# Patient Record
Sex: Male | Born: 1935 | Race: White | Hispanic: No | Smoking: Former smoker
Health system: Southern US, Community
[De-identification: ages and names within clinical notes are randomized; demographics above are authoritative.]

## PROBLEM LIST (undated history)

## (undated) DIAGNOSIS — G4700 Insomnia, unspecified: Secondary | ICD-10-CM

## (undated) DIAGNOSIS — I739 Peripheral vascular disease, unspecified: Secondary | ICD-10-CM

## (undated) DIAGNOSIS — N183 Chronic kidney disease, stage 3 unspecified: Secondary | ICD-10-CM

## (undated) DIAGNOSIS — E785 Hyperlipidemia, unspecified: Secondary | ICD-10-CM

## (undated) DIAGNOSIS — I679 Cerebrovascular disease, unspecified: Secondary | ICD-10-CM

## (undated) DIAGNOSIS — N529 Male erectile dysfunction, unspecified: Secondary | ICD-10-CM

## (undated) DIAGNOSIS — I2510 Atherosclerotic heart disease of native coronary artery without angina pectoris: Secondary | ICD-10-CM

## (undated) DIAGNOSIS — Z8739 Personal history of other diseases of the musculoskeletal system and connective tissue: Secondary | ICD-10-CM

## (undated) DIAGNOSIS — C44311 Basal cell carcinoma of skin of nose: Secondary | ICD-10-CM

## (undated) DIAGNOSIS — I10 Essential (primary) hypertension: Secondary | ICD-10-CM

## (undated) DIAGNOSIS — R55 Syncope and collapse: Secondary | ICD-10-CM

## (undated) DIAGNOSIS — I219 Acute myocardial infarction, unspecified: Secondary | ICD-10-CM

## (undated) DIAGNOSIS — R0989 Other specified symptoms and signs involving the circulatory and respiratory systems: Secondary | ICD-10-CM

## (undated) DIAGNOSIS — I779 Disorder of arteries and arterioles, unspecified: Secondary | ICD-10-CM

## (undated) HISTORY — DX: Syncope and collapse: R55

## (undated) HISTORY — PX: APPENDECTOMY: SHX54

## (undated) HISTORY — PX: HERNIA REPAIR: SHX51

## (undated) HISTORY — PX: ABDOMINAL HERNIA REPAIR: SHX539

## (undated) HISTORY — PX: CAROTID ENDARTERECTOMY: SUR193

## (undated) HISTORY — DX: Male erectile dysfunction, unspecified: N52.9

## (undated) HISTORY — DX: Peripheral vascular disease, unspecified: I73.9

## (undated) HISTORY — DX: Cerebrovascular disease, unspecified: I67.9

## (undated) HISTORY — DX: Insomnia, unspecified: G47.00

## (undated) HISTORY — DX: Atherosclerotic heart disease of native coronary artery without angina pectoris: I25.10

## (undated) HISTORY — PX: CORONARY ANGIOPLASTY WITH STENT PLACEMENT: SHX49

---

## 1998-12-09 ENCOUNTER — Emergency Department (HOSPITAL_COMMUNITY): Admission: EM | Admit: 1998-12-09 | Discharge: 1998-12-09 | Payer: Self-pay | Admitting: Emergency Medicine

## 1998-12-10 ENCOUNTER — Encounter: Payer: Self-pay | Admitting: Emergency Medicine

## 2000-03-13 DIAGNOSIS — I219 Acute myocardial infarction, unspecified: Secondary | ICD-10-CM

## 2000-03-13 HISTORY — PX: CORONARY ARTERY BYPASS GRAFT: SHX141

## 2000-03-13 HISTORY — DX: Acute myocardial infarction, unspecified: I21.9

## 2000-06-14 ENCOUNTER — Encounter: Payer: Self-pay | Admitting: Emergency Medicine

## 2000-06-14 ENCOUNTER — Inpatient Hospital Stay (HOSPITAL_COMMUNITY): Admission: EM | Admit: 2000-06-14 | Discharge: 2000-06-17 | Payer: Self-pay | Admitting: Emergency Medicine

## 2000-10-23 ENCOUNTER — Inpatient Hospital Stay (HOSPITAL_COMMUNITY): Admission: AD | Admit: 2000-10-23 | Discharge: 2000-10-30 | Payer: Self-pay | Admitting: Internal Medicine

## 2000-10-23 ENCOUNTER — Encounter: Payer: Self-pay | Admitting: Internal Medicine

## 2000-10-25 ENCOUNTER — Encounter: Payer: Self-pay | Admitting: Surgery

## 2000-10-26 ENCOUNTER — Encounter: Payer: Self-pay | Admitting: Surgery

## 2000-10-27 ENCOUNTER — Encounter: Payer: Self-pay | Admitting: Surgery

## 2000-10-28 ENCOUNTER — Encounter: Payer: Self-pay | Admitting: Thoracic Surgery (Cardiothoracic Vascular Surgery)

## 2011-08-24 ENCOUNTER — Encounter (HOSPITAL_COMMUNITY): Payer: Self-pay | Admitting: *Deleted

## 2011-08-24 ENCOUNTER — Emergency Department (HOSPITAL_COMMUNITY): Payer: Medicare Other

## 2011-08-24 ENCOUNTER — Inpatient Hospital Stay (HOSPITAL_COMMUNITY)
Admission: EM | Admit: 2011-08-24 | Discharge: 2011-08-28 | DRG: 372 | Disposition: A | Discharge status: Home / Self Care | Payer: Medicare Other | Attending: Internal Medicine | Admitting: Internal Medicine

## 2011-08-24 DIAGNOSIS — N179 Acute kidney failure, unspecified: Secondary | ICD-10-CM

## 2011-08-24 DIAGNOSIS — E876 Hypokalemia: Secondary | ICD-10-CM | POA: Diagnosis present

## 2011-08-24 DIAGNOSIS — Z7982 Long term (current) use of aspirin: Secondary | ICD-10-CM

## 2011-08-24 DIAGNOSIS — Z87891 Personal history of nicotine dependence: Secondary | ICD-10-CM

## 2011-08-24 DIAGNOSIS — I959 Hypotension, unspecified: Secondary | ICD-10-CM

## 2011-08-24 DIAGNOSIS — A0472 Enterocolitis due to Clostridium difficile, not specified as recurrent: Principal | ICD-10-CM | POA: Diagnosis present

## 2011-08-24 DIAGNOSIS — Z951 Presence of aortocoronary bypass graft: Secondary | ICD-10-CM

## 2011-08-24 DIAGNOSIS — R112 Nausea with vomiting, unspecified: Secondary | ICD-10-CM | POA: Diagnosis present

## 2011-08-24 DIAGNOSIS — E860 Dehydration: Secondary | ICD-10-CM | POA: Diagnosis present

## 2011-08-24 DIAGNOSIS — Z79899 Other long term (current) drug therapy: Secondary | ICD-10-CM

## 2011-08-24 DIAGNOSIS — D649 Anemia, unspecified: Secondary | ICD-10-CM | POA: Diagnosis present

## 2011-08-24 DIAGNOSIS — K529 Noninfective gastroenteritis and colitis, unspecified: Secondary | ICD-10-CM

## 2011-08-24 DIAGNOSIS — I2510 Atherosclerotic heart disease of native coronary artery without angina pectoris: Secondary | ICD-10-CM | POA: Diagnosis present

## 2011-08-24 DIAGNOSIS — R197 Diarrhea, unspecified: Secondary | ICD-10-CM | POA: Diagnosis present

## 2011-08-24 DIAGNOSIS — E785 Hyperlipidemia, unspecified: Secondary | ICD-10-CM | POA: Diagnosis present

## 2011-08-24 DIAGNOSIS — I10 Essential (primary) hypertension: Secondary | ICD-10-CM | POA: Diagnosis present

## 2011-08-24 HISTORY — DX: Hyperlipidemia, unspecified: E78.5

## 2011-08-24 HISTORY — DX: Essential (primary) hypertension: I10

## 2011-08-24 LAB — CBC
HCT: 33.3 % — ABNORMAL LOW (ref 39.0–52.0)
MCH: 31.5 pg (ref 26.0–34.0)
MCHC: 33 g/dL (ref 30.0–36.0)
MCV: 95.4 fL (ref 78.0–100.0)
RDW: 13.5 % (ref 11.5–15.5)

## 2011-08-24 LAB — AMYLASE: Amylase: 74 U/L (ref 0–105)

## 2011-08-24 LAB — URINALYSIS, ROUTINE W REFLEX MICROSCOPIC
Nitrite: NEGATIVE
Specific Gravity, Urine: 1.02 (ref 1.005–1.030)
Urobilinogen, UA: 0.2 mg/dL (ref 0.0–1.0)

## 2011-08-24 LAB — LIPASE, BLOOD: Lipase: 16 U/L (ref 11–59)

## 2011-08-24 LAB — BASIC METABOLIC PANEL
BUN: 31 mg/dL — ABNORMAL HIGH (ref 6–23)
Chloride: 104 mEq/L (ref 96–112)
Creatinine, Ser: 2.41 mg/dL — ABNORMAL HIGH (ref 0.50–1.35)
GFR calc non Af Amer: 23 mL/min — ABNORMAL LOW (ref >90)
Glucose, Bld: 139 mg/dL — ABNORMAL HIGH (ref 70–99)
Sodium: 136 mEq/L (ref 135–145)

## 2011-08-24 LAB — SODIUM, URINE, RANDOM: Sodium, Ur: 26 mEq/L

## 2011-08-24 LAB — COMPREHENSIVE METABOLIC PANEL
Albumin: 2.8 g/dL — ABNORMAL LOW (ref 3.5–5.2)
BUN: 38 mg/dL — ABNORMAL HIGH (ref 6–23)
Chloride: 99 mEq/L (ref 96–112)
Creatinine, Ser: 3.45 mg/dL — ABNORMAL HIGH (ref 0.50–1.35)
Total Bilirubin: 0.4 mg/dL (ref 0.3–1.2)
Total Protein: 6.5 g/dL (ref 6.0–8.3)

## 2011-08-24 MED ORDER — SODIUM CHLORIDE 0.9 % IV BOLUS (SEPSIS)
1000.0000 mL | Freq: Once | INTRAVENOUS | Status: AC
  Administered 2011-08-24: 1000 mL via INTRAVENOUS

## 2011-08-24 MED ORDER — ALBUTEROL SULFATE (5 MG/ML) 0.5% IN NEBU: 2.5000 mg | INHALATION_SOLUTION | RESPIRATORY_TRACT | Status: DC | PRN

## 2011-08-24 MED ORDER — SODIUM CHLORIDE 0.9 % IJ SOLN
3.0000 mL | Freq: Two times a day (BID) | INTRAMUSCULAR | Status: DC
  Administered 2011-08-24 – 2011-08-27 (×5): 3 mL via INTRAVENOUS

## 2011-08-24 MED ORDER — ONDANSETRON HCL 4 MG/2ML IJ SOLN
4.0000 mg | Freq: Once | INTRAMUSCULAR | Status: AC
  Administered 2011-08-24: 4 mg via INTRAVENOUS
  Filled 2011-08-24: qty 2

## 2011-08-24 MED ORDER — HEPARIN SODIUM (PORCINE) 5000 UNIT/ML IJ SOLN
5000.0000 [IU] | Freq: Three times a day (TID) | INTRAMUSCULAR | Status: DC
  Administered 2011-08-24 – 2011-08-28 (×11): 5000 [IU] via SUBCUTANEOUS
  Filled 2011-08-24 (×15): qty 1

## 2011-08-24 MED ORDER — ATORVASTATIN CALCIUM 40 MG PO TABS
40.0000 mg | ORAL_TABLET | Freq: Every day | ORAL | Status: DC
  Administered 2011-08-24 – 2011-08-27 (×4): 40 mg via ORAL
  Filled 2011-08-24 (×5): qty 1

## 2011-08-24 MED ORDER — ASPIRIN EC 81 MG PO TBEC
81.0000 mg | DELAYED_RELEASE_TABLET | Freq: Every day | ORAL | Status: DC
  Administered 2011-08-25 – 2011-08-27 (×3): 81 mg via ORAL
  Filled 2011-08-24 (×4): qty 1

## 2011-08-24 MED ORDER — SODIUM CHLORIDE 0.9 % IV SOLN
INTRAVENOUS | Status: DC
  Administered 2011-08-24 – 2011-08-27 (×7): via INTRAVENOUS

## 2011-08-24 MED ORDER — NITROGLYCERIN 0.4 MG SL SUBL: 0.4000 mg | SUBLINGUAL_TABLET | SUBLINGUAL | Status: DC | PRN

## 2011-08-24 MED ORDER — ONDANSETRON HCL 4 MG PO TABS: 4.0000 mg | ORAL_TABLET | Freq: Four times a day (QID) | ORAL | Status: DC | PRN

## 2011-08-24 MED ORDER — SODIUM CHLORIDE 0.9 % IV BOLUS (SEPSIS): 1000.0000 mL | Freq: Once | INTRAVENOUS | Status: DC

## 2011-08-24 MED ORDER — ACETAMINOPHEN 650 MG RE SUPP: 650.0000 mg | Freq: Four times a day (QID) | RECTAL | Status: DC | PRN

## 2011-08-24 MED ORDER — POTASSIUM CHLORIDE CRYS ER 20 MEQ PO TBCR
20.0000 meq | EXTENDED_RELEASE_TABLET | Freq: Once | ORAL | Status: AC
  Administered 2011-08-27: 20 meq via ORAL
  Filled 2011-08-24 (×2): qty 1

## 2011-08-24 MED ORDER — ONDANSETRON HCL 4 MG/2ML IJ SOLN: 4.0000 mg | Freq: Four times a day (QID) | INTRAMUSCULAR | Status: DC | PRN

## 2011-08-24 MED ORDER — ACETAMINOPHEN 325 MG PO TABS: 650.0000 mg | ORAL_TABLET | Freq: Four times a day (QID) | ORAL | Status: DC | PRN

## 2011-08-24 NOTE — ED Notes (Signed)
Dr. Christy Gentles aware of bp 78/46. 3rd liter of fluid ordered. Patient placed in trendelenburg. Will hang once second liter is complete.

## 2011-08-24 NOTE — ED Provider Notes (Signed)
History     CSN: [id number]  Arrival date & time 08/24/11  1150   First MD Initiated Contact with Patient 08/24/11 1245      Chief Complaint  Patient presents with  . Nausea  . Emesis  . Dizziness     Patient is a 78 y.o. male presenting with vomiting. The history is provided by the patient.  Emesis  This is a new problem. The current episode started more than 2 days ago. The problem occurs 2 to 4 times per day. The problem has been gradually worsening. The emesis has an appearance of stomach contents. There has been no fever. Associated symptoms include abdominal pain, chills and diarrhea. Pertinent negatives include no fever.  eating worsens vomiting Nothing improves symptoms  Pt presents from home for vomiting and diarrhea (nonbloody) for past 2-3 days No cp/sob Also reports abdominal cramping No fever No syncope but does report dizziness Unknown cause of his symptoms  PCP - Dr Laurann Montana  Past Medical History  Diagnosis Date  . Hypertension     Surgical history - CABG  History reviewed. No pertinent family history.  History  Substance Use Topics  . Smoking status: Never Smoker   . Smokeless tobacco: Not on file  . Alcohol Use: No      Review of Systems  Constitutional: Positive for chills. Negative for fever.  Respiratory: Negative for shortness of breath.   Gastrointestinal: Positive for vomiting, abdominal pain and diarrhea.  Skin: Negative for rash.  Neurological: Positive for weakness.  All other systems reviewed and are negative.    Allergies  Review of patient's allergies indicates no known allergies.  Home Medications   Current Outpatient Rx  Name Route Sig Dispense Refill  . AMITRIPTYLINE HCL 25 MG PO TABS Oral Take 25 mg by mouth at bedtime.    Marland Kitchen AMLODIPINE BESYLATE 10 MG PO TABS Oral Take 10 mg by mouth daily.    . ASPIRIN EC 81 MG PO TBEC Oral Take 81 mg by mouth daily.    . ATORVASTATIN CALCIUM 40 MG PO TABS Oral Take 40 mg by mouth  daily.    Marland Kitchen LISINOPRIL 20 MG PO TABS Oral Take 20 mg by mouth daily.    Marland Kitchen METOPROLOL TARTRATE 50 MG PO TABS Oral Take 50 mg by mouth 2 (two) times daily.    Marland Kitchen NITROGLYCERIN 0.4 MG SL SUBL Sublingual Place 0.4 mg under the tongue every 5 (five) minutes as needed. For cheat pain.      BP 91/31  Pulse 67  Temp 98 F (36.7 C) (Oral)  Resp 18  SpO2 100%  Physical Exam CONSTITUTIONAL: Well developed/well nourished HEAD AND FACE: Normocephalic/atraumatic EYES: EOMI/PERRL, no icterus ENMT: Mucous membranes dry NECK: supple no meningeal signs SPINE:entire spine nontender CV: S1/S2 noted, no murmurs/rubs/gallops noted LUNGS: Lungs are clear to auscultation bilaterally, no apparent distress ABDOMEN: soft, nontender, no rebound or guarding GU:no cva tenderness NEURO: Pt is awake/alert, moves all extremitiesx4 EXTREMITIES: pulses normal, full ROM SKIN: warm, color normal PSYCH: no abnormalities of mood noted  ED Course  Procedures   CRITICAL CARE Performed by: Sharyon Cable   Total critical care time: 45  Critical care time was exclusive of separately billable procedures and treating other patients.  Critical care was necessary to treat or prevent imminent or life-threatening deterioration.  Critical care was time spent personally by me on the following activities: development of treatment plan with patient and/or surrogate as well as nursing, discussions with consultants, evaluation of  patient's response to treatment, examination of patient, obtaining history from patient or surrogate, ordering and performing treatments and interventions, ordering and review of laboratory studies, ordering and review of radiographic studies, pulse oximetry and re-evaluation of patient's condition.   Labs Reviewed  CBC - Abnormal; Notable for the following:    WBC 11.7 (*)     RBC 3.49 (*)     Hemoglobin 11.0 (*)     HCT 33.3 (*)     All other components within normal limits  COMPREHENSIVE  METABOLIC PANEL - Abnormal; Notable for the following:    Potassium 3.3 (*)     Glucose, Bld 159 (*)     BUN 38 (*)     Creatinine, Ser 3.45 (*)     Calcium 8.2 (*)     Albumin 2.8 (*)     GFR calc non Af Amer 15 (*)     GFR calc Af Amer 18 (*)     All other components within normal limits  AMYLASE  LIPASE, BLOOD  URINALYSIS, ROUTINE W REFLEX MICROSCOPIC  LACTIC ACID, PLASMA    1:50 PM Pt with recent vomiting/diarrhea now hypotensive and appears dehydrated Abdomen soft on my exam Will follow closely 3:29 PM Pt had one drop in BP to 70s, I then repeated 3 separate pressures >97 He is awake/alert.  His abdomen soft and no tenderness He has had vomiting and diarrhea.  He reports that he has continued to take his BP meds.  Suspect his low bp is combination of dehydration and also his BP meds.  I doubt acute abdomen.  He reports that he has had some SOB recently prior to today, and coarse BS noted in bases (no rales) will get CXR. He is on his third liter of IV fluids He is nontoxic  Will call for admission 4:21 PM D/w dr Algis Liming, to admit to stepdown for renal failure/dehydration Pt stabilized in the ED On my last check SBP >100 He is awake/alert nontoxic in appearance Do not feel imaging of abdomen is warranted  MDM  Nursing notes including past medical history and social history reviewed and considered in documentation All labs/vitals reviewed and considered xrays reviewed and considered        Date: 08/24/2011  Rate: 70  Rhythm: normal sinus rhythm  QRS Axis: normal  Intervals: normal  ST/T Wave abnormalities: nonspecific ST changes  Conduction Disutrbances:none     Sharyon Cable, MD 08/24/11 1623

## 2011-08-24 NOTE — ED Notes (Signed)
Pt reports n/v/d since Sunday associated with dizziness. Report this am blood pressure systolic was in the 0000000.

## 2011-08-24 NOTE — H&P (Addendum)
PCP:   Danny Shelling, MD   Chief Complaint:  Nausea, vomiting and diarrhea.  HPI: 78 year old male Danny [name] with history of hypertension, hyperlipidemia, CAD status post CABG, bilateral carotid endarterectomy who lives independently and is fairly active presents with four-day history of nausea, vomiting and diarrhea. He indicates that he was in his usual state of health until Sunday night when he started having subacute onset of nausea, vomiting and diarrhea. Since then he has had multiple episodes daily. He indicates up to 10 times of emesis and diarrhea every day. The vomitus consists of brown-colored liquid without coffee ground or blood. Decreased appetite and has barely been able to take anything by mouth. Last time he vomited was yesterday. He has been able to consume sips of liquids today. Intermittent lower abdominal mild pain which has resolved. Stools are liquid brown in color without mucus or blood in not foul-smelling. No chills and may have had low-grade fevers but did not check temperatures. His urine output he says was okay until today when he noticed decreased urine output. He had not taken his medications for the first 2 days of symptoms but took his antihypertensive medications yesterday. He complains of some dizziness on ambulation but has not had any falls or passing out episodes. He initially thought that his symptoms would spontaneously subside because of unrelenting nature of his symptoms he presented to the emergency department. On initial evaluation he was found to have hypotension with systolic blood pressure in the 70s, acute renal failure with creatinine of 3.45. He was aggressively resuscitated and has received 3 L of IV crystalloids that his blood pressures have improved to systolic of [id number] mm of mercury. He has voided 200 mL of concentrated urine. The hospitalist service is requested to admit for further evaluation and management.  Danny [name] was recently treated with a  course of clindamycin for infected left paravertebral and for which she had incision and drainage. He completed this approximately 10 days ago. He denies any sickly contacts with similar symptoms or eating anything unusual.  Past Medical History: Past Medical History  Diagnosis Date  . Hypertension   . Hyperlipidemia     Past Surgical History: Past Surgical History  Procedure Date  . Coronary artery bypass graft   . Hernia repair   . Salivary gland surgery   . Carotid endarterectomy bilateral    Allergies:  No Known Allergies  Medications: Prior to Admission medications   Medication Sig Start Date End Date Taking? Authorizing Provider  amitriptyline (ELAVIL) 25 MG tablet Take 25 mg by mouth at bedtime.   Yes Historical Provider, MD  amLODipine (NORVASC) 10 MG tablet Take 10 mg by mouth daily.   Yes Historical Provider, MD  aspirin EC 81 MG tablet Take 81 mg by mouth daily.   Yes Historical Provider, MD  atorvastatin (LIPITOR) 40 MG tablet Take 40 mg by mouth daily.   Yes Historical Provider, MD  lisinopril (PRINIVIL,ZESTRIL) 20 MG tablet Take 20 mg by mouth daily.   Yes Historical Provider, MD  metoprolol (LOPRESSOR) 50 MG tablet Take 50 mg by mouth 2 (two) times daily.   Yes Historical Provider, MD  nitroGLYCERIN (NITROSTAT) 0.4 MG SL tablet Place 0.4 mg under the tongue every 5 (five) minutes as needed. For cheat pain.   Yes Historical Provider, MD    Family History: History reviewed. No pertinent family history.strong family history of heart disease, most of whom have demised.   Social History:  reports that he has quit smoking. He  does not have any smokeless tobacco history on file. He reports that he does not drink alcohol or use illicit drugs.  Danny [name] lives alone and is independent of activities of daily living.   Review of Systems:  all systems reviewed and apart from history of presenting illness, are negative. Danny [name] denies any further pain in the left salivary  gland area. No chest pain, dyspnea or palpitations. No cough, earache or sore throat or headache. No dysuria or urinary frequency. No open wounds. Danny [name] has chronic right hip pain.   Physical Exam: Filed Vitals:   08/24/11 1330 08/24/11 1415 08/24/11 1430 08/24/11 1445  BP: 91/31 103/34 97/33 78/46   Pulse: 67 68 66 66  Temp:      TempSrc:      Resp: 18 16 17 16   SpO2: 100% 98% 98% 100%   General appearance: Moderately built and nourished  male Danny [name] who is lying comfortably in the gurney and is in no obvious distress.  Does not look toxic.  Head: Nontraumatic and normocephalic.  Eyes: Pupils equally reacting to light and accommodation.Bilateral immature cataracts.  Ears: Normal  Nose: No acute findings. No sinus tenderness.  Throat: Mucosa is dry . No oral thrush.  Neck: Supple. No JVD or carotid bruit. Lymph nodes: No lymphadenopathy.  Resp: Clear to auscultation. No increased work of breathing.  Cardio: First and second heart sounds heard, regular rate and rythm. No murmurs or JVD or gallop or pedal edema.   GI: Non distended. Soft and nontender. No organomegaly or masses appreciated. Normal bowel sounds heard.  Extremities: symmetric 5/5 power. Skin: No other acute findings.  Neurologic: Alert and oriented. No focal neurological deficits.   Labs on Admission:   [facility] 08/24/11 1234  NA 136  K 3.3*  CL 99  CO2 21  GLUCOSE 159*  BUN Danny*  CREATININE 3.45*  CALCIUM 8.2*  MG --  PHOS --    Basename 08/24/11 1234  AST 28  ALT 25  ALKPHOS 102  BILITOT 0.4  PROT 6.5  ALBUMIN 2.8*    Basename 08/24/11 1234  LIPASE 16  AMYLASE 74    Basename 08/24/11 1234  WBC 11.7*  NEUTROABS --  HGB 11.0*  HCT 33.3*  MCV 95.4  PLT 188   No results found for this basename: CKTOTAL:3,CKMB:3,CKMBINDEX:3,TROPONINI:3 in the last 72 hours No results found for this basename: TSH,T4TOTAL,FREET3,T3FREE,THYROIDAB in the last 72 hours No results found for this basename:  VITAMINB12:2,FOLATE:2,FERRITIN:2,TIBC:2,IRON:2,RETICCTPCT:2 in the last 72 hours  Radiological Exams on Admission: Dg Chest Port 1 View  08/24/2011  *RADIOLOGY REPORT*  Clinical Data: Vomiting and diarrhea.  PORTABLE CHEST - 1 VIEW  Comparison: None.  Findings: Patchy left basilar airspace disease is identified. Right lung is clear.  Heart size upper normal.  The Danny [name] is status post CABG.  Degenerative disease about the right shoulder noted.  IMPRESSION: Patchy left basilar airspace disease is likely due to atelectasis rather than pneumonia.  Original Report Authenticated By: Arvid Right. Luther Parody, M.D.      Assessment/Plan Present on Admission:  .Nausea, vomiting and diarrhea .Dehydration .Hypotension .Acute renal failure .Hypokalemia .Anemia  1. Nausea, vomiting and diarrhea: Possibly acute viral gastroenteritis-already seems to be improving. Rule out C. difficile infection given history of recent antibiotic exposure. Admit to step down unit. Start clear liquids and advance diet as tolerated. Placed on contact isolation and check for C. difficile PCR. If C. difficile PCR negative, discontinue isolation. 2. Dehydration: Secondary to problem #1. Aggressive IV fluid hydration and  monitor. 3. Hypotension: Secondary to dehydration and antihypertensive medications. Temporarily hold BP medications. Improving. Continue IV fluids and monitor. 4. Acute renal failure: Do not know Danny [name]'s baseline renal functions as there are no prior lab data to compare. Strict input output. Urine sodium and creatinine and urine analysis. Aggressive IV fluid hydration. Monitor daily BMP. Differential diagnosis includes dehydration, ATN secondary to hypotension complicated by ACE inhibitors. Temporarily hold lisinopril. Expect recovery. If renal functions do not improve then consider renal ultrasound to rule out obstruction. Will not place a Foley catheter since Danny [name] is voiding spontaneously. 5. Anemia: Do not know  baseline hemoglobin. Follow CBC tomorrow. 6. Hypokalemia: Replete and follow daily BMP. 7. History of coronary artery disease status post CABG: Asymptomatic of chest pain or dyspnea. Continue aspirin. 8. Full code. This was confirmed with the Danny [name] in the presence of his daughter.   Discussed Danny [name]'s care at length with the Danny [name] and his daughter Ms. Lilla Shook who is at the bedside.  FENA is 0.23% suggesting prerenal cause for acute renal failure.  Mickal Meno 08/24/2011, 6:00 PM

## 2011-08-24 NOTE — ED Notes (Signed)
Admitting doctor here to see.  Report called but unable to take now

## 2011-08-24 NOTE — ED Notes (Signed)
Pt alert  Male wants to know if he is getting his chest xray here

## 2011-08-25 LAB — DIFFERENTIAL
Eosinophils Relative: 3 % (ref 0–5)
Lymphocytes Relative: 12 % (ref 12–46)
Lymphs Abs: 1.1 10*3/uL (ref 0.7–4.0)
Monocytes Relative: 13 % — ABNORMAL HIGH (ref 3–12)

## 2011-08-25 LAB — CBC
Hemoglobin: 10 g/dL — ABNORMAL LOW (ref 13.0–17.0)
MCV: 94.6 fL (ref 78.0–100.0)
Platelets: 151 10*3/uL (ref 150–400)
Platelets: 161 10*3/uL (ref 150–400)
RBC: 3.28 MIL/uL — ABNORMAL LOW (ref 4.22–5.81)
RBC: 3.13 MIL/uL — ABNORMAL LOW (ref 4.22–5.81)
RDW: 13.5 % (ref 11.5–15.5)
WBC: 11 10*3/uL — ABNORMAL HIGH (ref 4.0–10.5)
WBC: 9.7 10*3/uL (ref 4.0–10.5)

## 2011-08-25 LAB — COMPREHENSIVE METABOLIC PANEL
Alkaline Phosphatase: 61 U/L (ref 39–117)
BUN: 26 mg/dL — ABNORMAL HIGH (ref 6–23)
CO2: 17 mEq/L — ABNORMAL LOW (ref 19–32)
Chloride: 106 mEq/L (ref 96–112)
Creatinine, Ser: 2.06 mg/dL — ABNORMAL HIGH (ref 0.50–1.35)
GFR calc Af Amer: 33 mL/min — ABNORMAL LOW (ref >90)
GFR calc non Af Amer: 28 mL/min — ABNORMAL LOW (ref >90)
Glucose, Bld: 105 mg/dL — ABNORMAL HIGH (ref 70–99)
Total Bilirubin: 0.4 mg/dL (ref 0.3–1.2)

## 2011-08-25 MED ORDER — METRONIDAZOLE 500 MG PO TABS
500.0000 mg | ORAL_TABLET | Freq: Three times a day (TID) | ORAL | Status: DC
  Administered 2011-08-25 – 2011-08-28 (×9): 500 mg via ORAL
  Filled 2011-08-25 (×13): qty 1

## 2011-08-25 MED ORDER — POTASSIUM CHLORIDE CRYS ER 20 MEQ PO TBCR
20.0000 meq | EXTENDED_RELEASE_TABLET | Freq: Three times a day (TID) | ORAL | Status: DC
  Administered 2011-08-25 – 2011-08-27 (×7): 20 meq via ORAL
  Filled 2011-08-25 (×8): qty 1

## 2011-08-25 MED ORDER — METOPROLOL TARTRATE 25 MG PO TABS
25.0000 mg | ORAL_TABLET | Freq: Two times a day (BID) | ORAL | Status: DC
  Administered 2011-08-25 – 2011-08-27 (×6): 25 mg via ORAL
  Filled 2011-08-25 (×8): qty 1

## 2011-08-25 MED ORDER — POTASSIUM CHLORIDE 10 MEQ/100ML IV SOLN
10.0000 meq | INTRAVENOUS | Status: AC
Start: 2011-08-25 — End: 2011-08-25
  Administered 2011-08-25 (×2): 10 meq via INTRAVENOUS
  Filled 2011-08-25: qty 200

## 2011-08-25 MED ORDER — LOPERAMIDE HCL 2 MG PO CAPS
2.0000 mg | ORAL_CAPSULE | Freq: Three times a day (TID) | ORAL | Status: DC | PRN
  Filled 2011-08-25: qty 1

## 2011-08-25 NOTE — Progress Notes (Signed)
Utilization review completed.  

## 2011-08-25 NOTE — Progress Notes (Signed)
Subjective: Still having diarrhea, no vomiting  Objective: Vital signs in last 24 hours: Temp:  [98 F (36.7 C)-98.9 F (37.2 C)] 98.6 F (37 C) (06/14 0734) Pulse Rate:  [66-102] 101  (06/14 0349) Resp:  [16-27] 20  (06/14 0349) BP: (78-150)/(27-66) 150/45 mmHg (06/14 0349) SpO2:  [95 %-100 %] 98 % (06/14 0349) FiO2 (%):  [2 %] 2 % (06/13 1745) Weight:  [72.8 kg (160 lb 7.9 oz)-73.9 kg (162 lb 14.7 oz)] 73.9 kg (162 lb 14.7 oz) (06/14 0441) Weight change:  Last BM Date: 08/24/11  Intake/Output from previous day: 06/13 0701 - 06/14 0700 In: 2163 [I.V.:2163] Out: 2000 [Urine:2000] Intake/Output this shift:    General appearance: alert and cooperative Resp: clear to auscultation bilaterally Cardio: regular rate and rhythm, S1, S2 normal, no murmur, click, rub or gallop GI: soft, non-tender; bowel sounds normal; no masses,  no organomegaly  Lab Results:  [facility] 08/25/11 0352 08/24/11 1234  WBC 11.0* 11.7*  HGB 10.4* 11.0*  HCT 31.6* 33.3*  PLT 151 188   BMET  Basename 08/25/11 0352 08/24/11 2044  NA 138 136  K 3.2* 3.1*  CL 106 104  CO2 17* 20  GLUCOSE 105* 139*  BUN 26* 31*  CREATININE 2.06* 2.41*  CALCIUM 8.2* 7.8*    Studies/Results: Dg Chest Port 1 View  08/24/2011  *RADIOLOGY REPORT*  Clinical Data: Vomiting and diarrhea.  PORTABLE CHEST - 1 VIEW  Comparison: None.  Findings: Patchy left basilar airspace disease is identified. Right lung is clear.  Heart size upper normal.  The patient is status post CABG.  Degenerative disease about the right shoulder noted.  IMPRESSION: Patchy left basilar airspace disease is likely due to atelectasis rather than pneumonia.  Original Report Authenticated By: Arvid Right. Luther Parody, M.D.    Medications: I have reviewed the patient's current medications.  Assessment/Plan: Principal Problem:  Acute Gastroenteritis  Supportive care Active Problems:  Hypotension improved with IVFs, holding meds  Acute renal failure  improving  Hypokalemia replete  Anemia follow  Hypertension  Restart Beta blocker, hold ACEI   LOS: 1 day   Mirelle Biskup JOSEPH 08/25/2011, 7:55 AM

## 2011-08-26 DIAGNOSIS — A0472 Enterocolitis due to Clostridium difficile, not specified as recurrent: Secondary | ICD-10-CM

## 2011-08-26 LAB — BASIC METABOLIC PANEL
CO2: 21 mEq/L (ref 19–32)
Calcium: 8.2 mg/dL — ABNORMAL LOW (ref 8.4–10.5)
Glucose, Bld: 99 mg/dL (ref 70–99)
Potassium: 3.4 mEq/L — ABNORMAL LOW (ref 3.5–5.1)
Sodium: 138 mEq/L (ref 135–145)

## 2011-08-26 MED ORDER — AMITRIPTYLINE HCL 25 MG PO TABS
25.0000 mg | ORAL_TABLET | Freq: Every day | ORAL | Status: DC
  Administered 2011-08-26 – 2011-08-27 (×2): 25 mg via ORAL
  Filled 2011-08-26 (×4): qty 1

## 2011-08-26 NOTE — Progress Notes (Signed)
Subjective: Less diarrhea, feeling better.  Objective: Vital signs in last 24 hours: Temp:  [97.5 F (36.4 C)-98.6 F (37 C)] 98.3 F (36.8 C) (06/15 0749) Pulse Rate:  [68-84] 74  (06/15 0749) Resp:  [16-19] 16  (06/15 0749) BP: (101-146)/(35-76) 113/46 mmHg (06/15 0749) SpO2:  [96 %-99 %] 96 % (06/15 0749) Weight:  [74.6 kg (164 lb 7.4 oz)] 74.6 kg (164 lb 7.4 oz) (06/15 0500) Weight change: 1.8 kg (3 lb 15.5 oz) Last BM Date: 08/25/11  Intake/Output from previous day: 06/14 0701 - 06/15 0700 In: 3288 [P.O.:360; I.V.:2928] Out: 1526 [Urine:1526] Intake/Output this shift: Total I/O In: 245 [P.O.:120; I.V.:125] Out: 300 [Urine:300]  General appearance: alert and cooperative Resp: clear to auscultation bilaterally Cardio: regular rate and rhythm, S1, S2 normal, no murmur, click, rub or gallop GI: soft, non-tender; bowel sounds normal; no masses,  no organomegaly Extremities: extremities normal, atraumatic, no cyanosis or edema  Lab Results:  [facility] 08/25/11 0915 08/25/11 0352  WBC 9.7 11.0*  HGB 10.0* 10.4*  HCT 29.6* 31.6*  PLT 161 151   BMET  Basename 08/26/11 0446 08/25/11 0352  NA 138 138  K 3.4* 3.2*  CL 108 106  CO2 21 17*  GLUCOSE 99 105*  BUN 13 26*  CREATININE 1.37* 2.06*  CALCIUM 8.2* 8.2*    Studies/Results: Dg Chest Port 1 View  08/24/2011  *RADIOLOGY REPORT*  Clinical Data: Vomiting and diarrhea.  PORTABLE CHEST - 1 VIEW  Comparison: None.  Findings: Patchy left basilar airspace disease is identified. Right lung is clear.  Heart size upper normal.  The patient is status post CABG.  Degenerative disease about the right shoulder noted.  IMPRESSION: Patchy left basilar airspace disease is likely due to atelectasis rather than pneumonia.  Original Report Authenticated By: Arvid Right. Luther Parody, M.D.    Medications: I have reviewed the patient's current medications.  Assessment/Plan: Principal Problem:  *C. difficile colitis improved with flagyl.  Transfer to floor, advance diet.  Ask PT to evaluate Active Problems:  Hypotension resolved.  BP meds on hold  Acute renal failure improving, decrease IVFs  Hypokalemia replete  Anemia stable   LOS: 2 days   Danny [name] JOSEPH 08/26/2011, 11:23 AM

## 2011-08-26 NOTE — Progress Notes (Signed)
Pt transferred to [place], per MD order. Report called to receiving nurse and all questions answered.

## 2011-08-27 LAB — BASIC METABOLIC PANEL
BUN: 8 mg/dL (ref 6–23)
CO2: 20 mEq/L (ref 19–32)
Calcium: 8.3 mg/dL — ABNORMAL LOW (ref 8.4–10.5)
Chloride: 110 mEq/L (ref 96–112)
Creatinine, Ser: 1.19 mg/dL (ref 0.50–1.35)
GFR calc Af Amer: 64 mL/min — ABNORMAL LOW (ref >90)
GFR calc non Af Amer: 55 mL/min — ABNORMAL LOW (ref >90)
Glucose, Bld: 97 mg/dL (ref 70–99)
Potassium: 3.8 mEq/L (ref 3.5–5.1)
Sodium: 141 mEq/L (ref 135–145)

## 2011-08-27 MED ORDER — OXYCODONE HCL 5 MG PO TABS: 5.0000 mg | ORAL_TABLET | ORAL | Status: DC | PRN

## 2011-08-27 MED ORDER — AMLODIPINE BESYLATE 10 MG PO TABS
10.0000 mg | ORAL_TABLET | Freq: Every day | ORAL | Status: DC
  Administered 2011-08-27: 10 mg via ORAL
  Filled 2011-08-27 (×2): qty 1

## 2011-08-27 MED ORDER — POTASSIUM CHLORIDE CRYS ER 20 MEQ PO TBCR
20.0000 meq | EXTENDED_RELEASE_TABLET | Freq: Two times a day (BID) | ORAL | Status: DC
  Filled 2011-08-27 (×2): qty 1

## 2011-08-27 NOTE — Progress Notes (Signed)
Physical Therapy Evaluation Patient Details Name: Danny [name] MRN: [id number] DOB: [birth year] Today's Date: 08/27/2011 Time: [id number] PT Time Calculation (min): 23 min  PT Assessment / Plan / Recommendation Clinical Impression  78 yo male admitted with nausea/vomitting presents with decreased activity tolerance; Independent and enjoyed working outdoors prior to falling ill; Should progress well as he improves medically; Will benefit form PT to increase activity tolerance, discern best assistive device for amb, if needed, and perform stair training; Recommend pt walk the hallways with staff at least 2x/day    PT Assessment  Patient needs continued PT services    Follow Up Recommendations  Home health PT;Supervision - Intermittent    Barriers to Discharge Decreased caregiver support Must be modI to dc home    lEquipment Recommendations  Cane (possibly cane; will discern over ensuing PT sessions)    Recommendations for Other Services     Frequency Min 3X/week    Precautions / Restrictions Precautions Precautions: Fall (slight fall risk; improves greatly with UE support on IV pol)   Pertinent Vitals/Pain no apparent distress       Mobility  Bed Mobility Bed Mobility: Supine to Sit;Sitting - Scoot to Edge of Bed Supine to Sit: 5: Supervision Sitting - Scoot to [employer] of Bed: 5: Supervision Details for Bed Mobility Assistance: Overall smooth transition; up to right side of bed of approximate home; cue for IV line awareness Transfers Transfers: Sit to Stand;Stand to Sit Sit to Stand: 4: Min guard;From bed (without physical contact) Stand to Sit: 4: Min guard;To chair/3-in-1;With upper extremity assist;With armrests (without physical contact) Details for Transfer Assistance: cues for safety, technique Ambulation/Gait Ambulation/Gait Assistance: 4: Min guard (without physical contact) Ambulation Distance (Feet): 200 Feet Assistive device: Other (Comment) (Unilateral UE support  pushing IV pole) Ambulation/Gait Assistance Details: Initially walked witout UE support; noted pt is tending to reach out for UE support; Improved gait pattern with R UE holding IV pole; Worth considering cane next session Gait Pattern: Decreased stride length;Trunk flexed (upper trunk flexed) Gait velocity: slow General Gait Details: Should progress well as he continues to feel better/improve medically    Exercises     PT Diagnosis: Difficulty walking  PT Problem List: Decreased activity tolerance;Decreased balance;Decreased mobility;Decreased knowledge of use of DME PT Treatment Interventions: DME instruction;Gait training;Stair training;Functional mobility training;Therapeutic activities;Therapeutic exercise;Balance training;Patient/family education   PT Goals Acute Rehab PT Goals PT Goal Formulation: With patient Time For Goal Achievement: 09/10/11 Potential to Achieve Goals: Good Pt will go Supine/Side to Sit: Independently PT Goal: Supine/Side to Sit - Progress: Goal set today Pt will go Sit to Supine/Side: Independently PT Goal: Sit to Supine/Side - Progress: Goal set today Pt will go Sit to Stand: Independently PT Goal: Sit to Stand - Progress: Goal set today Pt will go Stand to Sit: Independently PT Goal: Stand to Sit - Progress: Goal set today Pt will Ambulate: >150 feet;with modified independence;with least restrictive assistive device PT Goal: Ambulate - Progress: Goal set today Pt will Go Up / Down Stairs: 3-5 stairs;with rail(s);with modified independence PT Goal: Up/Down Stairs - Progress: Goal set today  Visit Information  Last PT Received On: 08/27/11 Assistance Needed: +1    Subjective Data  Subjective: feeling "almost back to normal" Patient Stated Goal: Home; back to the garden and working outside   Prior Samson Lives With: Alone Available Help at Discharge: Family;Available PRN/intermittently (Step-daughter close, can help with  meals) Type of Home: House Home Access: Stairs to enter  Entrance Stairs-Number of Steps: 5 Entrance Stairs-Rails: Right;Left Home Layout: Two level;Able to live on main level with bedroom/bathroom Bathroom Shower/Tub: Tub/shower unit Home Adaptive Equipment: None Additional Comments: very independent Prior Function Level of Independence: Independent Able to Take Stairs?: Yes Driving: Yes Vocation: Retired Corporate investment banker: No difficulties    Cognition  Overall Cognitive Status: Appears within functional limits for tasks assessed/performed Arousal/Alertness: Awake/alert Orientation Level: Appears intact for tasks assessed Behavior During Session: [facility] for tasks performed    Extremity/Trunk Assessment Right Upper Extremity Assessment RUE ROM/Strength/Tone: [facility] for tasks assessed Left Upper Extremity Assessment LUE ROM/Strength/Tone: [facility] for tasks assessed Right Lower Extremity Assessment RLE ROM/Strength/Tone: [facility] for tasks assessed Left Lower Extremity Assessment LLE ROM/Strength/Tone: WFL for tasks assessed Trunk Assessment Trunk Assessment: Kyphotic   Balance    End of Session PT - End of Session Equipment Utilized During Treatment: Gait belt Activity Tolerance: Patient tolerated treatment well Patient left: in chair;with call bell/phone within reach (Breakfast setup for pt)   Danny [name], [place]  08/27/2011, 8:22 AM

## 2011-08-27 NOTE — Progress Notes (Signed)
Subjective: Feels much better, diarrhea resolving  Objective: Vital signs in last 24 hours: Temp:  [97.4 F (36.3 C)-98.7 F (37.1 C)] 98.7 F (37.1 C) (06/16 0900) Pulse Rate:  [67-91] 84  (06/16 0900) Resp:  [16-18] 18  (06/16 0900) BP: (101-177)/(54-65) 151/63 mmHg (06/16 0900) SpO2:  [97 %-99 %] 99 % (06/16 0900) Weight change:  Last BM Date: 08/26/11  Intake/Output from previous day: 06/15 0701 - 06/16 0700 In: 1802.5 [P.O.:120; I.V.:1682.5] Out: 300 [Urine:300] Intake/Output this shift: Total I/O In: 240 [P.O.:240] Out: -   General appearance: alert and cooperative Resp: clear to auscultation bilaterally Cardio: regular rate and rhythm, S1, S2 normal, no murmur, click, rub or gallop GI: soft, non-tender; bowel sounds normal; no masses,  no organomegaly  Lab Results:  [facility] 08/25/11 0915 08/25/11 0352  WBC 9.7 11.0*  HGB 10.0* 10.4*  HCT 29.6* 31.6*  PLT 161 151   BMET  Basename 08/27/11 0505 08/26/11 0446  NA 141 138  K 3.8 3.4*  CL 110 108  CO2 20 21  GLUCOSE 97 99  BUN 8 13  CREATININE 1.19 1.37*  CALCIUM 8.3* 8.2*    Studies/Results: No results found.  Medications: I have reviewed the patient's current medications.  Assessment/Plan: Principal Problem:  *C. difficile colitis improved on flagyl 3 Active Problems:  Hypotension resolved.  Restart amlodipine  Acute renal failure resolved  Hypokalemia resolved  Anemia stable  Disposition  Ambulate today, hope to discharge in am   LOS: 3 days   Dallon Dacosta JOSEPH 08/27/2011, 10:00 AM

## 2011-08-28 LAB — BASIC METABOLIC PANEL
BUN: 5 mg/dL — ABNORMAL LOW (ref 6–23)
CO2: 22 mEq/L (ref 19–32)
Chloride: 106 mEq/L (ref 96–112)
Creatinine, Ser: 1.14 mg/dL (ref 0.50–1.35)
Glucose, Bld: 101 mg/dL — ABNORMAL HIGH (ref 70–99)

## 2011-08-28 MED ORDER — AMITRIPTYLINE HCL 25 MG PO TABS: 25.0000 mg | ORAL_TABLET | Freq: Every day | ORAL | Status: DC

## 2011-08-28 MED ORDER — POTASSIUM CHLORIDE CRYS ER 20 MEQ PO TBCR
40.0000 meq | EXTENDED_RELEASE_TABLET | Freq: Once | ORAL | Status: AC
  Administered 2011-08-28: 40 meq via ORAL
  Filled 2011-08-28: qty 2

## 2011-08-28 MED ORDER — METRONIDAZOLE 500 MG PO TABS: 500.0000 mg | ORAL_TABLET | Freq: Three times a day (TID) | ORAL | Status: AC

## 2011-08-28 NOTE — Discharge Instructions (Signed)
Call if you redevelop diarrhea.

## 2011-08-28 NOTE — Discharge Summary (Signed)
Physician Discharge Summary  Patient ID: Danny [name] MRN: [id number] DOB/AGE: September 05, 1926 78 y.o.  Admit date: 08/24/2011 Discharge date: 08/28/2011  Admission Diagnoses: Diarrhea Hypotension Acute renal failure Hypokalemia Anemia  Discharge Diagnoses:  Principal Problem:  *C. difficile colitis Active Problems:  Hypotension  Acute renal failure  Hypokalemia  Anemia   Discharged Condition: good  Hospital Course: The patient was admitted on 08/24/2011 complaining of nausea vomiting diarrhea for several days prior to admission. His appetite was poor and he cannot keep anything down. His urine output began to decrease in the 24 hours prior to admission. He complains of dizziness on ambulation syncope. In the emergency room was found to have systolic blood 77 she higher creatinine 3.45 and was given aggressive fluid resuscitation and admitted to the hospital.lab laboratories at admission remarkable for 3 BUN 38 creatinine 2.45, WBC 11.7 hemoglobin 11.0. The patient was admitted and given IV fluids. His blood pressure quickly stabilized. His nausea and vomiting resolved. His C. difficile PCR test was positive. The patient had received antibiotics for a neck abscess about one month prior to admission. He was started on metronidazole 500 mg by mouth 3 times a day. By discharge his diarrhea had resolved. His renal function had normalized at discharge BUN was 5 creatinine was 1.14. Hypokalemia was corrected. The patient was ambulating without difficulty. His appetite was good he was tolerating his diet. His blood pressure medicines were restarted at discharge.  CODE STATUS full code Diet low-sodium diet Activity as tolerated   Consults: None  Significant Diagnostic Studies: labs: As above and radiology: CXR: normal  Treatments: IV hydration and antibiotics: metronidazole  Discharge Exam: Blood pressure 151/64, pulse 80, temperature 98.3 F (36.8 C), temperature source Oral, resp. rate  18, height 5\' 7"  (1.702 m), weight 74.6 kg (164 lb 7.4 oz), SpO2 97.00%. Resp: clear to auscultation bilaterally Cardio: regular rate and rhythm, S1, S2 normal, no murmur, click, rub or gallop GI: soft, non-tender; bowel sounds normal; no masses,  no organomegaly  Disposition:  discharged home    Medication List  As of 08/28/2011  7:24 AM   TAKE these medications         amitriptyline 25 MG tablet   Commonly known as: ELAVIL   Take 1 tablet (25 mg total) by mouth at bedtime.      amitriptyline 25 MG tablet   Commonly known as: ELAVIL   Take 25 mg by mouth at bedtime.      amLODipine 10 MG tablet   Commonly known as: NORVASC   Take 10 mg by mouth daily.      aspirin EC 81 MG tablet   Take 81 mg by mouth daily.      atorvastatin 40 MG tablet   Commonly known as: LIPITOR   Take 40 mg by mouth daily.      lisinopril 20 MG tablet   Commonly known as: PRINIVIL,ZESTRIL   Take 20 mg by mouth daily.      metoprolol 50 MG tablet   Commonly known as: LOPRESSOR   Take 50 mg by mouth 2 (two) times daily.      metroNIDAZOLE 500 MG tablet   Commonly known as: FLAGYL   Take 1 tablet (500 mg total) by mouth every 8 (eight) hours.      nitroGLYCERIN 0.4 MG SL tablet   Commonly known as: NITROSTAT   Place 0.4 mg under the tongue every 5 (five) minutes as needed. For cheat pain.  Follow-up Information    Follow up with Irven Shelling, MD in 10 days.   Contact information:   [place], [address] [employer], [place]. [place] [phone]          Signed: Irven Shelling 08/28/2011, 7:24 AM

## 2011-08-31 LAB — CULTURE, BLOOD (ROUTINE X 2)
Culture  Setup Time: 201306140244
Culture: NO GROWTH

## 2013-02-11 ENCOUNTER — Encounter: Payer: Self-pay | Admitting: Interventional Cardiology

## 2013-02-12 ENCOUNTER — Encounter: Payer: Self-pay | Admitting: Interventional Cardiology

## 2013-02-12 ENCOUNTER — Ambulatory Visit (INDEPENDENT_AMBULATORY_CARE_PROVIDER_SITE_OTHER): Payer: Medicare Other | Admitting: Interventional Cardiology

## 2013-02-12 VITALS — BP 120/60 | HR 62 | Ht 67.0 in | Wt 160.0 lb

## 2013-02-12 DIAGNOSIS — N184 Chronic kidney disease, stage 4 (severe): Secondary | ICD-10-CM

## 2013-02-12 DIAGNOSIS — E785 Hyperlipidemia, unspecified: Secondary | ICD-10-CM

## 2013-02-12 DIAGNOSIS — I10 Essential (primary) hypertension: Secondary | ICD-10-CM | POA: Insufficient documentation

## 2013-02-12 DIAGNOSIS — I2510 Atherosclerotic heart disease of native coronary artery without angina pectoris: Secondary | ICD-10-CM

## 2013-02-12 DIAGNOSIS — I25709 Atherosclerosis of coronary artery bypass graft(s), unspecified, with unspecified angina pectoris: Secondary | ICD-10-CM | POA: Insufficient documentation

## 2013-02-12 NOTE — Patient Instructions (Signed)
Your physician recommends that you continue on your current medications as directed. Please refer to the Current Medication list given to you today.  Your physician wants you to follow-up in: 1 year. You will receive a reminder letter in the mail two months in advance. If you don't receive a letter, please call our office to schedule the follow-up appointment.  

## 2013-02-12 NOTE — Progress Notes (Signed)
Patient ID: Danny [name], male   DOB: 1926/06/05, 79 y.o.   MRN: [id number] Past Medical History  CAD,CABG with LIMA to LAD, SVG to ramus, SVG to obtuse marginal, and SVG to PDA. 2002, Hollie Wojahn   Hypertension   Hyperlipidemia   chronic kidney disease stage III   Cerebrovascular disease status post endarterectomy   microscopic hematuria with negative workup about 2000, BPH,Wrenn   Erectile dysfunction   syncope October 2000   Insomnia      [address], [place]  [address] Phone: [phone] Fax:  [phone]  Date:  02/12/2013   ID:  Danny [name], DOB [birth year], MRN [id number]  PCP:  Irven Shelling, MD    ASSESSMENT:  1. Excessive daytime sleepiness 2. coronary artery disease, stable 3. Hypertension  PLAN:  1. No change in therapy 2. Consider sleep study, but I will leave this to the discretion of Dr. Laurann Montana   SUBJECTIVE: Danny [name] is a 79 y.o. male who has no cardiac complaints. He notices excessive daytime sleepiness. He lives alone. No angina or nitroglycerin use. He denies dyspnea and orthopnea   Wt Readings from Last 3 Encounters:  02/12/13 160 lb (72.576 kg)  08/26/11 164 lb 7.4 oz (74.6 kg)     Past Medical History  Diagnosis Date  . Hypertension   . Hyperlipidemia   . Pneumonia     Current Outpatient Prescriptions  Medication Sig Dispense Refill  . amitriptyline (ELAVIL) 25 MG tablet Take 25 mg by mouth at bedtime.      Marland Kitchen amLODipine (NORVASC) 10 MG tablet Take 10 mg by mouth daily.      Marland Kitchen aspirin EC 81 MG tablet Take 81 mg by mouth daily.      Marland Kitchen atorvastatin (LIPITOR) 40 MG tablet Take 40 mg by mouth daily.      Marland Kitchen lisinopril (PRINIVIL,ZESTRIL) 20 MG tablet Take 20 mg by mouth daily.      . metoprolol (LOPRESSOR) 50 MG tablet Take 50 mg by mouth 2 (two) times daily.      . nitroGLYCERIN (NITROSTAT) 0.4 MG SL tablet Place 0.4 mg under the tongue every 5 (five) minutes as needed. For cheat pain.       No  current facility-administered medications for this visit.    Allergies:   No Known Allergies  Social History:  The patient  reports that he has quit smoking. He does not have any smokeless tobacco history on file. He reports that he does not drink alcohol or use illicit drugs.   ROS:  Please see the history of present illness.   Denies neurological complaints, syncope, palpitations, and claudication.   All other systems reviewed and negative.   OBJECTIVE: VS:  BP 120/60  Pulse 62  Ht 5\' 7"  (1.702 m)  Wt 160 lb (72.576 kg)  BMI 25.05 kg/m2 Well nourished, well developed, in no acute distress, appearing his stated age 38: normal Neck: JVD flat. Carotid bruit absent  Cardiac:  normal S1, S2; RRR; no murmur Lungs:  clear to auscultation bilaterally, no wheezing, rhonchi or rales Abd: soft, nontender, no hepatomegaly Ext: Edema absent. Pulses trace to 1+ bilateral Skin: warm and dry Neuro:  CNs 2-12 intact, no focal abnormalities noted  EKG:  Normal sinus rhythm with nonspecific T wave flattening       Signed, Illene Labrador III, MD 02/12/2013 10:09 AM

## 2014-01-23 ENCOUNTER — Encounter: Payer: Self-pay | Admitting: Interventional Cardiology

## 2014-02-16 ENCOUNTER — Ambulatory Visit: Payer: Medicare Other | Admitting: Interventional Cardiology

## 2014-04-06 ENCOUNTER — Ambulatory Visit: Payer: Medicare Other | Admitting: Interventional Cardiology

## 2014-04-07 ENCOUNTER — Ambulatory Visit: Payer: Self-pay | Admitting: Interventional Cardiology

## 2014-05-23 ENCOUNTER — Emergency Department (HOSPITAL_COMMUNITY)
Admission: EM | Admit: 2014-05-23 | Discharge: 2014-05-23 | Disposition: A | Discharge status: Home / Self Care | Payer: Medicare Other | Attending: Emergency Medicine | Admitting: Emergency Medicine

## 2014-05-23 ENCOUNTER — Encounter (HOSPITAL_COMMUNITY): Payer: Self-pay | Admitting: Emergency Medicine

## 2014-05-23 DIAGNOSIS — Z79899 Other long term (current) drug therapy: Secondary | ICD-10-CM | POA: Diagnosis not present

## 2014-05-23 DIAGNOSIS — E785 Hyperlipidemia, unspecified: Secondary | ICD-10-CM | POA: Insufficient documentation

## 2014-05-23 DIAGNOSIS — Z951 Presence of aortocoronary bypass graft: Secondary | ICD-10-CM | POA: Insufficient documentation

## 2014-05-23 DIAGNOSIS — L03116 Cellulitis of left lower limb: Secondary | ICD-10-CM | POA: Diagnosis not present

## 2014-05-23 DIAGNOSIS — Z7982 Long term (current) use of aspirin: Secondary | ICD-10-CM | POA: Insufficient documentation

## 2014-05-23 DIAGNOSIS — I10 Essential (primary) hypertension: Secondary | ICD-10-CM | POA: Insufficient documentation

## 2014-05-23 DIAGNOSIS — Z8701 Personal history of pneumonia (recurrent): Secondary | ICD-10-CM | POA: Insufficient documentation

## 2014-05-23 DIAGNOSIS — M7989 Other specified soft tissue disorders: Secondary | ICD-10-CM | POA: Diagnosis present

## 2014-05-23 LAB — CBC
HEMATOCRIT: 31.4 % — AB (ref 39.0–52.0)
Hemoglobin: 10.7 g/dL — ABNORMAL LOW (ref 13.0–17.0)
MCH: 33.3 pg (ref 26.0–34.0)
MCHC: 34.1 g/dL (ref 30.0–36.0)
MCV: 97.8 fL (ref 78.0–100.0)
Platelets: 171 10*3/uL (ref 150–400)
RBC: 3.21 MIL/uL — ABNORMAL LOW (ref 4.22–5.81)
RDW: 13.5 % (ref 11.5–15.5)
WBC: 6.5 10*3/uL (ref 4.0–10.5)

## 2014-05-23 LAB — BASIC METABOLIC PANEL
Anion gap: 10 (ref 5–15)
BUN: 25 mg/dL — ABNORMAL HIGH (ref 6–23)
CALCIUM: 8.8 mg/dL (ref 8.4–10.5)
CHLORIDE: 104 mmol/L (ref 96–112)
CO2: 24 mmol/L (ref 19–32)
Creatinine, Ser: 1.63 mg/dL — ABNORMAL HIGH (ref 0.50–1.35)
GFR calc Af Amer: 43 mL/min — ABNORMAL LOW (ref >90)
GFR calc non Af Amer: 37 mL/min — ABNORMAL LOW (ref >90)
GLUCOSE: 96 mg/dL (ref 70–99)
Potassium: 4.6 mmol/L (ref 3.5–5.1)
SODIUM: 138 mmol/L (ref 135–145)

## 2014-05-23 IMAGING — CR DG CHEST 1V PORT
1 series · 1 of 1 positions shown · non-contrast
Comparison: None.

CLINICAL DATA: Vomiting and diarrhea.

PORTABLE CHEST - 1 VIEW

[AP]
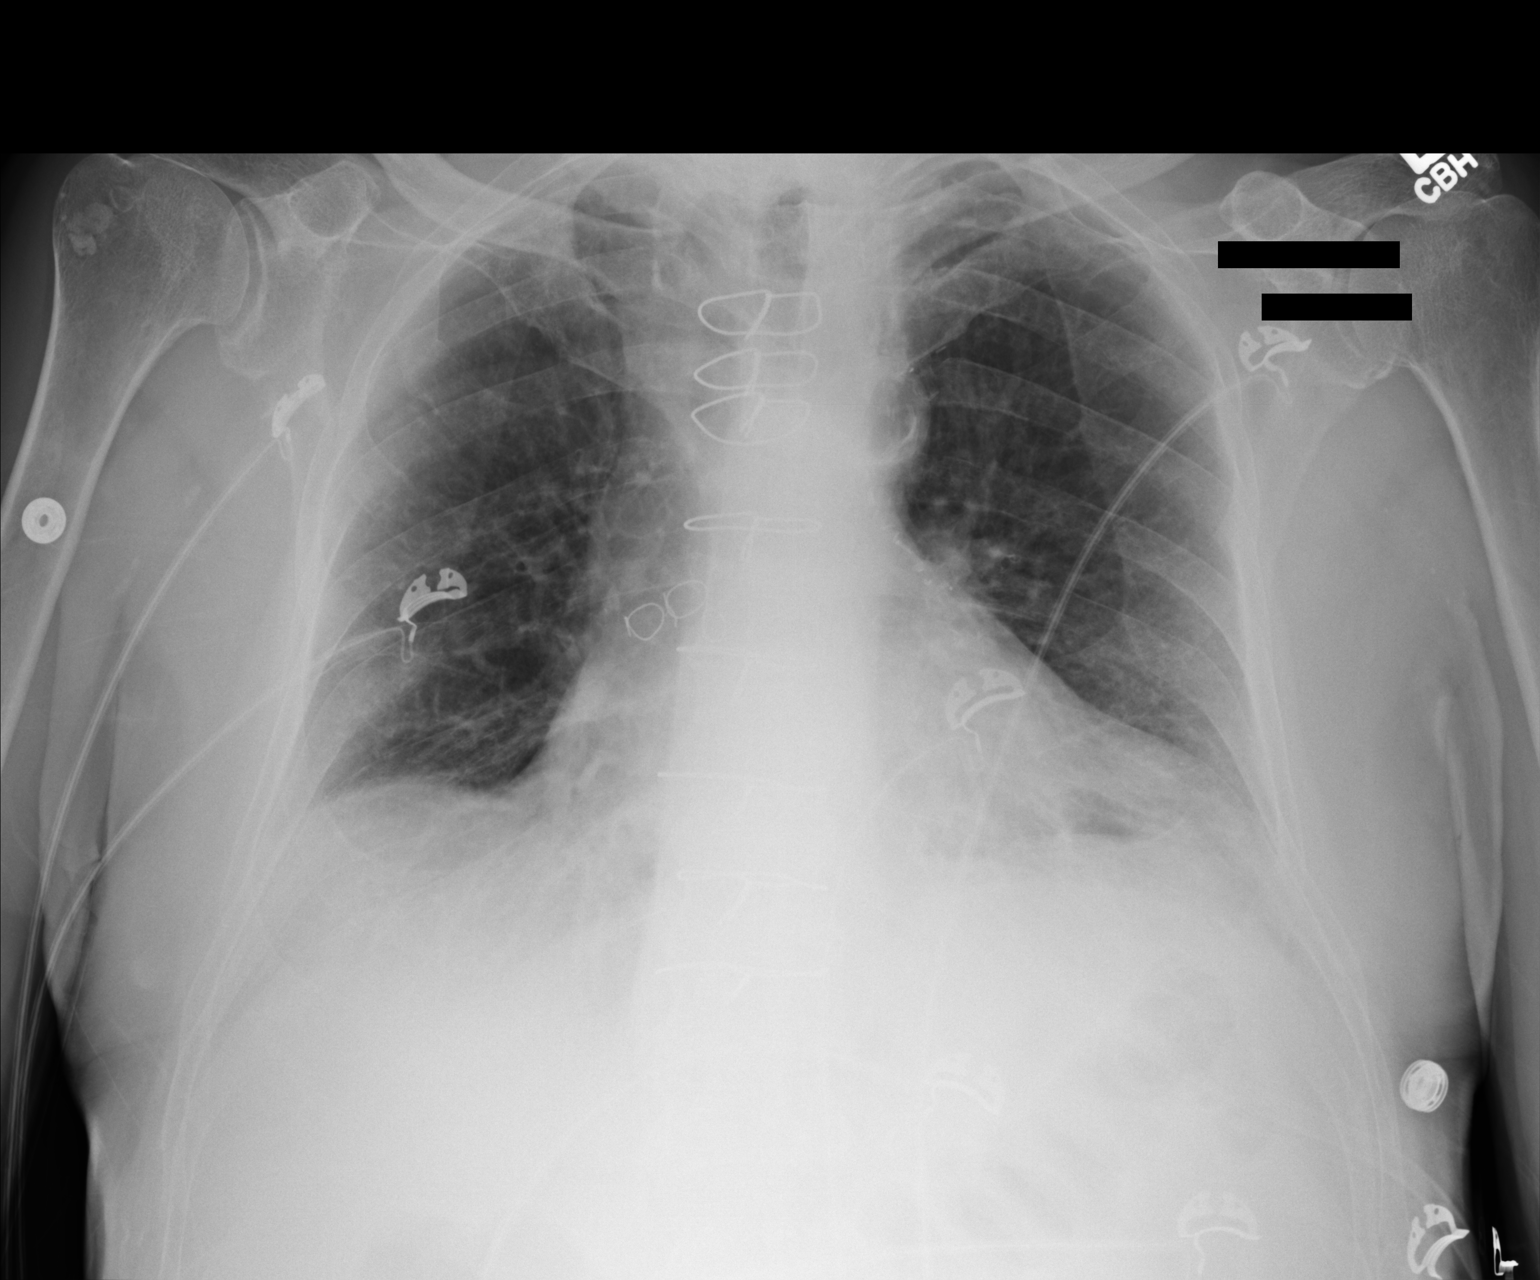

[1 of 1 positions shown; findings below may reference images not displayed]

FINDINGS: Patchy left basilar airspace disease is identified.
Right lung is clear.  Heart size upper normal.  The patient is
status post CABG.  Degenerative disease about the right shoulder
noted.
IMPRESSION: Patchy left basilar airspace disease is likely due to atelectasis
rather than pneumonia.

## 2014-05-23 MED ORDER — CEPHALEXIN 500 MG PO CAPS: 500.0000 mg | ORAL_CAPSULE | Freq: Four times a day (QID) | ORAL | Status: DC

## 2014-05-23 NOTE — Discharge Instructions (Signed)
Return to the ED with any concerns including increased area of redness, fever/chills, vomiting and not able to keep down antibiotics or liquids, decreased level of alertness/lethargy, or any other alarming symptoms

## 2014-05-23 NOTE — ED Notes (Signed)
Pt reports left foot redness and swelling since today, along with chronic posterior leg pain.  Pt has hx open heart surgery and carotid artery surgery.  Pt pulses present, alert and oriented.

## 2014-05-23 NOTE — ED Notes (Signed)
Left foot warmth, swelling, and erythema. Onset 2 days ago. Hx of MI. NO SOB or CP present. NO pain or swelling to back of knee. Pain when standing on foot

## 2014-05-23 NOTE — ED Notes (Addendum)
On arrival to room Pt removed socks swelling to bilateral lower extremity . Pt and family reported pt has had recent calf pain to Lt leg . Pt also reports redness to Lt foot . Pulses doppler ,flow present.

## 2014-05-23 NOTE — ED Notes (Signed)
Spoke with Lab BMP still has about 15 mins left before its resulted.

## 2014-05-23 NOTE — ED Provider Notes (Signed)
CSN: [id number]     Arrival date & time 05/23/14  1711 History   First MD Initiated Contact with Patient 05/23/14 1833     Chief Complaint  Patient presents with  . Leg Swelling  . Leg Pain     (Consider location/radiation/quality/duration/timing/severity/associated sxs/prior Treatment) HPI  Pt presenting with c/o left foot redness and swelling.  He states this started earlier today.  He has been having some bilateral hip pain which has been worse on the left with some pain in his left calf as well.  This has been ongoing, but the redness in his foot just started today.  No fever/chills.  No malaise or fatigue. No other systemic symptoms.  No pain in foot.  He has not tried anything for his symptoms or had any treatment prior to arrival.  There are no other associated systemic symptoms, there are no other alleviating or modifying factors.   Past Medical History  Diagnosis Date  . Hypertension   . Hyperlipidemia   . Pneumonia    Past Surgical History  Procedure Laterality Date  . Coronary artery bypass graft    . Hernia repair    . Salivary gland surgery    . Carotid endarterectomy  bilateral   Family History  Problem Relation Age of Onset  . Heart failure Mother   . Heart failure Father    History  Substance Use Topics  . Smoking status: Former Research scientist (life sciences)  . Smokeless tobacco: Not on file  . Alcohol Use: No    Review of Systems  ROS reviewed and all otherwise negative except for mentioned in HPI    Allergies  Clindamycin/lincomycin  Home Medications   Prior to Admission medications   Medication Sig Start Date End Date Taking? Authorizing Provider  amitriptyline (ELAVIL) 25 MG tablet Take 25 mg by mouth at bedtime.   Yes Historical Provider, MD  amLODipine (NORVASC) 10 MG tablet Take 10 mg by mouth daily.   Yes Historical Provider, MD  aspirin EC 81 MG tablet Take 81 mg by mouth daily.   Yes Historical Provider, MD  atorvastatin (LIPITOR) 40 MG tablet Take 40 mg by  mouth daily.   Yes Historical Provider, MD  lisinopril (PRINIVIL,ZESTRIL) 20 MG tablet Take 20 mg by mouth daily.   Yes Historical Provider, MD  metoprolol (LOPRESSOR) 50 MG tablet Take 50 mg by mouth 2 (two) times daily.   Yes Historical Provider, MD  cephALEXin (KEFLEX) 500 MG capsule Take 1 capsule (500 mg total) by mouth 4 (four) times daily. 05/23/14   Alfonzo Beers, MD  nitroGLYCERIN (NITROSTAT) 0.4 MG SL tablet Place 0.4 mg under the tongue every 5 (five) minutes as needed. For cheat pain.    Historical Provider, MD   BP 184/45 mmHg  Pulse 75  Temp(Src) 98.5 F (36.9 C) (Oral)  Resp 16  Ht 5\' 7"  (1.702 m)  Wt 173 lb 3 oz (78.557 kg)  BMI 27.12 kg/m2  SpO2 98%  Vitals reviewed Physical Exam  Physical Examination: General appearance - alert, well appearing, and in no distress Mental status - alert, oriented to person, place, and time Eyes -no conjunctival injection, no scleral icterus Mouth - mucous membranes moist, pharynx normal without lesions Chest - clear to auscultation, no wheezes, rales or rhonchi, symmetric air entry Heart - normal rate, regular rhythm, normal S1, S2, no murmurs, rubs, clicks or gallops Abdomen - soft, nontender, nondistended, no masses or organomegaly Neurological - alert, oriented, normal speech, no focal findings or movement disorder  noted Extremities - 2+ DP pulse on right, left dp pulse present by doppler, no significant pedal edema, plantar surface of left foot and toes with erythema, nontender to palpation, no ttp over calf, no palpable cords, negative homan sign Skin - normal coloration and turgor except erythema of plantar surface of left foot and toes  ED Course  Procedures (including critical care time) Labs Review Labs Reviewed  CBC - Abnormal; Notable for the following:    RBC 3.21 (*)    Hemoglobin 10.7 (*)    HCT 31.4 (*)    All other components within normal limits  BASIC METABOLIC PANEL - Abnormal; Notable for the following:    BUN  25 (*)    Creatinine, Ser 1.63 (*)    GFR calc non Af Amer 37 (*)    GFR calc Af Amer 43 (*)    All other components within normal limits    Imaging Review No results found.   EKG Interpretation None      MDM   Final diagnoses:  Cellulitis of left foot    Pt presenting with left redness of foot.  Labs are reassuring, there is some increase in his creatinine over recent baseline- this was d/w patient.  Pt started on abx for possible cellultiis.  He will have DVT study tomorrow morning although I feel this is less likely.  Symptoms may also be due to vascular insufficiency- he was referred to vascular surgery for followup as well.  He has a pulse that is present by doppler on left DP.  Discharged with strict return precautions.  Pt agreeable with plan.    Alfonzo Beers, MD 05/24/14 [phone]

## 2014-05-24 ENCOUNTER — Ambulatory Visit (HOSPITAL_COMMUNITY)
Admission: RE | Admit: 2014-05-24 | Discharge: 2014-05-24 | Disposition: A | Discharge status: Home / Self Care | Payer: Medicare Other | Source: Ambulatory Visit | Attending: Emergency Medicine | Admitting: Emergency Medicine

## 2014-05-24 DIAGNOSIS — M79609 Pain in unspecified limb: Secondary | ICD-10-CM | POA: Diagnosis not present

## 2014-05-24 DIAGNOSIS — M79605 Pain in left leg: Secondary | ICD-10-CM | POA: Diagnosis not present

## 2014-05-24 NOTE — Progress Notes (Addendum)
VASCULAR LAB PRELIMINARY  PRELIMINARY  PRELIMINARY  PRELIMINARY  Left lower extremity venous Doppler completed.    Preliminary report:  There is no DVT or SVT noted in the left lower extremity.   Patient described claudication symptoms in hips and calves when walking.  Incidentally, Doppler waveforms of the left lower extremity arteries and right common femoral and femoral arteries suggest possible aorto-iliac disease.  Danny [name], RVT 05/24/2014, 12:22 PM

## 2014-06-08 ENCOUNTER — Ambulatory Visit (INDEPENDENT_AMBULATORY_CARE_PROVIDER_SITE_OTHER): Payer: Medicare Other | Admitting: Interventional Cardiology

## 2014-06-08 ENCOUNTER — Encounter: Payer: Self-pay | Admitting: Interventional Cardiology

## 2014-06-08 VITALS — BP 158/54 | HR 59 | Ht 67.0 in | Wt 162.4 lb

## 2014-06-08 DIAGNOSIS — R200 Anesthesia of skin: Secondary | ICD-10-CM

## 2014-06-08 DIAGNOSIS — R208 Other disturbances of skin sensation: Secondary | ICD-10-CM

## 2014-06-08 DIAGNOSIS — R0989 Other specified symptoms and signs involving the circulatory and respiratory systems: Secondary | ICD-10-CM

## 2014-06-08 DIAGNOSIS — I25118 Atherosclerotic heart disease of native coronary artery with other forms of angina pectoris: Secondary | ICD-10-CM | POA: Diagnosis not present

## 2014-06-08 DIAGNOSIS — M79604 Pain in right leg: Secondary | ICD-10-CM | POA: Diagnosis not present

## 2014-06-08 DIAGNOSIS — I10 Essential (primary) hypertension: Secondary | ICD-10-CM | POA: Diagnosis not present

## 2014-06-08 DIAGNOSIS — E785 Hyperlipidemia, unspecified: Secondary | ICD-10-CM | POA: Diagnosis not present

## 2014-06-08 DIAGNOSIS — M79605 Pain in left leg: Secondary | ICD-10-CM

## 2014-06-08 NOTE — Patient Instructions (Addendum)
Your physician recommends that you continue on your current medications as directed. Please refer to the Current Medication list given to you today.  Your physician has requested that you have a lower extremity arterial duplex. This test is an ultrasound of the arteries in the legs. It looks at arterial blood flow in the legs. Allow one hour for Lower  Arterial scans. There are no restrictions or special instructions  Your physician has requested that you have a carotid duplex. This test is an ultrasound of the carotid arteries in your neck. It looks at blood flow through these arteries that supply the brain with blood. Allow one hour for this exam. There are no restrictions or special instructions.  Your physician recommends that you schedule a follow-up appointment in: 1 month with Dr. Tamala Julian.

## 2014-06-08 NOTE — Progress Notes (Signed)
Cardiology Office Note   Date:  06/08/2014   ID:  [name], DOB 1926/05/16, MRN [id number]  PCP:  Irven Shelling, MD  Cardiologist:   Sinclair Grooms, MD   No chief complaint on file.     History of Present Illness: Danny [name] is a 81 y.o. male who presents for CAD. He has no angina or cardiopulmonary complaints but the major concern is inability to walk more than 25 yards without severe bilateral hip and lower extremity pain and numbness. Rest relieves the discomfort. He comes back again with the same amount of physical activity. This is been progressive over the last 12-24 months.    Past Medical History  Diagnosis Date  . Hypertension   . Hyperlipidemia   . Pneumonia   . ED (erectile dysfunction)   . Coronary artery disease   . Chronic kidney disease   . PVD (peripheral vascular disease)   . Cerebrovascular disease   . Syncope   . Insomnia     Past Surgical History  Procedure Laterality Date  . Coronary artery bypass graft    . Hernia repair    . Salivary gland surgery    . Carotid endarterectomy  bilateral  . Coronary artery bypass graft    . Endarterectomy       Current Outpatient Prescriptions  Medication Sig Dispense Refill  . amitriptyline (ELAVIL) 25 MG tablet Take 25 mg by mouth at bedtime.    Marland Kitchen amLODipine (NORVASC) 10 MG tablet Take 10 mg by mouth daily.    Marland Kitchen aspirin EC 81 MG tablet Take 81 mg by mouth daily.    Marland Kitchen atorvastatin (LIPITOR) 40 MG tablet Take 40 mg by mouth daily.    . cholecalciferol (VITAMIN D) 1000 UNITS tablet Take 1,000 Units by mouth daily.    Marland Kitchen lisinopril (PRINIVIL,ZESTRIL) 20 MG tablet Take 20 mg by mouth daily.    . metoprolol (LOPRESSOR) 50 MG tablet Take 50 mg by mouth 2 (two) times daily.    . nitroGLYCERIN (NITROSTAT) 0.4 MG SL tablet Place 0.4 mg under the tongue every 5 (five) minutes as needed. For cheat pain.     No current facility-administered medications for this visit.    Allergies:    Clindamycin/lincomycin    Social History:  The patient  reports that he has quit smoking. He does not have any smokeless tobacco history on file. He reports that he does not drink alcohol or use illicit drugs.   Family History:  The patient's family history includes Heart failure in his father and mother.    ROS:  Please see the history of present illness.   Otherwise, review of systems are positive for discoloration and lower extremity. Family also suggested there is diminished cognitive function.   All other systems are reviewed and negative.    PHYSICAL EXAM: VS:  BP 158/54 mmHg  Pulse 59  Ht 5\' 7"  (1.702 m)  Wt 162 lb 6.4 oz (73.664 kg)  BMI 25.43 kg/m2 , BMI Body mass index is 25.43 kg/(m^2). GEN: Well nourished, well developed, in no acute distress HEENT: normal Neck: no JVD, carotid bruits, or masses. Right carotid bruit is heard. Cardiac: RRR; rubs, or gallops,no edema . Respiratory:  clear to auscultation bilaterally, normal work of breathing GI: soft, nontender, nondistended, + BS MS: no deformity or atrophy Skin: warm and dry, no rash Neuro:  Strength and sensation are intact Psych: euthymic mood, full affect   EKG:  EKG is ordered today.  The ekg ordered today demonstrates sinus bradycardia and nonspecific T-wave abnormality. Prominent voltage is noted compatible with LVH   Recent Labs: 05/23/2014: BUN 25*; Creatinine 1.63*; Hemoglobin 10.7*; Platelets 171; Potassium 4.6; Sodium 138    Lipid Panel No results found for: CHOL, TRIG, HDL, CHOLHDL, VLDL, LDLCALC, LDLDIRECT    Wt Readings from Last 3 Encounters:  06/08/14 162 lb 6.4 oz (73.664 kg)  05/23/14 173 lb 3 oz (78.557 kg)  02/12/13 160 lb (72.576 kg)      Other studies Reviewed: Additional studies/ records that were reviewed today include: Reviewed records from [place]. There is a history of peripheral arterial disease with claudication. As a history of cerebrovascular disease status post  endarterectomy.. Review of the above records demonstrates: CABG performed in 2002 with LIMA to LAD, SVG to ramus, SVG to OM, and SVG to PDA.   ASSESSMENT AND PLAN:  Atherosclerosis of native coronary artery of native heart with other form of angina pectoris: No angina  Claudication, precipitated by minimal activity left leg greater than right.  Carotid bruit: Right carotid bruit is heard  CKD, stage IV  Essential hypertension, benign: Adequate control     Current medicines are reviewed at length with the patient today.  The patient does not have concerns regarding medicines.  The following changes have been made:  no change.  Labs/ tests ordered today include: Evaluate lower extremity circulation. We will likely need a monitored [place] BUN increased risk because of kidney impairment   Orders Placed This Encounter  Procedures  . EKG 12-Lead  . Lower Extremity Arterial Doppler Bilateral  . Carotid duplex     Disposition:   FU with Linard Millers in 4 weeks. Will likely need referral to Dr. Fletcher Anon or Gwenlyn Found.   Signed, Sinclair Grooms, MD  06/08/2014 5:45 PM    [address], [place], [place]  [address] Phone: [phone]; Fax: [phone]

## 2014-06-16 ENCOUNTER — Ambulatory Visit (HOSPITAL_BASED_OUTPATIENT_CLINIC_OR_DEPARTMENT_OTHER): Payer: Medicare Other | Admitting: Cardiology

## 2014-06-16 ENCOUNTER — Ambulatory Visit (HOSPITAL_COMMUNITY): Payer: Medicare Other | Attending: Cardiovascular Disease | Admitting: Cardiology

## 2014-06-16 ENCOUNTER — Other Ambulatory Visit (HOSPITAL_COMMUNITY): Payer: Self-pay | Admitting: Cardiology

## 2014-06-16 DIAGNOSIS — M79605 Pain in left leg: Secondary | ICD-10-CM

## 2014-06-16 DIAGNOSIS — I6523 Occlusion and stenosis of bilateral carotid arteries: Secondary | ICD-10-CM | POA: Insufficient documentation

## 2014-06-16 DIAGNOSIS — R200 Anesthesia of skin: Secondary | ICD-10-CM

## 2014-06-16 DIAGNOSIS — R0989 Other specified symptoms and signs involving the circulatory and respiratory systems: Secondary | ICD-10-CM

## 2014-06-16 DIAGNOSIS — M79604 Pain in right leg: Secondary | ICD-10-CM | POA: Diagnosis not present

## 2014-06-16 DIAGNOSIS — R208 Other disturbances of skin sensation: Secondary | ICD-10-CM | POA: Insufficient documentation

## 2014-06-16 DIAGNOSIS — I739 Peripheral vascular disease, unspecified: Secondary | ICD-10-CM

## 2014-06-16 NOTE — Progress Notes (Signed)
Carotid duplex performed 

## 2014-06-16 NOTE — Progress Notes (Signed)
Lower arterial Doppler and bilateral Duplex performed  

## 2014-06-19 ENCOUNTER — Telehealth: Payer: Self-pay

## 2014-06-19 NOTE — Telephone Encounter (Signed)
Follow up:   Pt's daughter called for pt's test results please give her a call back.

## 2014-06-19 NOTE — Telephone Encounter (Signed)
Pt daughter aware of carotid and [place] results Carotid-Bilateral diffuse narrowing with <60% blockage [place]-Severe bilateral occlusive disease. Please arrnge to see DR. Gwenlyn Found or Arida ASAP  Pt daughter would prefer for pt to be seen by Dr.Arida at this location. Adv her a scheduler from our office will call her to schedule pt consult with Dr.Arida Pt daughter verbalized understanding.

## 2014-06-19 NOTE — Telephone Encounter (Signed)
-----   Message from Belva Crome, MD sent at 06/18/2014  9:49 AM EDT ----- Severe bilateral occlusive disease. Please arrnge to see DR. Gwenlyn Found or [employer] ASAP

## 2014-06-19 NOTE — Telephone Encounter (Signed)
called to give pt carotis and le dopp results. unable to lmom. pt phone ringas out, will attempt again later

## 2014-06-22 NOTE — Telephone Encounter (Signed)
Follow Up   Pt has an appt on 05/02 with Dr. Tamala Julian. Req a call back to determine if the appt is still needed//sr

## 2014-06-22 NOTE — Telephone Encounter (Signed)
Returned pt daughter call. Adv her that we will cancel pt appt with Dr.Smith on 5/2. Pt should keep appt with Dr.Arida on 5/3.pt can f/u with Dr. Tamala Julian for his CAD in 9-12 mo. Recall in epic. Pt daughter verbalized understanding.

## 2014-07-13 ENCOUNTER — Ambulatory Visit: Payer: Medicare Other | Admitting: Interventional Cardiology

## 2014-07-14 ENCOUNTER — Encounter: Payer: Self-pay | Admitting: Cardiovascular Disease

## 2014-07-14 ENCOUNTER — Ambulatory Visit (INDEPENDENT_AMBULATORY_CARE_PROVIDER_SITE_OTHER): Payer: Medicare Other | Admitting: Cardiovascular Disease

## 2014-07-14 VITALS — BP 124/54 | HR 64 | Ht 66.0 in | Wt 159.8 lb

## 2014-07-14 DIAGNOSIS — I739 Peripheral vascular disease, unspecified: Secondary | ICD-10-CM | POA: Diagnosis not present

## 2014-07-14 MED ORDER — CILOSTAZOL 50 MG PO TABS: 50.0000 mg | ORAL_TABLET | Freq: Two times a day (BID) | ORAL | Status: DC

## 2014-07-14 NOTE — Progress Notes (Signed)
Cardiology Office Note   Date:  07/14/2014   ID:  [name], DOB 1926/11/14, MRN [id number]  PCP:  Irven Shelling, MD  Cardiologist: Dr. Tamala Julian  Chief Complaint  Patient presents with  . Follow-up    consult      History of Present Illness: Danny [name] is a 81 y.o. male who was referred by Dr. Tamala Julian for evaluation and management of severe claudication due to peripheral arterial disease. He has known history of coronary artery disease status post CABG more than 10 years ago. He had bilateral carotid endarterectomy, chronic kidney disease, hypertension and hyperlipidemia. He quit smoking more than 20 years ago. He has been stable from a cardiac standpoint. However, he has been having progressive symptoms of bilateral hip and thigh discomfort with walking. This is now happening with very short distance of 25 yards. He reports discomfort at night at rest which wakes him up from sleep. He is independent and lives by himself. He is usually very active around the house and yard work. However, he has not been able to do much lately because of his claudication.  He had recent noninvasive evaluation which showed an ABI of 0.41 on the right and 0.34 on the left. There was mild nonobstructive SFA disease and three-vessel runoff bilaterally. There were monophasic waveforms throughout bilateral lower extremities suggestive of significant inflow disease.     Past Medical History  Diagnosis Date  . Hypertension   . Hyperlipidemia   . Pneumonia   . ED (erectile dysfunction)   . Coronary artery disease   . Chronic kidney disease   . PVD (peripheral vascular disease)   . Cerebrovascular disease   . Syncope   . Insomnia     Past Surgical History  Procedure Laterality Date  . Coronary artery bypass graft    . Hernia repair    . Salivary gland surgery    . Carotid endarterectomy  bilateral  . Coronary artery bypass graft    . Endarterectomy       Current Outpatient  Prescriptions  Medication Sig Dispense Refill  . amitriptyline (ELAVIL) 10 MG tablet Take 1 tablet by mouth daily.    Marland Kitchen amLODipine (NORVASC) 10 MG tablet Take 10 mg by mouth daily.    Marland Kitchen aspirin EC 81 MG tablet Take 81 mg by mouth daily.    Marland Kitchen atorvastatin (LIPITOR) 40 MG tablet Take 40 mg by mouth daily.    . cholecalciferol (VITAMIN D) 1000 UNITS tablet Take 1,000 Units by mouth daily.    Marland Kitchen lisinopril (PRINIVIL,ZESTRIL) 20 MG tablet Take 20 mg by mouth daily.    . metoprolol (LOPRESSOR) 50 MG tablet Take 50 mg by mouth 2 (two) times daily.    . nitroGLYCERIN (NITROSTAT) 0.4 MG SL tablet Place 0.4 mg under the tongue every 5 (five) minutes as needed. For cheat pain.    . cilostazol (PLETAL) 50 MG tablet Take 1 tablet (50 mg total) by mouth 2 (two) times daily. 60 tablet 6   No current facility-administered medications for this visit.    Allergies:   Clindamycin/lincomycin    Social History:  The patient  reports that he has quit smoking. He does not have any smokeless tobacco history on file. He reports that he does not drink alcohol or use illicit drugs.   Family History:  The patient's family history includes Heart failure in his father and mother.    ROS:  Please see the history of present illness.  Otherwise, review of systems are positive for discoloration and lower extremity. Family also suggested there is diminished cognitive function.   All other systems are reviewed and negative.    PHYSICAL EXAM: VS:  BP 124/54 mmHg  Pulse 64  Ht 5\' 6"  (1.676 m)  Wt 159 lb 12.8 oz (72.485 kg)  BMI 25.80 kg/m2 , BMI Body mass index is 25.8 kg/(m^2). GEN: Well nourished, well developed, in no acute distress HEENT: normal Neck: no JVD, carotid bruits, or masses. Right carotid bruit is heard. Cardiac: RRR; rubs, or gallops,no edema . Respiratory:  clear to auscultation bilaterally, normal work of breathing GI: soft, nontender, nondistended, + BS MS: no deformity or atrophy Skin: warm and  dry, no rash Neuro:  Strength and sensation are intact Psych: euthymic mood, full affect Vascular: Femoral pulses are absent bilaterally. Distal pulses are not palpable. Radial pulses are normal bilaterally.     Recent Labs: 05/23/2014: BUN 25*; Creatinine 1.63*; Hemoglobin 10.7*; Platelets 171; Potassium 4.6; Sodium 138    Lipid Panel No results found for: CHOL, TRIG, HDL, CHOLHDL, VLDL, LDLCALC, LDLDIRECT    Wt Readings from Last 3 Encounters:  07/14/14 159 lb 12.8 oz (72.485 kg)  06/08/14 162 lb 6.4 oz (73.664 kg)  05/23/14 173 lb 3 oz (78.557 kg)      Other studies Reviewed: Additional studies/ records that were reviewed today include: Reviewed records from [place]. There is a history of peripheral arterial disease with claudication. As a history of cerebrovascular disease status post endarterectomy.. Review of the above records demonstrates: CABG performed in 2002 with LIMA to LAD, SVG to ramus, SVG to OM, and SVG to PDA.   ASSESSMENT AND PLAN:  1. Peripheral arterial disease: The patient has severe lifestyle limiting claudication bilaterally due to inflow disease. 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  I discussed with him the natural history and management of peripheral arterial disease. Given his age, he is likely a poor candidate for aortobifemoral bypass. Endovascular intervention is somewhat limited due to chronic kidney disease and risk of contrast-induced nephropathy. At the same time, the patient is severely limited by his symptoms. I requested an aortoiliac duplex. I elected to start him on small dose cilostazol 50 mg twice daily. Reevaluate symptoms after duplex. Most likely, he will require CO2 angiography and attempted endovascular intervention.   2. Atherosclerosis of native coronary artery of native heart with other form of angina pectoris:  He is stable with no significant angina. Continue  medical therapy.  3. Bilateral carotid stenosis: Carotid Doppler showed 40-59% bilateral ICA stenosis. No indication for revascularization. Continue treatment of risk factors.   Signed, Danny Sacramento, MD  07/14/2014 12:41 PM

## 2014-07-14 NOTE — Patient Instructions (Signed)
Medication Instructions:  Your physician has recommended you make the following change in your medication:  1. START Pletal (Cilostazol) 50mg  take one by mouth twice a day  Labwork: No new orders.  Testing/Procedures: Your physician has recommended an aorto-iliac duplex.  Follow-Up: Your physician recommends that you schedule a follow-up appointment in: 2 WEEKS with Dr Fletcher Anon   Any Other Special Instructions Will Be Listed Below (If Applicable).

## 2014-07-15 ENCOUNTER — Ambulatory Visit (HOSPITAL_COMMUNITY): Payer: Medicare Other | Attending: Cardiovascular Disease

## 2014-07-15 ENCOUNTER — Telehealth: Payer: Self-pay

## 2014-07-15 DIAGNOSIS — E785 Hyperlipidemia, unspecified: Secondary | ICD-10-CM | POA: Insufficient documentation

## 2014-07-15 DIAGNOSIS — I739 Peripheral vascular disease, unspecified: Secondary | ICD-10-CM

## 2014-07-15 DIAGNOSIS — I10 Essential (primary) hypertension: Secondary | ICD-10-CM | POA: Insufficient documentation

## 2014-07-15 DIAGNOSIS — Z87891 Personal history of nicotine dependence: Secondary | ICD-10-CM | POA: Diagnosis not present

## 2014-07-15 DIAGNOSIS — I2510 Atherosclerotic heart disease of native coronary artery without angina pectoris: Secondary | ICD-10-CM | POA: Diagnosis present

## 2014-07-15 NOTE — Telephone Encounter (Signed)
Notes Recorded by Barkley Boards, RN on 07/15/2014 at 2:32 PM Pt aware of results by phone.  Notes Recorded by Wellington Hampshire, MD on 07/15/2014 at 1:50 PM Occluded left iliac artery but at least the right iliac artery is not completely occluded so we might be able to angioplasty. He should keep fu to discuss.

## 2014-07-28 ENCOUNTER — Encounter: Payer: Self-pay | Admitting: Cardiovascular Disease

## 2014-07-28 ENCOUNTER — Ambulatory Visit (INDEPENDENT_AMBULATORY_CARE_PROVIDER_SITE_OTHER): Payer: Medicare Other | Admitting: Cardiovascular Disease

## 2014-07-28 VITALS — BP 160/52 | HR 75 | Ht 66.0 in | Wt 158.0 lb

## 2014-07-28 DIAGNOSIS — I739 Peripheral vascular disease, unspecified: Secondary | ICD-10-CM

## 2014-07-28 DIAGNOSIS — I10 Essential (primary) hypertension: Secondary | ICD-10-CM | POA: Diagnosis not present

## 2014-07-28 LAB — CBC
HEMATOCRIT: 37.9 % — AB (ref 39.0–52.0)
Hemoglobin: 12.8 g/dL — ABNORMAL LOW (ref 13.0–17.0)
MCHC: 33.9 g/dL (ref 30.0–36.0)
MCV: 96.2 fl (ref 78.0–100.0)
Platelets: 185 10*3/uL (ref 150.0–400.0)
RBC: 3.94 Mil/uL — ABNORMAL LOW (ref 4.22–5.81)
RDW: 13.7 % (ref 11.5–15.5)
WBC: 6.9 10*3/uL (ref 4.0–10.5)

## 2014-07-28 LAB — BASIC METABOLIC PANEL
BUN: 26 mg/dL — AB (ref 6–23)
CALCIUM: 9.4 mg/dL (ref 8.4–10.5)
CO2: 26 mEq/L (ref 19–32)
CREATININE: 1.48 mg/dL (ref 0.40–1.50)
Chloride: 107 mEq/L (ref 96–112)
GFR: 47.92 mL/min — AB (ref >60.00)
GLUCOSE: 84 mg/dL (ref 70–99)
Potassium: 4.3 mEq/L (ref 3.5–5.1)
Sodium: 140 mEq/L (ref 135–145)

## 2014-07-28 LAB — PROTIME-INR
INR: 0.9 ratio (ref 0.8–1.0)
Prothrombin Time: 10.5 s (ref 9.6–13.1)

## 2014-07-28 NOTE — Progress Notes (Signed)
Cardiology Office Note   Date:  07/28/2014   ID:  Danny [name], DOB Mar 27, 1926, MRN [id number]  PCP:  Irven Shelling, MD  Cardiologist: Dr. Tamala Julian  No chief complaint on file.     History of Present Illness: Danny [name] is a 81 y.o. male who is here today for follow-up visit regarding severe claudication due to peripheral arterial disease. He has known history of coronary artery disease status post CABG more than 10 years ago. He had bilateral carotid endarterectomy, chronic kidney disease, hypertension and hyperlipidemia. He quit smoking more than 20 years ago. He has been stable from a cardiac standpoint. However, he has been having progressive symptoms of bilateral hip and thigh discomfort with walking. This is now happening with very short distance of 25 yards. He reports discomfort at night at rest which wakes him up from sleep. He is independent and lives by himself. He is usually very active around the house and yard work. However, he has not been able to do much lately because of his claudication.  He had recent noninvasive evaluation which showed an ABI of 0.41 on the right and 0.34 on the left. There was mild nonobstructive SFA disease and three-vessel runoff bilaterally. There were monophasic waveforms throughout bilateral lower extremities suggestive of significant inflow disease. 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 The right common iliac artery was patent but severely stenosed. I attempted treatment with cilostazol but he developed diarrhea and did not tolerate the medication. He has underlying chronic kidney disease.    Past Medical History  Diagnosis Date  . Hypertension   . Hyperlipidemia   . Pneumonia   . ED (erectile dysfunction)   . Coronary artery disease   . Chronic kidney disease   . PVD (peripheral vascular disease)   . Cerebrovascular disease   . Syncope   . Insomnia     Past Surgical History  Procedure Laterality Date   . Coronary artery bypass graft    . Hernia repair    . Salivary gland surgery    . Carotid endarterectomy  bilateral  . Coronary artery bypass graft    . Endarterectomy       Current Outpatient Prescriptions  Medication Sig Dispense Refill  . amitriptyline (ELAVIL) 10 MG tablet Take 1 tablet by mouth daily.    Marland Kitchen amLODipine (NORVASC) 10 MG tablet Take 10 mg by mouth daily.    Marland Kitchen aspirin EC 81 MG tablet Take 81 mg by mouth daily.    Marland Kitchen atorvastatin (LIPITOR) 40 MG tablet Take 40 mg by mouth daily.    . cholecalciferol (VITAMIN D) 1000 UNITS tablet Take 1,000 Units by mouth daily.    . cilostazol (PLETAL) 50 MG tablet Take 1 tablet (50 mg total) by mouth 2 (two) times daily. 60 tablet 6  . lisinopril (PRINIVIL,ZESTRIL) 20 MG tablet Take 20 mg by mouth daily.    . metoprolol (LOPRESSOR) 50 MG tablet Take 50 mg by mouth 2 (two) times daily.    . nitroGLYCERIN (NITROSTAT) 0.4 MG SL tablet Place 0.4 mg under the tongue every 5 (five) minutes as needed. For cheat pain.     No current facility-administered medications for this visit.    Allergies:   Clindamycin/lincomycin    Social History:  The patient  reports that he has quit smoking. He does not have any smokeless tobacco history on file. He reports that he does not drink alcohol or use illicit drugs.   Family History:  The patient's family history includes Heart failure in his father and mother.    ROS:  Please see the history of present illness.   Otherwise, review of systems are positive for discoloration and lower extremity. Family also suggested there is diminished cognitive function.   All other systems are reviewed and negative.    PHYSICAL EXAM: VS:  BP 160/52 mmHg  Pulse 75  Ht 5\' 6"  (1.676 m)  Wt 158 lb (71.668 kg)  BMI 25.51 kg/m2 , BMI Body mass index is 25.51 kg/(m^2). GEN: Well nourished, well developed, in no acute distress HEENT: normal Neck: no JVD, carotid bruits, or masses. Right carotid bruit is  heard. Cardiac: RRR; rubs, or gallops,no edema . Respiratory:  clear to auscultation bilaterally, normal work of breathing GI: soft, nontender, nondistended, + BS MS: no deformity or atrophy Skin: warm and dry, no rash Neuro:  Strength and sensation are intact Psych: euthymic mood, full affect Vascular: Femoral pulses are absent bilaterally. Distal pulses are not palpable. Radial pulses are normal bilaterally.     Recent Labs: 05/23/2014: BUN 25*; Creatinine 1.63*; Hemoglobin 10.7*; Platelets 171; Potassium 4.6; Sodium 138    Lipid Panel No results found for: CHOL, TRIG, HDL, CHOLHDL, VLDL, LDLCALC, LDLDIRECT    Wt Readings from Last 3 Encounters:  07/28/14 158 lb (71.668 kg)  07/14/14 159 lb 12.8 oz (72.485 kg)  06/08/14 162 lb 6.4 oz (73.664 kg)        ASSESSMENT AND PLAN:  1. Peripheral arterial disease: The patient has severe lifestyle limiting claudication bilaterally due to inflow disease.  Given his age, he is likely a poor candidate for aortobifemoral bypass. Endovascular intervention is somewhat limited due to chronic kidney disease and risk of contrast-induced nephropathy. At the same time, the patient is severely limited by his symptoms. He did not tolerate treatment with cilostazol due to diarrhea. I think the best option is to proceed with abdominal aortogram with bilateral iliac angiography and possible endovascular intervention with use of CO2 angiography and trying to minimize contrast. If the left iliac occlusion is not long, I will attempt endovascular intervention on that as well. The right sided iliac artery is not included so hopefully endovascular intervention is feasible on that side. The alternative would be to intervene percutaneously on the right side and reevaluate his symptoms. If no significant improvement on the left side, then a right to left femorofemoral bypass can be considered.  2. Atherosclerosis of native coronary artery of native heart with  other form of angina pectoris:  He is stable with no significant angina. Continue medical therapy.  3. Bilateral carotid stenosis: Carotid Doppler showed 40-59% bilateral ICA stenosis. No indication for revascularization. Continue treatment of risk factors.   Signed, Kathlyn Sacramento, MD  07/28/2014 9:10 AM

## 2014-07-28 NOTE — Patient Instructions (Addendum)
Medication Instructions:  Your physician recommends that you continue on your current medications as directed. Please refer to the Current Medication list given to you today.  Labwork: Your physician recommends that you have lab work today: BMP, CBC, PT/INR   Testing/Procedures: Your physician has requested that you have a peripheral vascular angiogram. This exam is performed at the hospital. During this exam IV contrast is used to look at arterial blood flow. Please review the information sheet given for details.   Follow-Up: We will arrange further follow-up after your angiogram.    Any Other Special Instructions Will Be Listed Below (If Applicable).

## 2014-08-05 ENCOUNTER — Encounter (HOSPITAL_COMMUNITY)
Admission: RE | Disposition: A | Discharge status: Home / Self Care | Payer: Medicare Other | Source: Ambulatory Visit | Attending: Cardiovascular Disease

## 2014-08-05 ENCOUNTER — Ambulatory Visit (HOSPITAL_COMMUNITY)
Admission: RE | Admit: 2014-08-05 | Discharge: 2014-08-06 | Disposition: A | Discharge status: Home / Self Care | Payer: Medicare Other | Source: Ambulatory Visit | Attending: Cardiovascular Disease | Admitting: Cardiovascular Disease

## 2014-08-05 ENCOUNTER — Encounter (HOSPITAL_COMMUNITY): Payer: Self-pay | Admitting: Cardiovascular Disease

## 2014-08-05 DIAGNOSIS — I2510 Atherosclerotic heart disease of native coronary artery without angina pectoris: Secondary | ICD-10-CM | POA: Insufficient documentation

## 2014-08-05 DIAGNOSIS — I6523 Occlusion and stenosis of bilateral carotid arteries: Secondary | ICD-10-CM | POA: Diagnosis not present

## 2014-08-05 DIAGNOSIS — I252 Old myocardial infarction: Secondary | ICD-10-CM | POA: Insufficient documentation

## 2014-08-05 DIAGNOSIS — I70213 Atherosclerosis of native arteries of extremities with intermittent claudication, bilateral legs: Secondary | ICD-10-CM | POA: Insufficient documentation

## 2014-08-05 DIAGNOSIS — Z951 Presence of aortocoronary bypass graft: Secondary | ICD-10-CM | POA: Insufficient documentation

## 2014-08-05 DIAGNOSIS — I25119 Atherosclerotic heart disease of native coronary artery with unspecified angina pectoris: Secondary | ICD-10-CM | POA: Diagnosis not present

## 2014-08-05 DIAGNOSIS — Z7982 Long term (current) use of aspirin: Secondary | ICD-10-CM | POA: Diagnosis not present

## 2014-08-05 DIAGNOSIS — I739 Peripheral vascular disease, unspecified: Secondary | ICD-10-CM | POA: Diagnosis present

## 2014-08-05 DIAGNOSIS — N183 Chronic kidney disease, stage 3 (moderate): Secondary | ICD-10-CM | POA: Diagnosis not present

## 2014-08-05 DIAGNOSIS — E785 Hyperlipidemia, unspecified: Secondary | ICD-10-CM | POA: Insufficient documentation

## 2014-08-05 DIAGNOSIS — I129 Hypertensive chronic kidney disease with stage 1 through stage 4 chronic kidney disease, or unspecified chronic kidney disease: Secondary | ICD-10-CM | POA: Insufficient documentation

## 2014-08-05 HISTORY — DX: Chronic kidney disease, stage 3 unspecified: N18.30

## 2014-08-05 HISTORY — DX: Chronic kidney disease, stage 3 (moderate): N18.3

## 2014-08-05 HISTORY — DX: Basal cell carcinoma of skin of nose: C44.311

## 2014-08-05 HISTORY — PX: PERIPHERAL VASCULAR CATHETERIZATION: SHX172C

## 2014-08-05 HISTORY — DX: Disorder of arteries and arterioles, unspecified: I77.9

## 2014-08-05 HISTORY — DX: Other specified symptoms and signs involving the circulatory and respiratory systems: R09.89

## 2014-08-05 HISTORY — DX: Peripheral vascular disease, unspecified: I73.9

## 2014-08-05 HISTORY — DX: Acute myocardial infarction, unspecified: I21.9

## 2014-08-05 HISTORY — DX: Personal history of other diseases of the musculoskeletal system and connective tissue: Z87.39

## 2014-08-05 LAB — POCT ACTIVATED CLOTTING TIME
Activated Clotting Time: 202 seconds
Activated Clotting Time: 220 seconds

## 2014-08-05 SURGERY — ABDOMINAL AORTOGRAM

## 2014-08-05 MED ORDER — AMITRIPTYLINE HCL 10 MG PO TABS
10.0000 mg | ORAL_TABLET | Freq: Every day | ORAL | Status: DC
  Filled 2014-08-05: qty 1

## 2014-08-05 MED ORDER — NITROGLYCERIN 0.4 MG SL SUBL: 0.4000 mg | SUBLINGUAL_TABLET | SUBLINGUAL | Status: DC | PRN

## 2014-08-05 MED ORDER — CLOPIDOGREL BISULFATE 300 MG PO TABS
ORAL_TABLET | ORAL | Status: AC
  Filled 2014-08-05: qty 1

## 2014-08-05 MED ORDER — SODIUM CHLORIDE 0.9 % IJ SOLN: 3.0000 mL | INTRAMUSCULAR | Status: DC | PRN

## 2014-08-05 MED ORDER — SODIUM CHLORIDE 0.9 % IJ SOLN: 3.0000 mL | Freq: Two times a day (BID) | INTRAMUSCULAR | Status: DC

## 2014-08-05 MED ORDER — ATORVASTATIN CALCIUM 40 MG PO TABS
40.0000 mg | ORAL_TABLET | Freq: Every day | ORAL | Status: DC
  Filled 2014-08-05: qty 1

## 2014-08-05 MED ORDER — IODIXANOL 320 MG/ML IV SOLN
INTRAVENOUS | Status: DC | PRN
  Administered 2014-08-05: 50 mL via INTRA_ARTERIAL

## 2014-08-05 MED ORDER — FENTANYL CITRATE (PF) 100 MCG/2ML IJ SOLN
INTRAMUSCULAR | Status: AC
Start: 2014-08-05 — End: 2014-08-05
  Filled 2014-08-05: qty 2

## 2014-08-05 MED ORDER — HEPARIN SODIUM (PORCINE) 1000 UNIT/ML IJ SOLN
INTRAMUSCULAR | Status: AC
  Filled 2014-08-05: qty 1

## 2014-08-05 MED ORDER — AMLODIPINE BESYLATE 5 MG PO TABS
10.0000 mg | ORAL_TABLET | Freq: Every day | ORAL | Status: DC
  Administered 2014-08-05 – 2014-08-06 (×2): 10 mg via ORAL
  Filled 2014-08-05 (×3): qty 2

## 2014-08-05 MED ORDER — VITAMIN D 1000 UNITS PO TABS
1000.0000 [IU] | ORAL_TABLET | Freq: Every day | ORAL | Status: DC
Start: 2014-08-05 — End: 2014-08-06
  Administered 2014-08-05: 21:00:00 1000 [IU] via ORAL
  Filled 2014-08-05: qty 1

## 2014-08-05 MED ORDER — LISINOPRIL 20 MG PO TABS
20.0000 mg | ORAL_TABLET | Freq: Every day | ORAL | Status: DC
  Administered 2014-08-06: 11:00:00 20 mg via ORAL
  Filled 2014-08-05: qty 1
  Filled 2014-08-05 (×2): qty 2
  Filled 2014-08-05: qty 1
  Filled 2014-08-05: qty 2

## 2014-08-05 MED ORDER — LIDOCAINE HCL (PF) 1 % IJ SOLN
INTRAMUSCULAR | Status: AC
  Filled 2014-08-05: qty 30

## 2014-08-05 MED ORDER — AMITRIPTYLINE HCL 10 MG PO TABS
10.0000 mg | ORAL_TABLET | Freq: Every day | ORAL | Status: DC
  Administered 2014-08-05: 22:00:00 10 mg via ORAL
  Filled 2014-08-05 (×3): qty 1

## 2014-08-05 MED ORDER — ATORVASTATIN CALCIUM 40 MG PO TABS
40.0000 mg | ORAL_TABLET | Freq: Every day | ORAL | Status: DC
  Administered 2014-08-05: 40 mg via ORAL
  Filled 2014-08-05: qty 1

## 2014-08-05 MED ORDER — SODIUM CHLORIDE 0.9 % IV SOLN: 250.0000 mL | INTRAVENOUS | Status: DC | PRN

## 2014-08-05 MED ORDER — SODIUM CHLORIDE 0.9 % WEIGHT BASED INFUSION
3.0000 mL/kg/h | INTRAVENOUS | Status: DC
  Administered 2014-08-05: 3 mL/kg/h via INTRAVENOUS

## 2014-08-05 MED ORDER — SODIUM CHLORIDE 0.9 % WEIGHT BASED INFUSION
1.0000 mL/kg/h | INTRAVENOUS | Status: DC
  Administered 2014-08-05: 1 mL/kg/h via INTRAVENOUS

## 2014-08-05 MED ORDER — CLOPIDOGREL BISULFATE 75 MG PO TABS
75.0000 mg | ORAL_TABLET | Freq: Every day | ORAL | Status: DC
  Administered 2014-08-06: 09:00:00 75 mg via ORAL
  Filled 2014-08-05: qty 1

## 2014-08-05 MED ORDER — ASPIRIN 81 MG PO CHEW: 81.0000 mg | CHEWABLE_TABLET | ORAL | Status: DC

## 2014-08-05 MED ORDER — MIDAZOLAM HCL 2 MG/2ML IJ SOLN
INTRAMUSCULAR | Status: AC
  Filled 2014-08-05: qty 2

## 2014-08-05 MED ORDER — SODIUM CHLORIDE 0.9 % IV SOLN
INTRAVENOUS | Status: AC
  Administered 2014-08-05: 18:00:00 via INTRAVENOUS

## 2014-08-05 MED ORDER — ASPIRIN EC 81 MG PO TBEC
81.0000 mg | DELAYED_RELEASE_TABLET | Freq: Every day | ORAL | Status: DC
  Administered 2014-08-06: 09:00:00 81 mg via ORAL
  Filled 2014-08-05: qty 1

## 2014-08-05 MED ORDER — HEPARIN (PORCINE) IN NACL 2-0.9 UNIT/ML-% IJ SOLN
INTRAMUSCULAR | Status: AC
  Filled 2014-08-05: qty 1000

## 2014-08-05 MED ORDER — METOPROLOL TARTRATE 50 MG PO TABS
50.0000 mg | ORAL_TABLET | Freq: Two times a day (BID) | ORAL | Status: DC
  Administered 2014-08-05 – 2014-08-06 (×2): 50 mg via ORAL
  Filled 2014-08-05: qty 2
  Filled 2014-08-05 (×2): qty 1
  Filled 2014-08-05: qty 2
  Filled 2014-08-05: qty 1
  Filled 2014-08-05: qty 2
  Filled 2014-08-05: qty 1

## 2014-08-05 MED ORDER — ACETAMINOPHEN 325 MG PO TABS: 650.0000 mg | ORAL_TABLET | ORAL | Status: DC | PRN

## 2014-08-05 MED ORDER — ONDANSETRON HCL 4 MG/2ML IJ SOLN: 4.0000 mg | Freq: Four times a day (QID) | INTRAMUSCULAR | Status: DC | PRN

## 2014-08-05 MED ORDER — HEPARIN SODIUM (PORCINE) 1000 UNIT/ML IJ SOLN
INTRAMUSCULAR | Status: DC | PRN
  Administered 2014-08-05: 2000 [IU] via INTRAVENOUS
  Administered 2014-08-05: 6000 [IU] via INTRAVENOUS

## 2014-08-05 MED ORDER — MIDAZOLAM HCL 2 MG/2ML IJ SOLN
INTRAMUSCULAR | Status: DC | PRN
  Administered 2014-08-05: 1 mg via INTRAVENOUS

## 2014-08-05 MED ORDER — FENTANYL CITRATE (PF) 100 MCG/2ML IJ SOLN
INTRAMUSCULAR | Status: DC | PRN
  Administered 2014-08-05: 25 ug via INTRAVENOUS
  Administered 2014-08-05: 50 ug via INTRAVENOUS

## 2014-08-05 MED ORDER — VITAMIN D3 25 MCG (1000 UNIT) PO TABS
1000.0000 [IU] | ORAL_TABLET | Freq: Every day | ORAL | Status: DC
  Filled 2014-08-05 (×2): qty 1

## 2014-08-05 SURGICAL SUPPLY — 26 items
BALLOON POWERFLX PRO 6X20X80 (BALLOONS) ×2 IMPLANT
CATH NAVICROSS ST .035X90CM (MICROCATHETER) ×2 IMPLANT
CATH OMNI FLUSH 5F 65CM (CATHETERS) ×2 IMPLANT
COVER PRB 48X5XTLSCP FOLD TPE (BAG) IMPLANT
COVER PROBE 5X48 (BAG) ×4
DEVICE CLOSURE MYNXGRIP 6/7F (Vascular Products) ×4 IMPLANT
FILTER CO2 0.2 MICRON (VASCULAR PRODUCTS) ×2 IMPLANT
GUIDEWIRE ANGLED .035X150CM (WIRE) ×2 IMPLANT
KIT ENCORE 26 ADVANTAGE (KITS) ×4 IMPLANT
KIT MICROINTRODUCER STIFF 5F (SHEATH) ×2 IMPLANT
KIT PV (KITS) ×4 IMPLANT
RESERVOIR CO2 (VASCULAR PRODUCTS) ×2 IMPLANT
SET AVANTA SINGLE PATIENT (MISCELLANEOUS) ×2 IMPLANT
SET FLUSH CO2 (MISCELLANEOUS) ×2 IMPLANT
SHEATH AVANTA HAND CONTROLLER (MISCELLANEOUS) ×2 IMPLANT
SHEATH BRITE TIP 6FR 35CM (SHEATH) ×4 IMPLANT
SHEATH PINNACLE 5F 10CM (SHEATH) ×2 IMPLANT
SHEATH PINNACLE 6F 10CM (SHEATH) ×2 IMPLANT
STENT GENESIS OPTA 7X24X80 (Permanent Stent) ×2 IMPLANT
STENT GENESIS OPTA 7X39X80 (Permanent Stent) ×2 IMPLANT
TAPE RADIOPAQUE TURBO (MISCELLANEOUS) ×4 IMPLANT
TRANSDUCER W/STOPCOCK (MISCELLANEOUS) ×4 IMPLANT
TRAY PV CATH (CUSTOM PROCEDURE TRAY) ×4 IMPLANT
TUBING CIL FLEX 10 FLL-RA (TUBING) ×2 IMPLANT
WIRE HITORQ VERSACORE ST 145CM (WIRE) ×4 IMPLANT
WIRE VERSACORE LOC 115CM (WIRE) ×2 IMPLANT

## 2014-08-05 NOTE — Interval H&P Note (Signed)
History and Physical Interval Note:  08/05/2014 11:40 AM  Danny [name]  has presented today for surgery, with the diagnosis of pvd  The various methods of treatment have been discussed with the patient and family. After consideration of risks, benefits and other options for treatment, the patient has consented to  Procedure(s): Abdominal Aortogram (N/A) as a surgical intervention .  The patient's history has been reviewed, patient examined, no change in status, stable for surgery.  I have reviewed the patient's chart and labs.  Questions were answered to the patient's satisfaction.     Kathlyn Sacramento

## 2014-08-05 NOTE — H&P (View-Only) (Signed)
Cardiology Office Note   Date:  07/28/2014   ID:  Danny [name], DOB 1926-11-15, MRN [id number]  PCP:  Irven Shelling, MD  Cardiologist: Dr. Tamala Julian  No chief complaint on file.     History of Present Illness: Danny [name] is a 81 y.o. male who is here today for follow-up visit regarding severe claudication due to peripheral arterial disease. He has known history of coronary artery disease status post CABG more than 10 years ago. He had bilateral carotid endarterectomy, chronic kidney disease, hypertension and hyperlipidemia. He quit smoking more than 20 years ago. He has been stable from a cardiac standpoint. However, he has been having progressive symptoms of bilateral hip and thigh discomfort with walking. This is now happening with very short distance of 25 yards. He reports discomfort at night at rest which wakes him up from sleep. He is independent and lives by himself. He is usually very active around the house and yard work. However, he has not been able to do much lately because of his claudication.  He had recent noninvasive evaluation which showed an ABI of 0.41 on the right and 0.34 on the left. There was mild nonobstructive SFA disease and three-vessel runoff bilaterally. There were monophasic waveforms throughout bilateral lower extremities suggestive of significant inflow disease. 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 The right common iliac artery was patent but severely stenosed. I attempted treatment with cilostazol but he developed diarrhea and did not tolerate the medication. He has underlying chronic kidney disease.    Past Medical History  Diagnosis Date  . Hypertension   . Hyperlipidemia   . Pneumonia   . ED (erectile dysfunction)   . Coronary artery disease   . Chronic kidney disease   . PVD (peripheral vascular disease)   . Cerebrovascular disease   . Syncope   . Insomnia     Past Surgical History  Procedure Laterality Date   . Coronary artery bypass graft    . Hernia repair    . Salivary gland surgery    . Carotid endarterectomy  bilateral  . Coronary artery bypass graft    . Endarterectomy       Current Outpatient Prescriptions  Medication Sig Dispense Refill  . amitriptyline (ELAVIL) 10 MG tablet Take 1 tablet by mouth daily.    Marland Kitchen amLODipine (NORVASC) 10 MG tablet Take 10 mg by mouth daily.    Marland Kitchen aspirin EC 81 MG tablet Take 81 mg by mouth daily.    Marland Kitchen atorvastatin (LIPITOR) 40 MG tablet Take 40 mg by mouth daily.    . cholecalciferol (VITAMIN D) 1000 UNITS tablet Take 1,000 Units by mouth daily.    . cilostazol (PLETAL) 50 MG tablet Take 1 tablet (50 mg total) by mouth 2 (two) times daily. 60 tablet 6  . lisinopril (PRINIVIL,ZESTRIL) 20 MG tablet Take 20 mg by mouth daily.    . metoprolol (LOPRESSOR) 50 MG tablet Take 50 mg by mouth 2 (two) times daily.    . nitroGLYCERIN (NITROSTAT) 0.4 MG SL tablet Place 0.4 mg under the tongue every 5 (five) minutes as needed. For cheat pain.     No current facility-administered medications for this visit.    Allergies:   Clindamycin/lincomycin    Social History:  The patient  reports that he has quit smoking. He does not have any smokeless tobacco history on file. He reports that he does not drink alcohol or use illicit drugs.   Family History:  The patient's family history includes Heart failure in his father and mother.    ROS:  Please see the history of present illness.   Otherwise, review of systems are positive for discoloration and lower extremity. Family also suggested there is diminished cognitive function.   All other systems are reviewed and negative.    PHYSICAL EXAM: VS:  BP 160/52 mmHg  Pulse 75  Ht 5\' 6"  (1.676 m)  Wt 158 lb (71.668 kg)  BMI 25.51 kg/m2 , BMI Body mass index is 25.51 kg/(m^2). GEN: Well nourished, well developed, in no acute distress HEENT: normal Neck: no JVD, carotid bruits, or masses. Right carotid bruit is  heard. Cardiac: RRR; rubs, or gallops,no edema . Respiratory:  clear to auscultation bilaterally, normal work of breathing GI: soft, nontender, nondistended, + BS MS: no deformity or atrophy Skin: warm and dry, no rash Neuro:  Strength and sensation are intact Psych: euthymic mood, full affect Vascular: Femoral pulses are absent bilaterally. Distal pulses are not palpable. Radial pulses are normal bilaterally.     Recent Labs: 05/23/2014: BUN 25*; Creatinine 1.63*; Hemoglobin 10.7*; Platelets 171; Potassium 4.6; Sodium 138    Lipid Panel No results found for: CHOL, TRIG, HDL, CHOLHDL, VLDL, LDLCALC, LDLDIRECT    Wt Readings from Last 3 Encounters:  07/28/14 158 lb (71.668 kg)  07/14/14 159 lb 12.8 oz (72.485 kg)  06/08/14 162 lb 6.4 oz (73.664 kg)        ASSESSMENT AND PLAN:  1. Peripheral arterial disease: The patient has severe lifestyle limiting claudication bilaterally due to inflow disease.  Given his age, he is likely a poor candidate for aortobifemoral bypass. Endovascular intervention is somewhat limited due to chronic kidney disease and risk of contrast-induced nephropathy. At the same time, the patient is severely limited by his symptoms. He did not tolerate treatment with cilostazol due to diarrhea. I think the best option is to proceed with abdominal aortogram with bilateral iliac angiography and possible endovascular intervention with use of CO2 angiography and trying to minimize contrast. If the left iliac occlusion is not long, I will attempt endovascular intervention on that as well. The right sided iliac artery is not included so hopefully endovascular intervention is feasible on that side. The alternative would be to intervene percutaneously on the right side and reevaluate his symptoms. If no significant improvement on the left side, then a right to left femorofemoral bypass can be considered.  2. Atherosclerosis of native coronary artery of native heart with  other form of angina pectoris:  He is stable with no significant angina. Continue medical therapy.  3. Bilateral carotid stenosis: Carotid Doppler showed 40-59% bilateral ICA stenosis. No indication for revascularization. Continue treatment of risk factors.   Signed, Kathlyn Sacramento, MD  07/28/2014 9:10 AM

## 2014-08-06 ENCOUNTER — Other Ambulatory Visit: Payer: Self-pay | Admitting: Physician Assistant

## 2014-08-06 ENCOUNTER — Encounter (HOSPITAL_COMMUNITY): Payer: Self-pay | Admitting: Physician Assistant

## 2014-08-06 DIAGNOSIS — I739 Peripheral vascular disease, unspecified: Secondary | ICD-10-CM

## 2014-08-06 DIAGNOSIS — I70213 Atherosclerosis of native arteries of extremities with intermittent claudication, bilateral legs: Secondary | ICD-10-CM | POA: Diagnosis not present

## 2014-08-06 LAB — BASIC METABOLIC PANEL
Anion gap: 8 (ref 5–15)
BUN: 22 mg/dL — AB (ref 6–20)
CALCIUM: 9 mg/dL (ref 8.9–10.3)
CHLORIDE: 106 mmol/L (ref 101–111)
CO2: 24 mmol/L (ref 22–32)
Creatinine, Ser: 1.41 mg/dL — ABNORMAL HIGH (ref 0.61–1.24)
GFR calc Af Amer: 51 mL/min — ABNORMAL LOW (ref >60)
GFR calc non Af Amer: 44 mL/min — ABNORMAL LOW (ref >60)
Glucose, Bld: 118 mg/dL — ABNORMAL HIGH (ref 65–99)
Potassium: 4.6 mmol/L (ref 3.5–5.1)
SODIUM: 138 mmol/L (ref 135–145)

## 2014-08-06 MED ORDER — CLOPIDOGREL BISULFATE 75 MG PO TABS: 75.0000 mg | ORAL_TABLET | Freq: Every day | ORAL | Status: DC

## 2014-08-06 MED FILL — Heparin Sodium (Porcine) 2 Unit/ML in Sodium Chloride 0.9%: INTRAMUSCULAR | Qty: 2000 | Status: AC

## 2014-08-06 MED FILL — Lidocaine HCl Local Preservative Free (PF) Inj 1%: INTRAMUSCULAR | Qty: 30 | Status: AC

## 2014-08-06 NOTE — Discharge Summary (Signed)
Discharge Summary   Patient ID: OTHMAR FACTOR MRN: [id number], DOB/AGE: 13-Sep-1926 81 y.o. Admit date: 08/05/2014 D/C date:     08/06/2014  Primary Care Provider: Irven Shelling, MD Primary Cardiologist: Linard Millers; [place]  Primary Discharge Diagnoses:  1. PAD with lifestyle limiting claudication s/p right and left common iliac artery angioplasty with bilateral stent placement this admission 2. HTN with discrepant blood pressures in R/L arm 3. CAD s/p CABG 2002 4. CKD stage III 5. HLD  PMH:  Past Medical History  Diagnosis Date  . Hypertension   . Hyperlipidemia   . ED (erectile dysfunction)   . Coronary artery disease   . Cerebrovascular disease   . Syncope   . Insomnia   . Basal cell carcinoma of nose     "right side; burned it off"  . Myocardial infarction 2002  . History of gout   . Chronic kidney disease (CKD), stage III (moderate)   . PVD (peripheral vascular disease)     a. s/p right and left common iliac artery angioplasty with bilateral stent placement.  . Carotid artery disease     a. s/p prior carotid endarterectomy.  . Unequal blood pressure in upper extremities    Hospital Course: Mr. Mathers is an 81 y/o M with history of CAD s/p CABG 2002, HTN, HLD, ED, CKD stage III who was recently seen by Dr. Fletcher Anon for claudication. He has history of carotid artery disease s/p prior carotid endarterectomy. He was recently complaining of progressive symptoms of bilateral hip and thigh discomfort with walking. He has not had any recent chest pain or dyspnea. He had recent noninvasive evaluation which showed an ABI of 0.41 on the right and 0.34 on the left. There was mild nonobstructive SFA disease and three-vessel runoff bilaterally. There were monophasic waveforms throughout bilateral lower extremities suggestive of significant inflow disease. 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 The right common iliac artery was patent but severely stenosed. Dr.  Fletcher Anon attempted treatment with cilostazol but he developed diarrhea and did not tolerate the medication. Mr. Hillers was brought in for [facility] angio with CO2 angiography and was found to have  severe right common iliac artery stenosis and occluded left common iliac artery. He underwent successful bilateral kissing stent placement to the common iliac arteries into the distal aorta. He was hydrated overnight. Only 12mL contrast was used. Cr stable this AM at 1.41. Of note the patient was noted to have discrepancy between left and right arm blood pressures (99/34 and 149/43 respectively). Last carotid duplex 06/2014 did not show any subclavial stenosis but did show decrease in amplitude notedin left branchial artery, suggesting some degree of PVD present. His home medications will be continued with the addition of Plavix. Ultimate duration of Plavix to be determined by Dr. Fletcher Anon - but will continue at least 1 month. ABI's and aortoiliac duplex 2 weeks with follow-up arranged. Dr. Burt Knack has seen and examined the patient today and feels he is stable for discharge.    Discharge Vitals: Blood pressure 149/43, pulse 73, temperature 98 F (36.7 C), temperature source Oral, resp. rate 12, height 5\' 6"  (1.676 m), weight 156 lb 8.4 oz (71 kg), SpO2 98 %.  Labs: Lab Results  Component Value Date   WBC 6.9 07/28/2014   HGB 12.8* 07/28/2014   HCT 37.9* 07/28/2014   MCV 96.2 07/28/2014   PLT 185.0 07/28/2014     Recent Labs Lab 08/06/14 0942  NA 138  K  4.6  CL 106  CO2 24  BUN 22*  CREATININE 1.41*  CALCIUM 9.0  GLUCOSE 118*    Diagnostic Studies/Procedures   Cardiac catheterization this admission, please see full report and above for summary.   Discharge Medications   Current Discharge Medication List    START taking these medications   Details  clopidogrel (PLAVIX) 75 MG tablet Take 1 tablet (75 mg total) by mouth daily. Qty: 30 tablet, Refills: 6      CONTINUE these medications which have NOT  CHANGED   Details  amitriptyline (ELAVIL) 10 MG tablet Take 1 tablet by mouth daily.    amLODipine (NORVASC) 10 MG tablet Take 10 mg by mouth daily.    aspirin EC 81 MG tablet Take 81 mg by mouth daily.    atorvastatin (LIPITOR) 40 MG tablet Take 40 mg by mouth daily.    cholecalciferol (VITAMIN D) 1000 UNITS tablet Take 1,000 Units by mouth daily.    lisinopril (PRINIVIL,ZESTRIL) 20 MG tablet Take 20 mg by mouth daily.    metoprolol (LOPRESSOR) 50 MG tablet Take 50 mg by mouth 2 (two) times daily.    nitroGLYCERIN (NITROSTAT) 0.4 MG SL tablet Place 0.4 mg under the tongue every 5 (five) minutes as needed. For cheat pain.        Disposition   The patient will be discharged in stable condition to home. Discharge Instructions    Diet - low sodium heart healthy    Complete by:  As directed      Increase activity slowly    Complete by:  As directed   No driving for 2 days. No lifting over 5 lbs for 1 week. No sexual activity for 1 week. Keep procedure site clean & dry. If you notice increased pain, swelling, bleeding or pus, call/return!  You may shower, but no soaking baths/hot tubs/pools for 1 week.   At your follow-up appointment, please ask Dr. Fletcher Anon how long he would like you to continue the Plavix.          Follow-up Information    Follow up with [facility].   Specialty:  Cardiology   Why:  Lower extremity ultrasound recheck - 08/14/14 at 10:30am   Contact information:   [address] [place] [phone]      Follow up with Kathlyn Sacramento, MD.   Specialty:  Cardiology   Why:  09/01/14 at [place] information:   [address] [place] [address] [phone]         Duration of Discharge Encounter: Greater than 30 minutes including physician and PA time.  Rudean Hitt, Yahaira Bruski PA-C 08/06/2014, 10:54 AM

## 2014-08-06 NOTE — Progress Notes (Addendum)
Patient: Danny [name] / Admit Date: 08/05/2014 / Date of Encounter: 08/06/2014, 9:03 AM   Subjective: Shivaay Recher is an 81 year old male with a PMH significant for PAD, CAD s/p CABG in 2002, HTN, HLD, and carotid stenosis s/p edarterectomy presenting with progressive claudication of the lower extremities. He underwent R and L common iliac artery angioplasty with bilateral stent placement yesterday.   Today he feels well and has no acute complaints. No claudication or paresthesias in lower extremities overnight. No bruising or bleeding from groin puncture sites. No chest pain, dyspnea, palpitations, or orthopnea.    Objective: Telemetry: SR with infrequent PACs Physical Exam: Blood pressure 149/43, pulse 73, temperature 98.2 F (36.8 C), temperature source Oral, resp. rate 14, height 5\' 6"  (1.676 m), weight 71 kg (156 lb 8.4 oz), SpO2 98 %. General: Elderly, well developed, well nourished, and in no acute distress. Smiling. Head: Normocephalic, atraumatic, sclera non-icteric, no xanthomas, nares are without discharge. Neck: Carotid bruit auscultated on the right side; no thrill. JVP not elevated. Lungs: Clear bilaterally to auscultation without wheezes, rales, or rhonchi. Breathing is unlabored. Heart: RRR S1 S2 without murmurs, rubs, or gallops.  Abdomen: Soft, non-tender, non-distended with normoactive bowel sounds. No rebound/guarding. Extremities: No clubbing or cyanosis. No edema. DP/PT pulses identified via doppler bilaterally. Bruising around L groin puncture site but no evidence of bleeding or hematoma. Bilateral groin puncture sites negative for bruit, hematoma. Minimal ecchymosis at the left groin site. Neuro: Alert and oriented X 3. Moves all extremities spontaneously. Psych:  Responds to questions appropriately with a normal affect.   Intake/Output Summary (Last 24 hours) at 08/06/14 0903 Last data filed at 08/05/14 2243  Gross per 24 hour  Intake    360 ml  Output   1175 ml   Net   -815 ml    Inpatient Medications:  . amitriptyline  10 mg Oral Q2000  . amLODipine  10 mg Oral Daily  . aspirin EC  81 mg Oral Daily  . atorvastatin  40 mg Oral Daily  . cholecalciferol  1,000 Units Oral Q2000  . clopidogrel  75 mg Oral Daily  . lisinopril  20 mg Oral Daily  . metoprolol  50 mg Oral BID  . sodium chloride  3 mL Intravenous Q12H   Infusions:    Labs:   Radiology/Studies:  No results found.   Assessment and Plan  1. PAD s/p R and L common iliac artery angioplasty with bilateral stent placement: abdominal aortogram showed severe right common iliac artery stenosis and occluded left common iliac artery. Bilateral kissing stent placement to the common iliac arteries into the distal aorta was successful. Patient without pain or claudication overnight. LE pulses identified via doppler. Dual antiplatelet therapy for at least one month. Awaiting BMP to assess renal function s/p aortogram.   2. Hypotension: large discrepancy between left and right arm blood pressures (99/34 and 149/43 respectively). I will discuss antihypertensive regimen with Dr. Burt Knack.  3. CAD s/p 4 vessel CABG: Patient states he's not had chest pain or dyspnea since prior to CABG. Continue ASA, atorvastatin, lisinopril, and metoprolol.   4. CKD stage III: awaiting BMP to evaluate renal function. Voided 1.2 L since admission. Weight is down 1 kg from admission. I asked nursing to hold off AM BP meds until we know his Cr (also want to discuss BP discrepancy with MD).  5. HLD: continue atorvastatin 40 mg nightly.   Signed, Melina Copa PA-C Pager: [phone]   Patient seen,  examined. Available data reviewed. Agree with findings, assessment, and plan as outlined by Melina Copa, PA-C. Exam reveals pleasant elderly male in NAD. Bilateral groin sites are clear. Feet are warm. Awaiting BMET this am. Dapt with ASA and plavix. ABI's and aortoiliac duplex 2 weeks with follow-up. Reviewed post-cath instructions  with patient.   Sherren Mocha, M.D. 08/06/2014 10:04 AM

## 2014-08-06 NOTE — Progress Notes (Addendum)
Note signed in error. See DC summary.

## 2014-08-10 ENCOUNTER — Telehealth: Payer: Self-pay | Admitting: Physician Assistant

## 2014-08-10 ENCOUNTER — Emergency Department (HOSPITAL_COMMUNITY)
Admission: EM | Admit: 2014-08-10 | Discharge: 2014-08-10 | Disposition: A | Discharge status: Home / Self Care | Payer: Medicare Other | Attending: Emergency Medicine | Admitting: Emergency Medicine

## 2014-08-10 ENCOUNTER — Encounter (HOSPITAL_COMMUNITY): Payer: Self-pay | Admitting: Nurse Practitioner

## 2014-08-10 DIAGNOSIS — I9742 Intraoperative hemorrhage and hematoma of a circulatory system organ or structure complicating other procedure: Secondary | ICD-10-CM | POA: Diagnosis not present

## 2014-08-10 DIAGNOSIS — N183 Chronic kidney disease, stage 3 (moderate): Secondary | ICD-10-CM | POA: Diagnosis not present

## 2014-08-10 DIAGNOSIS — Z955 Presence of coronary angioplasty implant and graft: Secondary | ICD-10-CM | POA: Diagnosis not present

## 2014-08-10 DIAGNOSIS — I129 Hypertensive chronic kidney disease with stage 1 through stage 4 chronic kidney disease, or unspecified chronic kidney disease: Secondary | ICD-10-CM | POA: Diagnosis not present

## 2014-08-10 DIAGNOSIS — Z8739 Personal history of other diseases of the musculoskeletal system and connective tissue: Secondary | ICD-10-CM | POA: Insufficient documentation

## 2014-08-10 DIAGNOSIS — I2510 Atherosclerotic heart disease of native coronary artery without angina pectoris: Secondary | ICD-10-CM | POA: Diagnosis not present

## 2014-08-10 DIAGNOSIS — R58 Hemorrhage, not elsewhere classified: Secondary | ICD-10-CM

## 2014-08-10 DIAGNOSIS — Z79899 Other long term (current) drug therapy: Secondary | ICD-10-CM | POA: Insufficient documentation

## 2014-08-10 DIAGNOSIS — Z87438 Personal history of other diseases of male genital organs: Secondary | ICD-10-CM | POA: Insufficient documentation

## 2014-08-10 DIAGNOSIS — Z87891 Personal history of nicotine dependence: Secondary | ICD-10-CM | POA: Insufficient documentation

## 2014-08-10 DIAGNOSIS — E785 Hyperlipidemia, unspecified: Secondary | ICD-10-CM | POA: Diagnosis not present

## 2014-08-10 DIAGNOSIS — Z7982 Long term (current) use of aspirin: Secondary | ICD-10-CM | POA: Insufficient documentation

## 2014-08-10 DIAGNOSIS — I252 Old myocardial infarction: Secondary | ICD-10-CM | POA: Insufficient documentation

## 2014-08-10 DIAGNOSIS — Z8673 Personal history of transient ischemic attack (TIA), and cerebral infarction without residual deficits: Secondary | ICD-10-CM | POA: Insufficient documentation

## 2014-08-10 DIAGNOSIS — Z8669 Personal history of other diseases of the nervous system and sense organs: Secondary | ICD-10-CM | POA: Diagnosis not present

## 2014-08-10 DIAGNOSIS — Z85828 Personal history of other malignant neoplasm of skin: Secondary | ICD-10-CM | POA: Insufficient documentation

## 2014-08-10 DIAGNOSIS — Y831 Surgical operation with implant of artificial internal device as the cause of abnormal reaction of the patient, or of later complication, without mention of misadventure at the time of the procedure: Secondary | ICD-10-CM | POA: Diagnosis not present

## 2014-08-10 DIAGNOSIS — Z951 Presence of aortocoronary bypass graft: Secondary | ICD-10-CM | POA: Diagnosis not present

## 2014-08-10 NOTE — Discharge Instructions (Signed)
The mass and bruising in the left groin is likely related to the amount of work that was done during the artery stenting procedure. This should gradually improve over the next 1-3 weeks. To help the swelling improved, you can try using heat on the swollen area 3 or 4 times a day. Return here, if needed, for problems.

## 2014-08-10 NOTE — ED Notes (Signed)
Pt had stent procedure here on Wednesday, he now has a painless knot at the insertion site and his doctor referred him to ED for evaluation. No bleeding. His doctor entered notes in patients chart about this concern

## 2014-08-10 NOTE — ED Provider Notes (Signed)
CSN: [id number]     Arrival date & time 08/10/14  1137 History   First MD Initiated Contact with Patient 08/10/14 1151     Chief Complaint  Patient presents with  . Post-op Problem     (Consider location/radiation/quality/duration/timing/severity/associated sxs/prior Treatment) HPI   Danny [name] is a 81 y.o. male who presents for evaluation of left groin mass, with bruising, and concern for complication related to arterial puncture and arterial stenting. The patient had a bilateral groin catheterization with bilateral groin stenting, for peripheral vascular disease. These procedures were done on 08/05/14. His daughter is noticing increased bruising and is concerned about a mass in the left groin. Patient is ambulating well, and having minimal left groin pain. There's been no fever, nausea, vomiting, weakness or dizziness. He has mild left foot swelling and does not have pain in his left leg. He is taking his usual medications. There are no other known modifying factors.   Past Medical History  Diagnosis Date  . Hypertension   . Hyperlipidemia   . ED (erectile dysfunction)   . Coronary artery disease   . Cerebrovascular disease   . Syncope   . Insomnia   . Basal cell carcinoma of nose     "right side; burned it off"  . Myocardial infarction 2002  . History of gout   . Chronic kidney disease (CKD), stage III (moderate)   . PVD (peripheral vascular disease)     a. s/p right and left common iliac artery angioplasty with bilateral stent placement.  . Carotid artery disease     a. s/p prior carotid endarterectomy.  . Unequal blood pressure in upper extremities    Past Surgical History  Procedure Laterality Date  . Carotid endarterectomy Bilateral   . Peripheral vascular catheterization N/A 08/05/2014    Procedure: Abdominal Aortogram;  Surgeon: Wellington Hampshire, MD;  Location: [facility];  Service: Cardiovascular;  Laterality: N/A;  . Peripheral vascular catheterization  Bilateral 08/05/2014    Procedure: Peripheral Vascular Intervention;  Surgeon: Wellington Hampshire, MD;  Location: [facility];  Service: Cardiovascular;  Laterality: Bilateral;  iliacs  . Appendectomy    . Hernia repair    . Abdominal hernia repair  X 4  . Coronary angioplasty with stent placement      "~ 6 months before bypass"  . Coronary artery bypass graft  2002    "CABG X4"   Family History  Problem Relation Age of Onset  . Heart failure Mother   . Heart failure Father    History  Substance Use Topics  . Smoking status: Former Smoker -- 1.00 packs/day for 56 years    Types: Cigarettes  . Smokeless tobacco: Never Used     Comment: "stopped smoking when I had MI in 2002"  . Alcohol Use: No    Review of Systems  All other systems reviewed and are negative.     Allergies  Clindamycin/lincomycin  Home Medications   Prior to Admission medications   Medication Sig Start Date End Date Taking? Authorizing Provider  amitriptyline (ELAVIL) 10 MG tablet Take 1 tablet by mouth daily. 06/18/14   Historical Provider, MD  amLODipine (NORVASC) 10 MG tablet Take 10 mg by mouth daily.    Historical Provider, MD  aspirin EC 81 MG tablet Take 81 mg by mouth daily.    Historical Provider, MD  atorvastatin (LIPITOR) 40 MG tablet Take 40 mg by mouth daily.    Historical Provider, MD  cholecalciferol (VITAMIN D) 1000 UNITS tablet Take 1,000 Units by mouth daily.    Historical Provider, MD  clopidogrel (PLAVIX) 75 MG tablet Take 1 tablet (75 mg total) by mouth daily. 08/06/14   Dayna N Dunn, PA-C  lisinopril (PRINIVIL,ZESTRIL) 20 MG tablet Take 20 mg by mouth daily.    Historical Provider, MD  metoprolol (LOPRESSOR) 50 MG tablet Take 50 mg by mouth 2 (two) times daily.    Historical Provider, MD  nitroGLYCERIN (NITROSTAT) 0.4 MG SL tablet Place 0.4 mg under the tongue every 5 (five) minutes as needed. For cheat pain.    Historical Provider, MD   BP 171/50 mmHg  Pulse 68  Temp(Src) 97.4  F (36.3 C) (Oral)  Resp 16  SpO2 100% Physical Exam  Constitutional: He is oriented to person, place, and time. He appears well-developed and well-nourished.  HENT:  Head: Normocephalic and atraumatic.  Right Ear: External ear normal.  Left Ear: External ear normal.  Eyes: Conjunctivae and EOM are normal. Pupils are equal, round, and reactive to light.  Neck: Normal range of motion and phonation normal. Neck supple.  Cardiovascular: Normal rate, regular rhythm and normal heart sounds.   Right groin, with small nodule associated with an apparent needlestick site and mild distal ecchymosis. There is a palpable pulse in the femoral artery without palpable mass. Left groin, at site of skin puncture, has a 3 x 3 cm mass consistent with local hematoma. Beneath this, there is a palpable femoral pulse without mass or swelling. There is mild diffuse ecchymosis, flat, in the left groin, upper left anterior and left medial thigh. Normal dorsalis pedal pulses in both feet, with palpation by me.  Pulmonary/Chest: Effort normal and breath sounds normal. He exhibits no bony tenderness.  Abdominal: Soft. There is no tenderness.  Musculoskeletal: Normal range of motion.  Very minimal mild medial midfoot swelling. No calf tenderness or lower leg swelling  Neurological: He is alert and oriented to person, place, and time. No cranial nerve deficit or sensory deficit. He exhibits normal muscle tone. Coordination normal.  Skin: Skin is warm, dry and intact.  Psychiatric: He has a normal mood and affect. His behavior is normal. Judgment and thought content normal.  Nursing note and vitals reviewed.   ED Course  Procedures (including critical care time)  Medications - No data to display  Patient Vitals for the past 24 hrs:  BP Temp Temp src Pulse Resp SpO2  08/10/14 1157 (!) 171/50 mmHg 97.4 F (36.3 C) Oral - 16 100 %  08/10/14 1141 (!) 166/53 mmHg 97.4 F (36.3 C) Oral 68 18 100 %    13:10- discussed  the case with the on-call cardiologist, Dr. Meda Coffee, who agrees that the patient can be treated symptomatically; since there is no apparent vascular complication associated with the recent arterial stenting procedure.  1:19 PM Reevaluation with update and discussion. After initial assessment and treatment, an updated evaluation reveals clinical status is unchanged.. [place] Review Labs Reviewed - No data to display  Imaging Review No results found.   EKG Interpretation None      MDM   Final diagnoses:  Postoperative ecchymosis    Hematoma and ecchymosis, related to recent vascular access procedure. No apparent vessel injury. No evidence for infection.   Nursing Notes Reviewed/ Care Coordinated Applicable Imaging Reviewed Interpretation of Laboratory Data incorporated into ED treatment  The patient appears reasonably screened and/or stabilized for discharge and I doubt any other medical condition  or other [facility] requiring further screening, evaluation, or treatment in the ED at this time prior to discharge.  Plan: Home Medications- usual; Home Treatments- rest; return here if the recommended treatment, does not improve the symptoms; Recommended follow up- Cardiology and PCP prn  Daleen Bo, MD 08/10/14 [phone]

## 2014-08-10 NOTE — Telephone Encounter (Signed)
Patient's daughter called holiday answering service.  Patient had PV angio 08/05/14 - bilateral access points. Right side looks OK. Still has bruising on the left side which is unchanged. However, he has developed a new small round knot on that side. Left foot is a little swollen which is new as well. No pain but this is new since DC.  His daughter was wondering next step. Unfortunately I cannot exclude post-cath groin complication over the phone given this description. I advised they proceed to ED for further evaluation and likely groin ultrasound to exclude AV Fistula/pseudoaneurysm. She verbalized understanding and gratitude and they will proceed to ER. Sophea Rackham PA-C

## 2014-08-12 ENCOUNTER — Encounter (HOSPITAL_COMMUNITY): Payer: Medicare Other

## 2014-08-12 ENCOUNTER — Telehealth: Payer: Self-pay | Admitting: Cardiovascular Disease

## 2014-08-12 DIAGNOSIS — S301XXD Contusion of abdominal wall, subsequent encounter: Secondary | ICD-10-CM

## 2014-08-12 NOTE — Telephone Encounter (Signed)
New message         Pt had a stent put in on last Wednesday   pt has pain and a knot in the surgical area   pt was seen in the hospital on [place] but daughter wants to be sure that this is normal

## 2014-08-12 NOTE — Telephone Encounter (Signed)
I spoke with the pt's daughter and she said the pt is still worried about his groin site.  The pt still complains of a little discomfort in his groin but the knot has not increased in size.  She said the ER physician stated that he did not know a lot about checking a groin site and the pt lost all confidence. I made her aware that I would contact the pt about his groin.    I spoke with the pt and he complains of a little discomfort at his groin site.  The pt did have left groin  bruising following his procedure but no evidence of hematoma per hospital progress notes.  He states that the knot around his puncture site on left groin has not changed in size since his ER visit.  Per Dr Fletcher Anon an ultrasound of left groin can be performed to assess puncture site. The pt is scheduled for vascular studies on 08/14/14 and we will do testing at that time.

## 2014-08-14 ENCOUNTER — Ambulatory Visit (HOSPITAL_COMMUNITY): Payer: Medicare Other | Attending: Physician Assistant

## 2014-08-14 ENCOUNTER — Ambulatory Visit (HOSPITAL_COMMUNITY): Payer: Medicare Other

## 2014-08-14 ENCOUNTER — Telehealth: Payer: Self-pay | Admitting: Cardiovascular Disease

## 2014-08-14 ENCOUNTER — Ambulatory Visit (HOSPITAL_BASED_OUTPATIENT_CLINIC_OR_DEPARTMENT_OTHER): Payer: Medicare Other

## 2014-08-14 DIAGNOSIS — S301XXD Contusion of abdominal wall, subsequent encounter: Secondary | ICD-10-CM | POA: Diagnosis not present

## 2014-08-14 DIAGNOSIS — I739 Peripheral vascular disease, unspecified: Secondary | ICD-10-CM | POA: Insufficient documentation

## 2014-08-14 NOTE — Telephone Encounter (Signed)
Follow Up  Pt daughter called for Results from ECHO Please call

## 2014-08-14 NOTE — Telephone Encounter (Signed)
Called patient's DPR Danny [name]. Informed her that results are still in progress and after doctor has looked at results they will be contacted on Monday, most likely. [name] verbalized understanding.

## 2014-08-14 NOTE — Telephone Encounter (Signed)
Making sure this was routed

## 2014-09-01 ENCOUNTER — Ambulatory Visit (INDEPENDENT_AMBULATORY_CARE_PROVIDER_SITE_OTHER): Payer: Medicare Other | Admitting: Cardiovascular Disease

## 2014-09-01 ENCOUNTER — Encounter: Payer: Self-pay | Admitting: Cardiovascular Disease

## 2014-09-01 VITALS — BP 101/66 | HR 58 | Ht 66.0 in | Wt 156.1 lb

## 2014-09-01 DIAGNOSIS — I739 Peripheral vascular disease, unspecified: Secondary | ICD-10-CM | POA: Diagnosis not present

## 2014-09-01 DIAGNOSIS — N189 Chronic kidney disease, unspecified: Secondary | ICD-10-CM | POA: Diagnosis not present

## 2014-09-01 LAB — BASIC METABOLIC PANEL
BUN: 30 mg/dL — ABNORMAL HIGH (ref 6–23)
CALCIUM: 9.4 mg/dL (ref 8.4–10.5)
CO2: 24 meq/L (ref 19–32)
Chloride: 106 mEq/L (ref 96–112)
Creatinine, Ser: 1.51 mg/dL — ABNORMAL HIGH (ref 0.40–1.50)
GFR: 46.81 mL/min — ABNORMAL LOW (ref >60.00)
Glucose, Bld: 92 mg/dL (ref 70–99)
Potassium: 5 mEq/L (ref 3.5–5.1)
Sodium: 138 mEq/L (ref 135–145)

## 2014-09-01 NOTE — Progress Notes (Signed)
Cardiology Office Note   Date:  09/01/2014   ID:  Danny [name], DOB 09-12-1926, MRN [id number]  PCP:  Irven Shelling, MD  Cardiologist: Dr. Tamala Julian  Chief Complaint  Patient presents with  . Follow-up    PVD      History of Present Illness: Danny [name] is a 81 y.o. male who is here today for follow-up visit regarding severe claudication due to peripheral arterial disease. He has known history of coronary artery disease status post CABG more than 10 years ago. He had bilateral carotid endarterectomy, chronic kidney disease, hypertension and hyperlipidemia. He quit smoking more than 20 years ago. He was seen recently for progressive symptoms of bilateral hip and thigh discomfort with walking. He had recent noninvasive evaluation which showed an ABI of 0.41 on the right and 0.34 on the left. There was mild nonobstructive SFA disease and three-vessel runoff bilaterally. There were monophasic waveforms throughout bilateral lower extremities suggestive of significant inflow disease. 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 The right common iliac artery was patent but severely stenosed.  He failed medical therapy. I proceeded with CO2 angiography which confirmed severe right common iliac artery stenosis and occluded left common iliac artery. I performed successful bilateral kissing stent placement. He had no worsening of renal function. He did have bilateral bruising and a knot on the left side. Ultrasound showed no evidence of pseudoaneurysm or fistula. Noninvasive evaluation showed significant improvement in ABI. The stents were patent although there was high velocity in the aorta. He reports resolution of claudication.   Past Medical History  Diagnosis Date  . Hypertension   . Hyperlipidemia   . ED (erectile dysfunction)   . Coronary artery disease   . Cerebrovascular disease   . Syncope   . Insomnia   . Basal cell carcinoma of nose     "right side;  burned it off"  . Myocardial infarction 2002  . History of gout   . Chronic kidney disease (CKD), stage III (moderate)   . PVD (peripheral vascular disease)     a. s/p right and left common iliac artery angioplasty with bilateral stent placement.  . Carotid artery disease     a. s/p prior carotid endarterectomy.  . Unequal blood pressure in upper extremities     Past Surgical History  Procedure Laterality Date  . Carotid endarterectomy Bilateral   . Peripheral vascular catheterization N/A 08/05/2014    Procedure: Abdominal Aortogram;  Surgeon: Wellington Hampshire, MD;  Location: [facility];  Service: Cardiovascular;  Laterality: N/A;  . Peripheral vascular catheterization Bilateral 08/05/2014    Procedure: Peripheral Vascular Intervention;  Surgeon: Wellington Hampshire, MD;  Location: [facility];  Service: Cardiovascular;  Laterality: Bilateral;  iliacs  . Appendectomy    . Hernia repair    . Abdominal hernia repair  X 4  . Coronary angioplasty with stent placement      "~ 6 months before bypass"  . Coronary artery bypass graft  2002    "CABG X4"     Current Outpatient Prescriptions  Medication Sig Dispense Refill  . amitriptyline (ELAVIL) 10 MG tablet Take 1 tablet by mouth daily.    Marland Kitchen amLODipine (NORVASC) 10 MG tablet Take 10 mg by mouth daily.    Marland Kitchen aspirin EC 81 MG tablet Take 81 mg by mouth daily.    Marland Kitchen atorvastatin (LIPITOR) 40 MG tablet Take 40 mg by mouth daily.    . cholecalciferol (  VITAMIN D) 1000 UNITS tablet Take 1,000 Units by mouth daily.    . clopidogrel (PLAVIX) 75 MG tablet Take 1 tablet (75 mg total) by mouth daily. 30 tablet 6  . lisinopril (PRINIVIL,ZESTRIL) 20 MG tablet Take 20 mg by mouth daily.    . metoprolol (LOPRESSOR) 50 MG tablet Take 50 mg by mouth 2 (two) times daily.    . nitroGLYCERIN (NITROSTAT) 0.4 MG SL tablet Place 0.4 mg under the tongue every 5 (five) minutes as needed. For cheat pain.     No current facility-administered medications  for this visit.    Allergies:   Clindamycin/lincomycin    Social History:  The patient  reports that he has quit smoking. His smoking use included Cigarettes. He has a 56 pack-year smoking history. He has never used smokeless tobacco. He reports that he does not drink alcohol or use illicit drugs.   Family History:  The patient's family history includes Heart failure in his father and mother.    ROS:  Please see the history of present illness.   Otherwise, review of systems are positive for discoloration and lower extremity. Family also suggested there is diminished cognitive function.   All other systems are reviewed and negative.    PHYSICAL EXAM: VS:  BP 101/66 mmHg  Pulse 58  Ht 5\' 6"  (1.676 m)  Wt 156 lb 1.9 oz (70.816 kg)  BMI 25.21 kg/m2  SpO2 98% , BMI Body mass index is 25.21 kg/(m^2). GEN: Well nourished, well developed, in no acute distress HEENT: normal Neck: no JVD, carotid bruits, or masses. Right carotid bruit is heard. Cardiac: RRR; rubs, or gallops,no edema . Respiratory:  clear to auscultation bilaterally, normal work of breathing GI: soft, nontender, nondistended, + BS MS: no deformity or atrophy Skin: warm and dry, no rash Neuro:  Strength and sensation are intact Psych: euthymic mood, full affect Vascular: Femoral pulses are+1  bilaterally. Distal pulses are not palpable. Radial pulses are normal bilaterally.  No groin hematoma. Small scar tissue on the left side.   Recent Labs: 07/28/2014: Hemoglobin 12.8*; Platelets 185.0 08/06/2014: BUN 22*; Creatinine, Ser 1.41*; Potassium 4.6; Sodium 138    Lipid Panel No results found for: CHOL, TRIG, HDL, CHOLHDL, VLDL, LDLCALC, LDLDIRECT    Wt Readings from Last 3 Encounters:  09/01/14 156 lb 1.9 oz (70.816 kg)  08/06/14 156 lb 8.4 oz (71 kg)  07/28/14 158 lb (71.668 kg)        ASSESSMENT AND PLAN:  1. Peripheral arterial disease: Status post successful bilateral iliac kissing stent placement with  resolution of claudication. Continue dual antiplatelets therapy for few months. I requested an aortoiliac duplex and ABIs in 3 months from now. He can resume his physical activities.  2. Atherosclerosis of native coronary artery of native heart with other form of angina pectoris:  He is stable with no significant angina. Continue medical therapy.  3. Bilateral carotid stenosis: Carotid Doppler showed 40-59% bilateral ICA stenosis. No indication for revascularization. Continue treatment of risk factors.   Signed, Kathlyn Sacramento, MD  09/01/2014 8:11 AM

## 2014-09-01 NOTE — Patient Instructions (Signed)
Medication Instructions:  Your physician recommends that you continue on your current medications as directed. Please refer to the Current Medication list given to you today.  Labwork: Your physician recommends that you have lab work today: BMP  Testing/Procedures: Your physician has requested that you have an ankle brachial index (ABI) in 3 MONTHS. During this test an ultrasound and blood pressure cuff are used to evaluate the arteries that supply the arms and legs with blood. Allow thirty minutes for this exam. There are no restrictions or special instructions.  Your physician has requested that you have an aorto-iliac duplex in 3 MONTHS. Do not eat after midnight the day before and avoid carbonated beverages  Follow-Up: Your physician recommends that you schedule a follow-up appointment in: 3 MONTHS with Dr Fletcher Anon   Any Other Special Instructions Will Be Listed Below (If Applicable).

## 2014-09-15 ENCOUNTER — Encounter: Payer: Self-pay | Admitting: Cardiovascular Disease

## 2014-12-01 ENCOUNTER — Ambulatory Visit (HOSPITAL_COMMUNITY)
Admission: RE | Admit: 2014-12-01 | Discharge: 2014-12-01 | Disposition: A | Discharge status: Home / Self Care | Payer: Medicare Other | Source: Ambulatory Visit | Attending: Cardiovascular Disease | Admitting: Cardiovascular Disease

## 2014-12-01 ENCOUNTER — Ambulatory Visit (HOSPITAL_BASED_OUTPATIENT_CLINIC_OR_DEPARTMENT_OTHER)
Admission: RE | Admit: 2014-12-01 | Discharge: 2014-12-01 | Disposition: A | Discharge status: Home / Self Care | Payer: Medicare Other | Source: Ambulatory Visit | Attending: Cardiovascular Disease | Admitting: Cardiovascular Disease

## 2014-12-01 DIAGNOSIS — Z95828 Presence of other vascular implants and grafts: Secondary | ICD-10-CM | POA: Diagnosis not present

## 2014-12-01 DIAGNOSIS — I739 Peripheral vascular disease, unspecified: Secondary | ICD-10-CM | POA: Insufficient documentation

## 2014-12-01 DIAGNOSIS — Z87891 Personal history of nicotine dependence: Secondary | ICD-10-CM | POA: Diagnosis not present

## 2014-12-01 DIAGNOSIS — I2510 Atherosclerotic heart disease of native coronary artery without angina pectoris: Secondary | ICD-10-CM | POA: Diagnosis not present

## 2014-12-01 DIAGNOSIS — I10 Essential (primary) hypertension: Secondary | ICD-10-CM | POA: Diagnosis not present

## 2014-12-01 DIAGNOSIS — I700 Atherosclerosis of aorta: Secondary | ICD-10-CM | POA: Insufficient documentation

## 2014-12-01 DIAGNOSIS — E785 Hyperlipidemia, unspecified: Secondary | ICD-10-CM | POA: Diagnosis not present

## 2014-12-08 ENCOUNTER — Encounter: Payer: Self-pay | Admitting: Cardiovascular Disease

## 2014-12-08 ENCOUNTER — Ambulatory Visit (INDEPENDENT_AMBULATORY_CARE_PROVIDER_SITE_OTHER): Payer: Medicare Other | Admitting: Cardiovascular Disease

## 2014-12-08 VITALS — BP 126/72 | HR 55 | Ht 66.0 in | Wt 161.2 lb

## 2014-12-08 DIAGNOSIS — I739 Peripheral vascular disease, unspecified: Secondary | ICD-10-CM | POA: Diagnosis not present

## 2014-12-08 NOTE — Patient Instructions (Addendum)
Your physician has recommended you make the following change in your medication:  1.) STOP PLAVIX  Your physician wants you to follow-up in: [place].  You will receive a reminder letter in the mail two months in advance. If you don't receive a letter, please call our office to schedule the follow-up appointment.  Your physician has requested that you have an aorto-iliac duplex. This test is an ultrasound of the arteries. It looks at arterial blood flow. There are no restrictions or special instructions.  PLEASE SCHEDULE DUPLEX STUDY FOR SAME DAY AS FOLLOW UP WITH DR. Fletcher Anon. (PRIOR TO THE APPOINTMENT)

## 2014-12-08 NOTE — Progress Notes (Signed)
Cardiology Office Note   Date:  12/08/2014   ID:  ARTHUR MICKLEY, DOB 09/12/1926, MRN [id number]  PCP:  Irven Shelling, MD  Cardiologist: Dr. Tamala Julian  No chief complaint on file.     History of Present Illness: Danny [name] is a 81 y.o. male who is here today for follow-up visit regarding  peripheral arterial disease. He has known history of coronary artery disease status post CABG more than 10 years ago. He had bilateral carotid endarterectomy, chronic kidney disease, hypertension and hyperlipidemia. He quit smoking more than 20 years ago. He was seen in May for progressive symptoms of bilateral hip and thigh discomfort with walking. Noninvasive vascular evaluation showed an ABI of 0.41 on the right and 0.34 on the left. There was mild nonobstructive SFA disease and three-vessel runoff bilaterally. There were monophasic waveforms throughout bilateral lower extremities suggestive of significant inflow disease. 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 The right common iliac artery was patent but severely stenosed.  He failed medical therapy. I proceeded with CO2 angiography in May which confirmed severe right common iliac artery stenosis and occluded left common iliac artery. I performed successful bilateral kissing stent placement.  He has been doing very well since then with resolution of the majority of his symptoms. He has very mild bilateral calf pain with walking left worse than right. Recent noninvasive evaluation showed an ABI of 0.5 bilaterally with borderline elevated velocity in the distal aorta and bilateral common iliac arteries.  Past Medical History  Diagnosis Date  . Hypertension   . Hyperlipidemia   . ED (erectile dysfunction)   . Coronary artery disease   . Cerebrovascular disease   . Syncope   . Insomnia   . Basal cell carcinoma of nose     "right side; burned it off"  . Myocardial infarction 2002  . History of gout   . Chronic kidney  disease (CKD), stage III (moderate)   . PVD (peripheral vascular disease)     a. s/p right and left common iliac artery angioplasty with bilateral stent placement.  . Carotid artery disease     a. s/p prior carotid endarterectomy.  . Unequal blood pressure in upper extremities     Past Surgical History  Procedure Laterality Date  . Carotid endarterectomy Bilateral   . Peripheral vascular catheterization N/A 08/05/2014    Procedure: Abdominal Aortogram;  Surgeon: Wellington Hampshire, MD;  Location: [facility];  Service: Cardiovascular;  Laterality: N/A;  . Peripheral vascular catheterization Bilateral 08/05/2014    Procedure: Peripheral Vascular Intervention;  Surgeon: Wellington Hampshire, MD;  Location: [facility];  Service: Cardiovascular;  Laterality: Bilateral;  iliacs  . Appendectomy    . Hernia repair    . Abdominal hernia repair  X 4  . Coronary angioplasty with stent placement      "~ 6 months before bypass"  . Coronary artery bypass graft  2002    "CABG X4"     Current Outpatient Prescriptions  Medication Sig Dispense Refill  . amitriptyline (ELAVIL) 10 MG tablet Take 1 tablet by mouth daily.    Marland Kitchen amLODipine (NORVASC) 10 MG tablet Take 10 mg by mouth daily.    Marland Kitchen aspirin EC 81 MG tablet Take 81 mg by mouth daily.    Marland Kitchen atorvastatin (LIPITOR) 40 MG tablet Take 40 mg by mouth daily.    . cholecalciferol (VITAMIN D) 1000 UNITS tablet Take 1,000 Units by mouth daily.    Marland Kitchen  clopidogrel (PLAVIX) 75 MG tablet Take 1 tablet (75 mg total) by mouth daily. 30 tablet 6  . lisinopril (PRINIVIL,ZESTRIL) 20 MG tablet Take 20 mg by mouth daily.    . metoprolol (LOPRESSOR) 50 MG tablet Take 50 mg by mouth 2 (two) times daily.    . nitroGLYCERIN (NITROSTAT) 0.4 MG SL tablet Place 0.4 mg under the tongue every 5 (five) minutes as needed. For cheat pain.     No current facility-administered medications for this visit.    Allergies:   Clindamycin/lincomycin    Social History:  The  patient  reports that he has quit smoking. His smoking use included Cigarettes. He has a 56 pack-year smoking history. He has never used smokeless tobacco. He reports that he does not drink alcohol or use illicit drugs.   Family History:  The patient's family history includes Heart failure in his father and mother.    ROS:  Please see the history of present illness.   Otherwise, review of systems are positive for discoloration and lower extremity. Family also suggested there is diminished cognitive function.   All other systems are reviewed and negative.    PHYSICAL EXAM: VS:  BP 126/72 mmHg  Pulse 55  Ht 5\' 6"  (1.676 m)  Wt 161 lb 3.2 oz (73.12 kg)  BMI 26.03 kg/m2 , BMI Body mass index is 26.03 kg/(m^2). GEN: Well nourished, well developed, in no acute distress HEENT: normal Neck: no JVD, carotid bruits, or masses. Right carotid bruit is heard. Cardiac: RRR; rubs, or gallops,no edema . Respiratory:  clear to auscultation bilaterally, normal work of breathing GI: soft, nontender, nondistended, + BS MS: no deformity or atrophy Skin: warm and dry, no rash Neuro:  Strength and sensation are intact Psych: euthymic mood, full affect Vascular: Femoral pulses are+1  bilaterally. Distal pulses are not palpable. Radial pulses are normal bilaterally.    Recent Labs: 07/28/2014: Hemoglobin 12.8*; Platelets 185.0 09/01/2014: BUN 30*; Creatinine, Ser 1.51*; Potassium 5.0; Sodium 138    Lipid Panel No results found for: CHOL, TRIG, HDL, CHOLHDL, VLDL, LDLCALC, LDLDIRECT    Wt Readings from Last 3 Encounters:  12/08/14 161 lb 3.2 oz (73.12 kg)  09/01/14 156 lb 1.9 oz (70.816 kg)  08/06/14 156 lb 8.4 oz (71 kg)        ASSESSMENT AND PLAN:  1. Peripheral arterial disease: He is doing very well with no recurrent buttock or thigh claudication. I discontinued Plavix a day. Continue lifelong treatment with daily aspirin. Repeat aortoiliac duplex in 6 months. He does seem to have moderate  distal aorta stenosis. His arteries were overall heavily calcified.  2. Atherosclerosis of native coronary artery of native heart with other form of angina pectoris:  He is stable with no significant angina. Continue medical therapy.  3. Bilateral carotid stenosis: Carotid Doppler showed 40-59% bilateral ICA stenosis. No indication for revascularization. Continue treatment of risk factors.   Signed, Kathlyn Sacramento, MD  12/08/2014 10:07 AM

## 2015-05-17 ENCOUNTER — Encounter: Payer: Self-pay | Admitting: Interventional Cardiology

## 2015-05-17 ENCOUNTER — Ambulatory Visit (INDEPENDENT_AMBULATORY_CARE_PROVIDER_SITE_OTHER): Payer: Medicare Other | Admitting: Interventional Cardiology

## 2015-05-17 VITALS — BP 118/58 | HR 64 | Ht 66.0 in | Wt 166.5 lb

## 2015-05-17 DIAGNOSIS — E785 Hyperlipidemia, unspecified: Secondary | ICD-10-CM | POA: Diagnosis not present

## 2015-05-17 DIAGNOSIS — I10 Essential (primary) hypertension: Secondary | ICD-10-CM

## 2015-05-17 DIAGNOSIS — N189 Chronic kidney disease, unspecified: Secondary | ICD-10-CM

## 2015-05-17 DIAGNOSIS — I739 Peripheral vascular disease, unspecified: Secondary | ICD-10-CM | POA: Diagnosis not present

## 2015-05-17 DIAGNOSIS — I25118 Atherosclerotic heart disease of native coronary artery with other forms of angina pectoris: Secondary | ICD-10-CM | POA: Diagnosis not present

## 2015-05-17 DIAGNOSIS — I6523 Occlusion and stenosis of bilateral carotid arteries: Secondary | ICD-10-CM

## 2015-05-17 NOTE — Progress Notes (Signed)
Cardiology Office Note   Date:  05/17/2015   ID:  Danny [name], DOB [birth year], MRN [id number]  PCP:  Irven Shelling, MD  Cardiologist:  Sinclair Grooms, MD   Chief Complaint  Patient presents with  . Coronary Artery Disease  . Claudication      History of Present Illness: Danny [name] is a 82 y.o. male who presents for Coronary artery disease, hypertension, hyperlipidemia, PAD with recent interventional bilateral stent procedure, carotid disease, and chronic kidney disease.  Danny [name] is doing well. He denies angina. Exertional tolerance is improved after his lower extremity interventional procedure which has allowed him to be more active with walking without leg fatigue and pain. He denies orthopnea, PND, fainting, falls, transient neurological symptoms, nitroglycerin use, and excessive daytime sleepiness.  Past Medical History  Diagnosis Date  . Hypertension   . Hyperlipidemia   . ED (erectile dysfunction)   . Coronary artery disease   . Cerebrovascular disease   . Syncope   . Insomnia   . Basal cell carcinoma of nose     "right side; burned it off"  . Myocardial infarction ([facility]) 2002  . History of gout   . Chronic kidney disease (CKD), stage III (moderate)   . PVD (peripheral vascular disease) ([place])     a. s/p right and left common iliac artery angioplasty with bilateral stent placement.  . Carotid artery disease ([facility])     a. s/p prior carotid endarterectomy.  . Unequal blood pressure in upper extremities     Past Surgical History  Procedure Laterality Date  . Carotid endarterectomy Bilateral   . Peripheral vascular catheterization N/A 08/05/2014    Procedure: Abdominal Aortogram;  Surgeon: Wellington Hampshire, MD;  Location: [facility];  Service: Cardiovascular;  Laterality: N/A;  . Peripheral vascular catheterization Bilateral 08/05/2014    Procedure: Peripheral Vascular Intervention;  Surgeon: Wellington Hampshire, MD;  Location: [facility];   Service: Cardiovascular;  Laterality: Bilateral;  iliacs  . Appendectomy    . Hernia repair    . Abdominal hernia repair  X 4  . Coronary angioplasty with stent placement      "~ 6 months before bypass"  . Coronary artery bypass graft  2002    "CABG X4"     Current Outpatient Prescriptions  Medication Sig Dispense Refill  . amLODipine (NORVASC) 10 MG tablet Take 10 mg by mouth daily.    Marland Kitchen aspirin EC 81 MG tablet Take 81 mg by mouth daily.    Marland Kitchen atorvastatin (LIPITOR) 40 MG tablet Take 40 mg by mouth daily.    . cholecalciferol (VITAMIN D) 1000 UNITS tablet Take 1,000 Units by mouth daily.    Marland Kitchen lisinopril (PRINIVIL,ZESTRIL) 20 MG tablet Take 20 mg by mouth daily.    . metoprolol (LOPRESSOR) 50 MG tablet Take 50 mg by mouth 2 (two) times daily.    . nitroGLYCERIN (NITROSTAT) 0.4 MG SL tablet Place 0.4 mg under the tongue every 5 (five) minutes as needed. For cheat pain.     No current facility-administered medications for this visit.    Allergies:   Clindamycin/lincomycin    Social History:  The patient  reports that he has quit smoking. His smoking use included Cigarettes. He has a 56 pack-year smoking history. He has never used smokeless tobacco. He reports that he does not drink alcohol or use illicit drugs.   Family History:  The patient's family history includes Heart failure in his father  and mother.    ROS:  Please see the history of present illness.   Otherwise, review of systems are positive for No complaints.   All other systems are reviewed and negative.    PHYSICAL EXAM: VS:  BP 118/58 mmHg  Pulse 64  Ht 5\' 6"  (1.676 m)  Wt 166 lb 8 oz (75.524 kg)  BMI 26.89 kg/m2 , BMI Body mass index is 26.89 kg/(m^2). GEN: Well nourished, well developed, in no acute distress HEENT: normal Neck: no JVD, carotid bruits, or masses Cardiac: RRR.  There is no murmur, rub, or gallop. There is no edema. Respiratory:  clear to auscultation bilaterally, normal work of breathing. GI:  soft, nontender, nondistended, + BS MS: no deformity or atrophy Skin: warm and dry, no rash Neuro:  Strength and sensation are intact Psych: euthymic mood, full affect   EKG:  EKG is ordered today. The ekg reveals sinus rhythm, left atrial abnormality, biphasic T waves V1 and V2, nonspecific T wave flattening otherwise.   Recent Labs: 07/28/2014: Hemoglobin 12.8*; Platelets 185.0 09/01/2014: BUN 30*; Creatinine, Ser 1.51*; Potassium 5.0; Sodium 138    Lipid Panel No results found for: CHOL, TRIG, HDL, CHOLHDL, VLDL, LDLCALC, LDLDIRECT    Wt Readings from Last 3 Encounters:  05/17/15 166 lb 8 oz (75.524 kg)  12/08/14 161 lb 3.2 oz (73.12 kg)  09/01/14 156 lb 1.9 oz (70.816 kg)      Other studies Reviewed: Additional studies/ records that were reviewed today include: Reviewed electronic health chart records.. The findings include review the peripheral arterial disease notes and procedure documents..    ASSESSMENT AND PLAN:  1. Atherosclerosis of native coronary artery of native heart with other form of angina pectoris ([facility]) Stable without angina  2. Essential hypertension, benign Controlled  3. PAD (peripheral artery disease) ([facility]) Improved bilateral claudication after bilateral iliac stent  4. Chronic kidney disease, unspecified stage Stable  5. Bilateral carotid artery stenosis No neurological complaints  6. Hyperlipidemia Lipids followed by primary care    Current medicines are reviewed at length with the patient today.  The patient has the following concerns regarding medicines: No change in current medical regimen.  The following changes/actions have been instituted:    Increase walking  Call if angina  No change in therapy  Labs/ tests ordered today include:  No orders of the defined types were placed in this encounter.     Disposition:   FU with HS in 1 year  Signed, Sinclair Grooms, MD  05/17/2015 2:48 PM    [address], [place], [place]  [address] Phone: [phone]; Fax: [phone]

## 2015-05-17 NOTE — Patient Instructions (Signed)
Medication Instructions:  Your physician recommends that you continue on your current medications as directed. Please refer to the Current Medication list given to you today.   Labwork: None ordered  Testing/Procedures: None ordered  Follow-Up: Your physician wants you to follow-up in: 1 year with Dr.Smith You will receive a reminder letter in the mail two months in advance. If you don't receive a letter, please call our office to schedule the follow-up appointment.  Follow up with Dr.Arida as planned   Any Other Special Instructions Will Be Listed Below (If Applicable). Your physician discussed the importance of regular exercise and recommended that you start or continue a regular exercise program for good health.      If you need a refill on your cardiac medications before your next appointment, please call your pharmacy.

## 2015-06-03 ENCOUNTER — Other Ambulatory Visit: Payer: Self-pay | Admitting: Cardiovascular Disease

## 2015-06-03 DIAGNOSIS — I739 Peripheral vascular disease, unspecified: Secondary | ICD-10-CM

## 2015-06-15 ENCOUNTER — Ambulatory Visit (INDEPENDENT_AMBULATORY_CARE_PROVIDER_SITE_OTHER): Payer: Medicare Other | Admitting: Cardiovascular Disease

## 2015-06-15 ENCOUNTER — Encounter: Payer: Self-pay | Admitting: Cardiovascular Disease

## 2015-06-15 ENCOUNTER — Ambulatory Visit (HOSPITAL_COMMUNITY)
Admission: RE | Admit: 2015-06-15 | Discharge: 2015-06-15 | Disposition: A | Discharge status: Home / Self Care | Payer: Medicare Other | Source: Ambulatory Visit | Attending: Cardiovascular Disease | Admitting: Cardiovascular Disease

## 2015-06-15 VITALS — BP 140/60 | HR 60 | Ht 66.0 in | Wt 168.2 lb

## 2015-06-15 DIAGNOSIS — I739 Peripheral vascular disease, unspecified: Secondary | ICD-10-CM

## 2015-06-15 DIAGNOSIS — R938 Abnormal findings on diagnostic imaging of other specified body structures: Secondary | ICD-10-CM | POA: Insufficient documentation

## 2015-06-15 DIAGNOSIS — E785 Hyperlipidemia, unspecified: Secondary | ICD-10-CM | POA: Diagnosis not present

## 2015-06-15 DIAGNOSIS — I700 Atherosclerosis of aorta: Secondary | ICD-10-CM | POA: Insufficient documentation

## 2015-06-15 DIAGNOSIS — I708 Atherosclerosis of other arteries: Secondary | ICD-10-CM | POA: Insufficient documentation

## 2015-06-15 DIAGNOSIS — N183 Chronic kidney disease, stage 3 (moderate): Secondary | ICD-10-CM | POA: Diagnosis not present

## 2015-06-15 DIAGNOSIS — I129 Hypertensive chronic kidney disease with stage 1 through stage 4 chronic kidney disease, or unspecified chronic kidney disease: Secondary | ICD-10-CM | POA: Insufficient documentation

## 2015-06-15 NOTE — Progress Notes (Signed)
Cardiology Office Note   Date:  06/16/2015   ID:  Danny [name], DOB 03/13/1927, MRN [id number]  PCP:  Irven Shelling, MD  Cardiologist: Dr. Tamala Julian  No chief complaint on file.     History of Present Illness: Danny [name] is a 82 y.o. male who is here today for follow-up visit regarding  peripheral arterial disease. He has known history of coronary artery disease status post CABG more than 10 years ago. He had bilateral carotid endarterectomy, chronic kidney disease, hypertension and hyperlipidemia. He quit smoking more than 20 years ago. He was seen in May, 2016 for progressive symptoms of bilateral hip and thigh discomfort with walking. Noninvasive vascular evaluation showed an ABI of 0.41 on the right and 0.34 on the left. There was mild nonobstructive SFA disease and three-vessel runoff bilaterally. There were monophasic waveforms throughout bilateral lower extremities suggestive of significant inflow disease. 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 The right common iliac artery was patent but severely stenosed.  CO2 angiography in May, 2016 confirmed severe right common iliac artery stenosis and occluded left common iliac artery. I performed successful bilateral kissing stent placement.  He has been doing very well since then with resolution of the majority of his symptoms. He continues to have mild bilateral calf pain with walking left worse than right. Noninvasive vascular evaluation today showed an ABI of 0.64 on the right and 0.66 on the left which is stable from before. Duplex showed stable significant stenosis in the distal aorta and stable moderately high velocities in the bilateral common iliac arteries  Past Medical History  Diagnosis Date  . Hypertension   . Hyperlipidemia   . ED (erectile dysfunction)   . Coronary artery disease   . Cerebrovascular disease   . Syncope   . Insomnia   . Basal cell carcinoma of nose     "right side; burned  it off"  . Myocardial infarction ([place]) 2002  . History of gout   . Chronic kidney disease (CKD), stage III (moderate)   . PVD (peripheral vascular disease) ([place])     a. s/p right and left common iliac artery angioplasty with bilateral stent placement.  . Carotid artery disease ([place])     a. s/p prior carotid endarterectomy.  . Unequal blood pressure in upper extremities     Past Surgical History  Procedure Laterality Date  . Carotid endarterectomy Bilateral   . Peripheral vascular catheterization N/A 08/05/2014    Procedure: Abdominal Aortogram;  Surgeon: Wellington Hampshire, MD;  Location: [facility];  Service: Cardiovascular;  Laterality: N/A;  . Peripheral vascular catheterization Bilateral 08/05/2014    Procedure: Peripheral Vascular Intervention;  Surgeon: Wellington Hampshire, MD;  Location: [facility];  Service: Cardiovascular;  Laterality: Bilateral;  iliacs  . Appendectomy    . Hernia repair    . Abdominal hernia repair  X 4  . Coronary angioplasty with stent placement      "~ 6 months before bypass"  . Coronary artery bypass graft  2002    "CABG X4"     Current Outpatient Prescriptions  Medication Sig Dispense Refill  . amLODipine (NORVASC) 10 MG tablet Take 10 mg by mouth daily.    Marland Kitchen aspirin EC 81 MG tablet Take 81 mg by mouth daily.    Marland Kitchen atorvastatin (LIPITOR) 40 MG tablet Take 40 mg by mouth daily.    . cholecalciferol (VITAMIN D) 1000 UNITS tablet Take 1,000 Units by  mouth daily.    Marland Kitchen lisinopril (PRINIVIL,ZESTRIL) 20 MG tablet Take 20 mg by mouth daily.    . metoprolol (LOPRESSOR) 50 MG tablet Take 50 mg by mouth 2 (two) times daily.    . nitroGLYCERIN (NITROSTAT) 0.4 MG SL tablet Place 0.4 mg under the tongue every 5 (five) minutes as needed. For cheat pain.     No current facility-administered medications for this visit.    Allergies:   Clindamycin/lincomycin    Social History:  The patient  reports that he has quit smoking. His smoking use included  Cigarettes. He has a 56 pack-year smoking history. He has never used smokeless tobacco. He reports that he does not drink alcohol or use illicit drugs.   Family History:  The patient's family history includes Heart failure in his father and mother. There is no history of Heart attack or Hypertension.    ROS:  Please see the history of present illness.   Otherwise, review of systems are positive for discoloration and lower extremity. Family also suggested there is diminished cognitive function.   All other systems are reviewed and negative.    PHYSICAL EXAM: VS:  BP 140/60 mmHg  Pulse 60  Ht 5\' 6"  (1.676 m)  Wt 168 lb 3.2 oz (76.295 kg)  BMI 27.16 kg/m2 , BMI Body mass index is 27.16 kg/(m^2). GEN: Well nourished, well developed, in no acute distress HEENT: normal Neck: no JVD, carotid bruits, or masses. Right carotid bruit is heard. Cardiac: RRR; rubs, or gallops,no edema . Respiratory:  clear to auscultation bilaterally, normal work of breathing GI: soft, nontender, nondistended, + BS MS: no deformity or atrophy Skin: warm and dry, no rash Neuro:  Strength and sensation are intact Psych: euthymic mood, full affect Vascular: Femoral pulses are+1  bilaterally. Distal pulses are not palpable. Radial pulses are normal bilaterally.    Recent Labs: 07/28/2014: Hemoglobin 12.8*; Platelets 185.0 09/01/2014: BUN 30*; Creatinine, Ser 1.51*; Potassium 5.0; Sodium 138    Lipid Panel No results found for: CHOL, TRIG, HDL, CHOLHDL, VLDL, LDLCALC, LDLDIRECT    Wt Readings from Last 3 Encounters:  06/15/15 168 lb 3.2 oz (76.295 kg)  05/17/15 166 lb 8 oz (75.524 kg)  12/08/14 161 lb 3.2 oz (73.12 kg)        ASSESSMENT AND PLAN:  1. Peripheral arterial disease: He is doing reasonably well with no recurrent buttock or thigh claudication. He continues to have mild bilateral calf claudication. He does have borderline significant disease in the distal aorta with moderate restenosis in the  iliac stents. Continue to monitor clinically.   2. Atherosclerosis of native coronary artery of native heart with other form of angina pectoris:  He is stable with no significant angina. Continue medical therapy.  3. Bilateral carotid stenosis: Carotid Doppler showed 40-59% bilateral ICA stenosis. No indication for revascularization. Continue treatment of risk factors.  Follow-up with me in 6 months.  Signed,  Kathlyn Sacramento, MD  06/16/2015 2:56 PM

## 2015-06-15 NOTE — Patient Instructions (Signed)

## 2015-12-17 ENCOUNTER — Other Ambulatory Visit: Payer: Self-pay | Admitting: Cardiovascular Disease

## 2015-12-17 DIAGNOSIS — I6523 Occlusion and stenosis of bilateral carotid arteries: Secondary | ICD-10-CM

## 2015-12-21 ENCOUNTER — Ambulatory Visit (HOSPITAL_COMMUNITY)
Admission: RE | Admit: 2015-12-21 | Discharge: 2015-12-21 | Disposition: A | Discharge status: Home / Self Care | Payer: Medicare Other | Source: Ambulatory Visit | Attending: Cardiovascular Disease | Admitting: Cardiovascular Disease

## 2015-12-21 ENCOUNTER — Encounter: Payer: Self-pay | Admitting: Cardiovascular Disease

## 2015-12-21 ENCOUNTER — Ambulatory Visit (INDEPENDENT_AMBULATORY_CARE_PROVIDER_SITE_OTHER): Payer: Medicare Other | Admitting: Cardiovascular Disease

## 2015-12-21 VITALS — BP 116/80 | HR 84 | Ht 66.0 in | Wt 163.0 lb

## 2015-12-21 DIAGNOSIS — I6523 Occlusion and stenosis of bilateral carotid arteries: Secondary | ICD-10-CM | POA: Diagnosis not present

## 2015-12-21 DIAGNOSIS — I779 Disorder of arteries and arterioles, unspecified: Secondary | ICD-10-CM

## 2015-12-21 DIAGNOSIS — I10 Essential (primary) hypertension: Secondary | ICD-10-CM

## 2015-12-21 DIAGNOSIS — E7800 Pure hypercholesterolemia, unspecified: Secondary | ICD-10-CM | POA: Diagnosis not present

## 2015-12-21 DIAGNOSIS — I739 Peripheral vascular disease, unspecified: Secondary | ICD-10-CM | POA: Diagnosis not present

## 2015-12-21 NOTE — Patient Instructions (Signed)
Medication Instructions:  Your physician recommends that you continue on your current medications as directed. Please refer to the Current Medication list given to you today.  Labwork: No new orders.   Testing/Procedures: Your physician has requested that you have an aorto-iliac duplex in 6 MONTHS. During this test, an ultrasound is used to evaluate the aorta and iliac arteries. Do not eat after midnight the day before and avoid carbonated beverages  Follow-Up: Your physician wants you to follow-up in: 6 MONTHS with Dr Fletcher Anon. You will receive a reminder letter in the mail two months in advance. If you don't receive a letter, please call our office to schedule the follow-up appointment.   Any Other Special Instructions Will Be Listed Below (If Applicable).     If you need a refill on your cardiac medications before your next appointment, please call your pharmacy.

## 2015-12-21 NOTE — Progress Notes (Signed)
Cardiology Office Note   Date:  12/21/2015   ID:  CRUE OTERO, DOB [birth year], MRN [id number]  PCP:  Irven Shelling, MD  Cardiologist: Dr. Tamala Julian  No chief complaint on file.     History of Present Illness: Danny [name] is a 82 y.o. male who is here today for follow-up visit regarding  peripheral arterial disease. He has known history of coronary artery disease status post CABG more than 10 years ago. He had bilateral carotid endarterectomy, chronic kidney disease, hypertension, bilateral common iliac artery stenting and hyperlipidemia. He quit smoking more than 20 years ago.  He underwent bilateral kissing stent placement to the common iliac arteries in May of 2016 for severe claudication.  He has been doing well overall with stable mild bilateral calf claudication. No chest pain or shortness of breath. His blood pressure in left arm slower than the right arm. However, he has no symptoms of left arm claudication.  Past Medical History:  Diagnosis Date  . Basal cell carcinoma of nose    "right side; burned it off"  . Carotid artery disease ([place])    a. s/p prior carotid endarterectomy.  . Cerebrovascular disease   . Chronic kidney disease (CKD), stage III (moderate)   . Coronary artery disease   . ED (erectile dysfunction)   . History of gout   . Hyperlipidemia   . Hypertension   . Insomnia   . Myocardial infarction 2002  . PVD (peripheral vascular disease) ([place])    a. s/p right and left common iliac artery angioplasty with bilateral stent placement.  . Syncope   . Unequal blood pressure in upper extremities     Past Surgical History:  Procedure Laterality Date  . ABDOMINAL HERNIA REPAIR  X 4  . APPENDECTOMY    . CAROTID ENDARTERECTOMY Bilateral   . CORONARY ANGIOPLASTY WITH STENT PLACEMENT     "~ 6 months before bypass"  . CORONARY ARTERY BYPASS GRAFT  2002   "CABG X4"  . HERNIA REPAIR    . PERIPHERAL VASCULAR CATHETERIZATION N/A 08/05/2014   Procedure: Abdominal Aortogram;  Surgeon: Wellington Hampshire, MD;  Location: [facility];  Service: Cardiovascular;  Laterality: N/A;  . PERIPHERAL VASCULAR CATHETERIZATION Bilateral 08/05/2014   Procedure: Peripheral Vascular Intervention;  Surgeon: Wellington Hampshire, MD;  Location: [facility];  Service: Cardiovascular;  Laterality: Bilateral;  iliacs     Current Outpatient Prescriptions  Medication Sig Dispense Refill  . amLODipine (NORVASC) 10 MG tablet Take 10 mg by mouth daily.    Marland Kitchen aspirin EC 81 MG tablet Take 81 mg by mouth daily.    Marland Kitchen atorvastatin (LIPITOR) 40 MG tablet Take 40 mg by mouth daily.    . cholecalciferol (VITAMIN D) 1000 UNITS tablet Take 1,000 Units by mouth daily.    Marland Kitchen lisinopril (PRINIVIL,ZESTRIL) 20 MG tablet Take 20 mg by mouth daily.    . metoprolol (LOPRESSOR) 50 MG tablet Take 50 mg by mouth 2 (two) times daily.    . nitroGLYCERIN (NITROSTAT) 0.4 MG SL tablet Place 0.4 mg under the tongue every 5 (five) minutes as needed for chest pain.      No current facility-administered medications for this visit.     Allergies:   Clindamycin/lincomycin    Social History:  The patient  reports that he has quit smoking. His smoking use included Cigarettes. He has a 56.00 pack-year smoking history. He has never used smokeless tobacco. He reports that he does not  drink alcohol or use drugs.   Family History:  The patient's family history includes Heart failure in his father and mother.    ROS:  Please see the history of present illness.   Otherwise, review of systems are positive for discoloration and lower extremity. Family also suggested there is diminished cognitive function.   All other systems are reviewed and negative.    PHYSICAL EXAM: VS:  BP 116/80 Comment: Right arm is 140 64  Pulse 84   Ht 5\' 6"  (1.676 m)   Wt 163 lb (73.9 kg)   BMI 26.31 kg/m  , BMI Body mass index is 26.31 kg/m. GEN: Well nourished, well developed, in no acute distress    HEENT: normal  Neck: no JVD, carotid bruits, or masses. Right carotid bruit is heard. Cardiac: RRR; rubs, or gallops,no edema . Respiratory:  clear to auscultation bilaterally, normal work of breathing GI: soft, nontender, nondistended, + BS MS: no deformity or atrophy  Skin: warm and dry, no rash Neuro:  Strength and sensation are intact Psych: euthymic mood, full affect Vascular: Femoral pulses are+1  bilaterally. Distal pulses are not palpable. Radial pulses are normal bilaterally.    Recent Labs: No results found for requested labs within last 8760 hours.    Lipid Panel No results found for: CHOL, TRIG, HDL, CHOLHDL, VLDL, LDLCALC, LDLDIRECT    Wt Readings from Last 3 Encounters:  12/21/15 163 lb (73.9 kg)  06/15/15 168 lb 3.2 oz (76.3 kg)  05/17/15 166 lb 8 oz (75.5 kg)        ASSESSMENT AND PLAN:  1. Peripheral arterial disease: He has mild bilateral calf claudication which is not always consistent with the level of physical activities. Most of the time he is actually asymptomatic. Continue medical therapy. Repeat aortoiliac duplex in 6 months given that he is on to have  borderline significant disease in the distal aorta with moderate restenosis in the iliac stents.   2. Atherosclerosis of native coronary artery of native heart with other form of angina pectoris:  He is stable with no significant angina. Continue medical therapy.  3. Bilateral carotid stenosis: Carotid Doppler today  showed 40-59% bilateral ICA stenosis.  Repeat study in one year  4. Essential hypertension: Blood pressure is controlled on current medications.  5. Possible left subclavian artery stenosis: Based on discrepancy in blood pressure. The patient has no symptoms of left arm claudication. Continue to monitor.   Follow-up with me in 6 months.  Signed,  Kathlyn Sacramento, MD  12/21/2015 10:36 AM

## 2016-05-25 ENCOUNTER — Encounter: Payer: Self-pay | Admitting: Interventional Cardiology

## 2016-06-04 DIAGNOSIS — Z9889 Other specified postprocedural states: Secondary | ICD-10-CM | POA: Insufficient documentation

## 2016-06-04 NOTE — Progress Notes (Signed)
Cardiology Office Note    Date:  06/05/2016   ID:  Danny [name], DOB Feb 22, 1927, MRN [id number]  PCP:  Irven Shelling, MD  Cardiologist: Sinclair Grooms, MD   Chief Complaint  Patient presents with  . Coronary Artery Disease    History of Present Illness:  Danny [name] is a 83 y.o. male who presents for coronary artery bypass (LIMA to LAD, SVG to ramus, SVG to obtuse marginal, and SVG to PDA. 2002), hypertension, hyperlipidemia, PAD with recent interventional bilateral stent procedure, carotid disease (bilat CEA), and chronic kidney disease.  Complains of two-pillow orthopnea. Unable to lie flat. No chest discomfort. Unequal blood pressures and arms. Left arm blood pressure lower than right. He denies angina. No episodes of syncope. Claudication is improved. Appetite is stable. No neurological complaints. No left arm fatigue with activity.  Past Medical History:  Diagnosis Date  . Basal cell carcinoma of nose    "right side; burned it off"  . Carotid artery disease ([place])    a. s/p prior carotid endarterectomy.  . Cerebrovascular disease   . Chronic kidney disease (CKD), stage III (moderate)   . Coronary artery disease   . ED (erectile dysfunction)   . History of gout   . Hyperlipidemia   . Hypertension   . Insomnia   . Myocardial infarction 2002  . PVD (peripheral vascular disease) ([place])    a. s/p right and left common iliac artery angioplasty with bilateral stent placement.  . Syncope   . Unequal blood pressure in upper extremities     Past Surgical History:  Procedure Laterality Date  . ABDOMINAL HERNIA REPAIR  X 4  . APPENDECTOMY    . CAROTID ENDARTERECTOMY Bilateral   . CORONARY ANGIOPLASTY WITH STENT PLACEMENT     "~ 6 months before bypass"  . CORONARY ARTERY BYPASS GRAFT  2002   "CABG X4"  . HERNIA REPAIR    . PERIPHERAL VASCULAR CATHETERIZATION N/A 08/05/2014   Procedure: Abdominal Aortogram;  Surgeon: Wellington Hampshire, MD;  Location: [facility];  Service: Cardiovascular;  Laterality: N/A;  . PERIPHERAL VASCULAR CATHETERIZATION Bilateral 08/05/2014   Procedure: Peripheral Vascular Intervention;  Surgeon: Wellington Hampshire, MD;  Location: [facility];  Service: Cardiovascular;  Laterality: Bilateral;  iliacs    Current Medications: Outpatient Medications Prior to Visit  Medication Sig Dispense Refill  . amLODipine (NORVASC) 10 MG tablet Take 10 mg by mouth daily.    Marland Kitchen aspirin EC 81 MG tablet Take 81 mg by mouth daily.    Marland Kitchen atorvastatin (LIPITOR) 40 MG tablet Take 40 mg by mouth daily.    . cholecalciferol (VITAMIN D) 1000 UNITS tablet Take 1,000 Units by mouth daily.    . nitroGLYCERIN (NITROSTAT) 0.4 MG SL tablet Place 0.4 mg under the tongue every 5 (five) minutes as needed for chest pain.     Marland Kitchen lisinopril (PRINIVIL,ZESTRIL) 20 MG tablet Take 20 mg by mouth daily.    . metoprolol (LOPRESSOR) 50 MG tablet Take 50 mg by mouth 2 (two) times daily.     No facility-administered medications prior to visit.      Allergies:   Clindamycin/lincomycin   Social History   Social History  . Marital status: Widowed    Spouse name: N/A  . Number of children: N/A  . Years of education: N/A   Social History Main Topics  . Smoking status: Former Smoker    Packs/day: 1.00    Years:  56.00    Types: Cigarettes  . Smokeless tobacco: Never Used     Comment: "stopped smoking when I had MI in 2002"  . Alcohol use No  . Drug use: No  . Sexual activity: Not Asked   Other Topics Concern  . None   Social History Narrative  . None     Family History:  The patient's family history includes Heart failure in his father and mother.   ROS:   Please see the history of present illness.    Lower blood pressure in left arm than right. No symptoms. Ankle swelling right greater than left. Two-pillow orthopnea. No chest pain, syncope, low back stiffness, joint swelling, and difficulty with balance.  All other systems reviewed and are  negative.   PHYSICAL EXAM:   VS:  BP (!) 144/62 (BP Location: Left Arm)   Pulse (!) 50   Ht '5\' 6"'$  (1.676 m)   Wt 172 lb (78 kg)   BMI 27.76 kg/m    GEN: Well nourished, well developed, in no acute distress  HEENT: normal  Neck: no JVD, carotid bruits, or masses Cardiac: RRR; no murmurs, rubs, or gallops,no edema . Decreased pulse in left wrist. Respiratory:  clear to auscultation bilaterally, normal work of breathing GI: soft, nontender, nondistended, + BS MS: no deformity or atrophy  Skin: warm and dry, no rash Neuro:  Alert and Oriented x 3, Strength and sensation are intact Psych: euthymic mood, full affect  Wt Readings from Last 3 Encounters:  06/05/16 172 lb (78 kg)  12/21/15 163 lb (73.9 kg)  06/15/15 168 lb 3.2 oz (76.3 kg)      Studies/Labs Reviewed:   EKG:  EKG  Sinus bradycardia, normal PR, nonspecific T wave flattening, prominent voltage, and when compared to prior tracing of March 2017, rate is slower.  Recent Labs: No results found for requested labs within last 8760 hours.   Lipid Panel No results found for: CHOL, TRIG, HDL, CHOLHDL, VLDL, LDLCALC, LDLDIRECT  Additional studies/ records that were reviewed today include:  Bilateral carotid Doppler 12/21/15: Heterogeneous plaque, bilaterally. Stable 40-59% bilateral ICA stenosis. Normal subclavian arteries, bilaterally. Patent right vertebral artery with antegrade flow. Progression of disease in the left vertebral artery now shows retrograde flow.     ASSESSMENT:    1. Coronary artery disease involving coronary bypass graft of native heart with angina pectoris ([place])   2. Essential hypertension, benign   3. PAD (peripheral artery disease) ([place])   4. Sinus bradycardia   5. CKD (chronic kidney disease) stage 4, GFR 15-29 ml/min ([facility])   6. Other hyperlipidemia   7. History of bilateral carotid endarterectomy   8. Stenosis of left subclavian artery ([facility])      PLAN:  In order of problems listed  above:  1. Stable without angina pectoris. 2. Significant elevation in right arm measuring 160/55 mmHg. Left arm 98/60 mmHg. This implies left subclavian stenosis. He is asymptomatic. 3. Severe distal aortic and bilateral iliac disease, with improved claudication after PV intervention by Dr. Fletcher Anon. He does not the lower extremity claudication has gotten slightly worse over the last 6 months. 4. Heart rate is 50 bpm on moderate dose metoprolol. Given age, we need to decrease metoprolol to 25 mg twice daily and eventually consider discontinuation to avoid symptomatic bradycardia. With this decrease, I am concerned that his systemic pressure will increase. We will slightly modify his regimen by adding HCTZ 12.5 mg per day. Basic metabolic panel will be done in  7 days to exclude significant worsening in kidney function. Blood pressure clinic in one month. 5. Not addressed 6. Asymptomatic with reference to neurological complaints. 7. Bilateral carotid endarterectomy. Please see the most recent carotid study noted above. 8. Marked disparity in arm systolic blood pressure recordings, nearly 70 mmHg less in left arm than right. In absence of symptoms, my inclination is to do no further evaluation unless he develops angina suggesting subclavian steal and angina due to inadequate LIMA flow..  Blood pressure clinic 1 month. Be met one week. Monitor for anginal complaints. If he develops angina we need to evaluate left subclavian for evidence of steal. Follow-up with Dr. Fletcher Anon within the next 4-6 weeks. Clinical follow-up with me in one year or earlier depending upon clinical status.  Medication Adjustments/Labs and Tests Ordered: Current medicines are reviewed at length with the patient today.  Concerns regarding medicines are outlined above.  Medication changes, Labs and Tests ordered today are listed in the Patient Instructions below. Patient Instructions  Medication Instructions:  1) DISCONTINUE  Lisinopril 2) START Lisinopril/HCTZ 20/12.'5mg'$  once daily. 3) DECREASE Metoprolol Tartrate to '25mg'$  twice daily  Labwork: Your physician recommends that you return for lab work in: 1 week (BMET)   Testing/Procedures: None  Follow-Up: Your physician recommends that you schedule a follow-up appointment in: 1 month with our Hypertension Clinic.  Your physician wants you to follow-up in: 1 year with Dr. Tamala Julian,  You will receive a reminder letter in the mail two months in advance. If you don't receive a letter, please call our office to schedule the follow-up appointment.   Any Other Special Instructions Will Be Listed Below (If Applicable).   Low-Sodium Eating Plan Sodium, which is an element that makes up salt, helps you maintain a healthy balance of fluids in your body. Too much sodium can increase your blood pressure and cause fluid and waste to be held in your body. Your health care provider or dietitian may recommend following this plan if you have high blood pressure (hypertension), kidney disease, liver disease, or heart failure. Eating less sodium can help lower your blood pressure, reduce swelling, and protect your heart, liver, and kidneys. What are tips for following this plan? General guidelines   Most people on this plan should limit their sodium intake to 1,500-2,000 mg (milligrams) of sodium each day. Reading food labels   The Nutrition Facts label lists the amount of sodium in one serving of the food. If you eat more than one serving, you must multiply the listed amount of sodium by the number of servings.  Choose foods with less than 140 mg of sodium per serving.  Avoid foods with 300 mg of sodium or more per serving. Shopping   Look for lower-sodium products, often labeled as "low-sodium" or "no salt added."  Always check the sodium content even if foods are labeled as "unsalted" or "no salt added".  Buy fresh foods.  Avoid canned foods and premade or frozen  meals.  Avoid canned, cured, or processed meats  Buy breads that have less than 80 mg of sodium per slice. Cooking   Eat more home-cooked food and less restaurant, buffet, and fast food.  Avoid adding salt when cooking. Use salt-free seasonings or herbs instead of table salt or sea salt. Check with your health care provider or pharmacist before using salt substitutes.  Cook with plant-based oils, such as canola, sunflower, or olive oil. Meal planning   When eating at a restaurant, ask that your food  be prepared with less salt or no salt, if possible.  Avoid foods that contain MSG (monosodium glutamate). MSG is sometimes added to [place] food, bouillon, and some canned foods. What foods are recommended? The items listed may not be a complete list. Talk with your dietitian about what dietary choices are best for you. Grains  Low-sodium cereals, including oats, puffed wheat and rice, and shredded wheat. Low-sodium crackers. Unsalted rice. Unsalted pasta. Low-sodium bread. Whole-grain breads and whole-grain pasta. Vegetables  Fresh or frozen vegetables. "No salt added" canned vegetables. "No salt added" tomato sauce and paste. Low-sodium or reduced-sodium tomato and vegetable juice. Fruits  Fresh, frozen, or canned fruit. Fruit juice. Meats and other protein foods  Fresh or frozen (no salt added) meat, poultry, seafood, and fish. Low-sodium canned tuna and salmon. Unsalted nuts. Dried peas, beans, and lentils without added salt. Unsalted canned beans. Eggs. Unsalted nut butters. Dairy  Milk. Soy milk. Cheese that is naturally low in sodium, such as ricotta cheese, fresh mozzarella, or Swiss cheese Low-sodium or reduced-sodium cheese. Cream cheese. Yogurt. Fats and oils  Unsalted butter. Unsalted margarine with no trans fat. Vegetable oils such as canola or olive oils. Seasonings and other foods  Fresh and dried herbs and spices. Salt-free seasonings. Low-sodium mustard and ketchup.  Sodium-free salad dressing. Sodium-free light mayonnaise. Fresh or refrigerated horseradish. Lemon juice. Vinegar. Homemade, reduced-sodium, or low-sodium soups. Unsalted popcorn and pretzels. Low-salt or salt-free chips. What foods are not recommended? The items listed may not be a complete list. Talk with your dietitian about what dietary choices are best for you. Grains  Instant hot cereals. Bread stuffing, pancake, and biscuit mixes. Croutons. Seasoned rice or pasta mixes. Noodle soup cups. Boxed or frozen macaroni and cheese. Regular salted crackers. Self-rising flour. Vegetables  Sauerkraut, pickled vegetables, and relishes. Olives. [place] fries. Onion rings. Regular canned vegetables (not low-sodium or reduced-sodium). Regular canned tomato sauce and paste (not low-sodium or reduced-sodium). Regular tomato and vegetable juice (not low-sodium or reduced-sodium). Frozen vegetables in sauces. Meats and other protein foods  Meat or fish that is salted, canned, smoked, spiced, or pickled. Bacon, ham, sausage, hotdogs, corned beef, chipped beef, packaged lunch meats, salt pork, jerky, pickled herring, anchovies, regular canned tuna, sardines, salted nuts. Dairy  Processed cheese and cheese spreads. Cheese curds. Blue cheese. Feta cheese. String cheese. Regular cottage cheese. Buttermilk. Canned milk. Fats and oils  Salted butter. Regular margarine. Ghee. Bacon fat. Seasonings and other foods  Onion salt, garlic salt, seasoned salt, table salt, and sea salt. Canned and packaged gravies. Worcestershire sauce. Tartar sauce. Barbecue sauce. Teriyaki sauce. Soy sauce, including reduced-sodium. Steak sauce. Fish sauce. Oyster sauce. Cocktail sauce. Horseradish that you find on the shelf. Regular ketchup and mustard. Meat flavorings and tenderizers. Bouillon cubes. Hot sauce and Tabasco sauce. Premade or packaged marinades. Premade or packaged taco seasonings. Relishes. Regular salad dressings. Salsa. Potato  and tortilla chips. Corn chips and puffs. Salted popcorn and pretzels. Canned or dried soups. Pizza. Frozen entrees and pot pies. Summary  Eating less sodium can help lower your blood pressure, reduce swelling, and protect your heart, liver, and kidneys.  Most people on this plan should limit their sodium intake to 1,500-2,000 mg (milligrams) of sodium each day.  Canned, boxed, and frozen foods are high in sodium. Restaurant foods, fast foods, and pizza are also very high in sodium. You also get sodium by adding salt to food.  Try to cook at home, eat more fresh fruits and vegetables, and eat  less fast food, canned, processed, or prepared foods. This information is not intended to replace advice given to you by your health care provider. Make sure you discuss any questions you have with your health care provider. Document Released: 08/19/2001 Document Revised: 02/21/2016 Document Reviewed: 02/21/2016 Elsevier Interactive Patient Education  2017 [employer].    If you need a refill on your cardiac medications before your next appointment, please call your pharmacy.      Signed, Sinclair Grooms, MD  06/05/2016 12:40 PM    [address], [place], [place]  [address] Phone: [phone]; Fax: [phone]

## 2016-06-05 ENCOUNTER — Encounter (INDEPENDENT_AMBULATORY_CARE_PROVIDER_SITE_OTHER): Payer: Self-pay

## 2016-06-05 ENCOUNTER — Ambulatory Visit (INDEPENDENT_AMBULATORY_CARE_PROVIDER_SITE_OTHER): Payer: Medicare Other | Admitting: Interventional Cardiology

## 2016-06-05 ENCOUNTER — Encounter: Payer: Self-pay | Admitting: Interventional Cardiology

## 2016-06-05 VITALS — BP 144/62 | HR 50 | Ht 66.0 in | Wt 172.0 lb

## 2016-06-05 DIAGNOSIS — E784 Other hyperlipidemia: Secondary | ICD-10-CM

## 2016-06-05 DIAGNOSIS — I25709 Atherosclerosis of coronary artery bypass graft(s), unspecified, with unspecified angina pectoris: Secondary | ICD-10-CM | POA: Diagnosis not present

## 2016-06-05 DIAGNOSIS — I739 Peripheral vascular disease, unspecified: Secondary | ICD-10-CM | POA: Diagnosis not present

## 2016-06-05 DIAGNOSIS — I10 Essential (primary) hypertension: Secondary | ICD-10-CM | POA: Diagnosis not present

## 2016-06-05 DIAGNOSIS — Z9889 Other specified postprocedural states: Secondary | ICD-10-CM

## 2016-06-05 DIAGNOSIS — R001 Bradycardia, unspecified: Secondary | ICD-10-CM | POA: Diagnosis not present

## 2016-06-05 DIAGNOSIS — I771 Stricture of artery: Secondary | ICD-10-CM

## 2016-06-05 DIAGNOSIS — E7849 Other hyperlipidemia: Secondary | ICD-10-CM

## 2016-06-05 DIAGNOSIS — N184 Chronic kidney disease, stage 4 (severe): Secondary | ICD-10-CM

## 2016-06-05 MED ORDER — HYDROCHLOROTHIAZIDE 12.5 MG PO CAPS: 12.5000 mg | ORAL_CAPSULE | Freq: Every day | ORAL | 3 refills | Status: DC

## 2016-06-05 MED ORDER — METOPROLOL TARTRATE 25 MG PO TABS: 25.0000 mg | ORAL_TABLET | Freq: Two times a day (BID) | ORAL | 3 refills | Status: DC

## 2016-06-05 MED ORDER — LISINOPRIL-HYDROCHLOROTHIAZIDE 20-12.5 MG PO TABS: 1.0000 | ORAL_TABLET | Freq: Every day | ORAL | 3 refills | Status: DC

## 2016-06-05 MED ORDER — LISINOPRIL 20 MG PO TABS: 20.0000 mg | ORAL_TABLET | Freq: Every day | ORAL | 3 refills | Status: DC

## 2016-06-05 NOTE — Patient Instructions (Addendum)
Medication Instructions:  1) START Hydrochlorothiazide 12.5mg  once daily. 2) DECREASE Metoprolol Tartrate to 25mg  twice daily  Labwork: Your physician recommends that you return for lab work in: 1 week (BMET)   Testing/Procedures: None  Follow-Up: Your physician recommends that you schedule a follow-up appointment in: 1 month with our Hypertension Clinic.  Your physician wants you to follow-up in: 1 year with Dr. [name],  You will receive a reminder letter in the mail two months in advance. If you don't receive a letter, please call our office to schedule the follow-up appointment.   Any Other Special Instructions Will Be Listed Below (If Applicable).   Low-Sodium Eating Plan Sodium, which is an element that makes up salt, helps you maintain a healthy balance of fluids in your body. Too much sodium can increase your blood pressure and cause fluid and waste to be held in your body. Your health care provider or dietitian may recommend following this plan if you have high blood pressure (hypertension), kidney disease, liver disease, or heart failure. Eating less sodium can help lower your blood pressure, reduce swelling, and protect your heart, liver, and kidneys. What are tips for following this plan? General guidelines   Most people on this plan should limit their sodium intake to 1,500-2,000 mg (milligrams) of sodium each day. Reading food labels   The Nutrition Facts label lists the amount of sodium in one serving of the food. If you eat more than one serving, you must multiply the listed amount of sodium by the number of servings.  Choose foods with less than 140 mg of sodium per serving.  Avoid foods with 300 mg of sodium or more per serving. Shopping   Look for lower-sodium products, often labeled as "low-sodium" or "no salt added."  Always check the sodium content even if foods are labeled as "unsalted" or "no salt added".  Buy fresh foods.  Avoid canned foods and  premade or frozen meals.  Avoid canned, cured, or processed meats  Buy breads that have less than 80 mg of sodium per slice. Cooking   Eat more home-cooked food and less restaurant, buffet, and fast food.  Avoid adding salt when cooking. Use salt-free seasonings or herbs instead of table salt or sea salt. Check with your health care provider or pharmacist before using salt substitutes.  Cook with plant-based oils, such as canola, sunflower, or olive oil. Meal planning   When eating at a restaurant, ask that your food be prepared with less salt or no salt, if possible.  Avoid foods that contain MSG (monosodium glutamate). MSG is sometimes added to [place] food, bouillon, and some canned foods. What foods are recommended? The items listed may not be a complete list. Talk with your dietitian about what dietary choices are best for you. Grains  Low-sodium cereals, including oats, puffed wheat and rice, and shredded wheat. Low-sodium crackers. Unsalted rice. Unsalted pasta. Low-sodium bread. Whole-grain breads and whole-grain pasta. Vegetables  Fresh or frozen vegetables. "No salt added" canned vegetables. "No salt added" tomato sauce and paste. Low-sodium or reduced-sodium tomato and vegetable juice. Fruits  Fresh, frozen, or canned fruit. Fruit juice. Meats and other protein foods  Fresh or frozen (no salt added) meat, poultry, seafood, and fish. Low-sodium canned tuna and salmon. Unsalted nuts. Dried peas, beans, and lentils without added salt. Unsalted canned beans. Eggs. Unsalted nut butters. Dairy  Milk. Soy milk. Cheese that is naturally low in sodium, such as ricotta cheese, fresh mozzarella, or Swiss cheese Low-sodium or  reduced-sodium cheese. Cream cheese. Yogurt. Fats and oils  Unsalted butter. Unsalted margarine with no trans fat. Vegetable oils such as canola or olive oils. Seasonings and other foods  Fresh and dried herbs and spices. Salt-free seasonings. Low-sodium mustard  and ketchup. Sodium-free salad dressing. Sodium-free light mayonnaise. Fresh or refrigerated horseradish. Lemon juice. Vinegar. Homemade, reduced-sodium, or low-sodium soups. Unsalted popcorn and pretzels. Low-salt or salt-free chips. What foods are not recommended? The items listed may not be a complete list. Talk with your dietitian about what dietary choices are best for you. Grains  Instant hot cereals. Bread stuffing, pancake, and biscuit mixes. Croutons. Seasoned rice or pasta mixes. Noodle soup cups. Boxed or frozen macaroni and cheese. Regular salted crackers. Self-rising flour. Vegetables  Sauerkraut, pickled vegetables, and relishes. Olives. [place] fries. Onion rings. Regular canned vegetables (not low-sodium or reduced-sodium). Regular canned tomato sauce and paste (not low-sodium or reduced-sodium). Regular tomato and vegetable juice (not low-sodium or reduced-sodium). Frozen vegetables in sauces. Meats and other protein foods  Meat or fish that is salted, canned, smoked, spiced, or pickled. Bacon, ham, sausage, hotdogs, corned beef, chipped beef, packaged lunch meats, salt pork, jerky, pickled herring, anchovies, regular canned tuna, sardines, salted nuts. Dairy  Processed cheese and cheese spreads. Cheese curds. Blue cheese. Feta cheese. String cheese. Regular cottage cheese. Buttermilk. Canned milk. Fats and oils  Salted butter. Regular margarine. Ghee. Bacon fat. Seasonings and other foods  Onion salt, garlic salt, seasoned salt, table salt, and sea salt. Canned and packaged gravies. Worcestershire sauce. Tartar sauce. Barbecue sauce. Teriyaki sauce. Soy sauce, including reduced-sodium. Steak sauce. Fish sauce. Oyster sauce. Cocktail sauce. Horseradish that you find on the shelf. Regular ketchup and mustard. Meat flavorings and tenderizers. Bouillon cubes. Hot sauce and Tabasco sauce. Premade or packaged marinades. Premade or packaged taco seasonings. Relishes. Regular salad dressings.  Salsa. Potato and tortilla chips. Corn chips and puffs. Salted popcorn and pretzels. Canned or dried soups. Pizza. Frozen entrees and pot pies. Summary  Eating less sodium can help lower your blood pressure, reduce swelling, and protect your heart, liver, and kidneys.  Most people on this plan should limit their sodium intake to 1,500-2,000 mg (milligrams) of sodium each day.  Canned, boxed, and frozen foods are high in sodium. Restaurant foods, fast foods, and pizza are also very high in sodium. You also get sodium by adding salt to food.  Try to cook at home, eat more fresh fruits and vegetables, and eat less fast food, canned, processed, or prepared foods. This information is not intended to replace advice given to you by your health care provider. Make sure you discuss any questions you have with your health care provider. Document Released: 08/19/2001 Document Revised: 02/21/2016 Document Reviewed: 02/21/2016 Elsevier Interactive Patient Education  2017 [employer].    If you need a refill on your cardiac medications before your next appointment, please call your pharmacy.

## 2016-06-12 ENCOUNTER — Other Ambulatory Visit: Payer: Medicare Other | Admitting: *Deleted

## 2016-06-12 DIAGNOSIS — I10 Essential (primary) hypertension: Secondary | ICD-10-CM

## 2016-06-12 DIAGNOSIS — G903 Multi-system degeneration of the autonomic nervous system: Secondary | ICD-10-CM

## 2016-06-12 LAB — BASIC METABOLIC PANEL
BUN/Creatinine Ratio: 14 (ref 10–24)
BUN: 22 mg/dL (ref 8–27)
CALCIUM: 8.8 mg/dL (ref 8.6–10.2)
CHLORIDE: 102 mmol/L (ref 96–106)
CO2: 22 mmol/L (ref 18–29)
CREATININE: 1.59 mg/dL — AB (ref 0.76–1.27)
GFR calc Af Amer: 44 mL/min/{1.73_m2} — ABNORMAL LOW (ref >59)
GFR calc non Af Amer: 38 mL/min/{1.73_m2} — ABNORMAL LOW (ref >59)
GLUCOSE: 87 mg/dL (ref 65–99)
Potassium: 4.2 mmol/L (ref 3.5–5.2)
Sodium: 142 mmol/L (ref 134–144)

## 2016-06-12 NOTE — Addendum Note (Signed)
Addended by: Eulis Foster on: 06/12/2016 10:15 AM   Modules accepted: Orders

## 2016-06-15 ENCOUNTER — Telehealth: Payer: Self-pay | Admitting: Interventional Cardiology

## 2016-06-15 NOTE — Telephone Encounter (Signed)
New message   Pt daughter is asking to be called about her father. She said his condition has changed and she would like to speak to RN.

## 2016-06-15 NOTE — Telephone Encounter (Signed)
Agree with plan 

## 2016-06-15 NOTE — Telephone Encounter (Signed)
Spoke with daughter and made her aware that Dr. Tamala Julian agreed with plan as outlined.  Daughter verbalized understanding and will call next week with update.

## 2016-06-15 NOTE — Telephone Encounter (Signed)
DPR on file for daughter.  Daughter states pt's weight has went up to 176lbs this morning.  Pt seen in our office 3/26 and weighed 172lbs.  BP in right arm was 174/65, 67.  Yesterday was 160's/?? (daughter couldn't remember).  Denies changes in pt's diet.  Daughter states pt has been more active, went to lunch with nephew, has ben working in the yard.  Pt still has some swelling in abdomen and ankles, denies SOB.  Pt seeing Dr. Fletcher Anon and having ABI's on 06/27/16.  Advised daughter to monitor BP QD about 2 hrs after meds and monitor weights daily at the same time with the same amount of clothing.  Advised I will send message to Dr. Tamala Julian for review and further advisement.

## 2016-06-20 ENCOUNTER — Other Ambulatory Visit: Payer: Self-pay | Admitting: Cardiovascular Disease

## 2016-06-20 DIAGNOSIS — I739 Peripheral vascular disease, unspecified: Secondary | ICD-10-CM

## 2016-06-27 ENCOUNTER — Encounter: Payer: Self-pay | Admitting: Cardiovascular Disease

## 2016-06-27 ENCOUNTER — Inpatient Hospital Stay (HOSPITAL_COMMUNITY): Admission: RE | Admit: 2016-06-27 | Payer: Medicare Other | Source: Ambulatory Visit

## 2016-06-27 ENCOUNTER — Ambulatory Visit (INDEPENDENT_AMBULATORY_CARE_PROVIDER_SITE_OTHER): Payer: Medicare Other | Admitting: Cardiovascular Disease

## 2016-06-27 ENCOUNTER — Ambulatory Visit (HOSPITAL_COMMUNITY)
Admission: RE | Admit: 2016-06-27 | Discharge: 2016-06-27 | Disposition: A | Discharge status: Home / Self Care | Payer: Medicare Other | Source: Ambulatory Visit | Attending: Cardiology | Admitting: Cardiology

## 2016-06-27 VITALS — BP 148/56 | HR 62 | Ht 66.0 in | Wt 166.0 lb

## 2016-06-27 DIAGNOSIS — Z87891 Personal history of nicotine dependence: Secondary | ICD-10-CM | POA: Insufficient documentation

## 2016-06-27 DIAGNOSIS — I6523 Occlusion and stenosis of bilateral carotid arteries: Secondary | ICD-10-CM

## 2016-06-27 DIAGNOSIS — I739 Peripheral vascular disease, unspecified: Secondary | ICD-10-CM

## 2016-06-27 DIAGNOSIS — R938 Abnormal findings on diagnostic imaging of other specified body structures: Secondary | ICD-10-CM | POA: Diagnosis not present

## 2016-06-27 DIAGNOSIS — Z951 Presence of aortocoronary bypass graft: Secondary | ICD-10-CM | POA: Diagnosis not present

## 2016-06-27 DIAGNOSIS — I10 Essential (primary) hypertension: Secondary | ICD-10-CM

## 2016-06-27 DIAGNOSIS — Z95828 Presence of other vascular implants and grafts: Secondary | ICD-10-CM | POA: Insufficient documentation

## 2016-06-27 DIAGNOSIS — I2510 Atherosclerotic heart disease of native coronary artery without angina pectoris: Secondary | ICD-10-CM | POA: Insufficient documentation

## 2016-06-27 DIAGNOSIS — I25118 Atherosclerotic heart disease of native coronary artery with other forms of angina pectoris: Secondary | ICD-10-CM

## 2016-06-27 DIAGNOSIS — I708 Atherosclerosis of other arteries: Secondary | ICD-10-CM | POA: Insufficient documentation

## 2016-06-27 DIAGNOSIS — I700 Atherosclerosis of aorta: Secondary | ICD-10-CM | POA: Diagnosis not present

## 2016-06-27 NOTE — Patient Instructions (Signed)
Medication Instructions:  Your physician recommends that you continue on your current medications as directed. Please refer to the Current Medication list given to you today.  Labwork: No new orders.   Testing/Procedures: Your physician has requested that you have a carotid duplex in April 2019 (do not do in [place] to get studies aligned). This test is an ultrasound of the carotid arteries in your neck. It looks at blood flow through these arteries that supply the brain with blood. Allow one hour for this exam. There are no restrictions or special instructions.  Your physician has requested that you have an ankle brachial index (ABI) in April 2019. During this test an ultrasound and blood pressure cuff are used to evaluate the arteries that supply the arms and legs with blood. Allow thirty minutes for this exam. There are no restrictions or special instructions.  Your physician has requested that you have an aorto-iliac duplex in April 2019. During this test, an ultrasound is used to evaluate the aorta and iliac arteries. Allow 30 minutes for this exam. Do not eat after midnight the day before and avoid carbonated beverages  Follow-Up: Your physician wants you to follow-up in: 1 YEAR with Dr Fletcher Anon.  You will receive a reminder letter in the mail two months in advance. If you don't receive a letter, please call our office to schedule the follow-up appointment.   Any Other Special Instructions Will Be Listed Below (If Applicable).     If you need a refill on your cardiac medications before your next appointment, please call your pharmacy.

## 2016-06-27 NOTE — Progress Notes (Signed)
Cardiology Office Note   Date:  06/27/2016   ID:  Danny [name], DOB 08-13-1926, MRN [id number]  PCP:  Irven Shelling, MD  Cardiologist: Dr. Tamala Julian  Chief Complaint  Patient presents with  . Follow-up    testing done today       History of Present Illness: Danny [name] is a 83 y.o. male who is here today for follow-up visit regarding  peripheral arterial disease. He has known history of coronary artery disease status post CABG more than 10 years ago. He had bilateral carotid endarterectomy, chronic kidney disease, hypertension, bilateral common iliac artery stenting and hyperlipidemia. He quit smoking more than 20 years ago.  He underwent bilateral kissing stent placement to the common iliac arteries in May of 2016 for severe claudication. He has been doing well overall with stable mild bilateral calf claudication. No chest pain or shortness of breath.  He had problems with gout recently and was placed on allopurinol. He also had issues with leg edema and weight gain which improved with the addition of small dose hydrochlorothiazide. Metoprolol dose was decreased.  Past Medical History:  Diagnosis Date  . Basal cell carcinoma of nose    "right side; burned it off"  . Carotid artery disease ([place])    a. s/p prior carotid endarterectomy.  . Cerebrovascular disease   . Chronic kidney disease (CKD), stage III (moderate)   . Coronary artery disease   . ED (erectile dysfunction)   . History of gout   . Hyperlipidemia   . Hypertension   . Insomnia   . Myocardial infarction ([place]) 2002  . PVD (peripheral vascular disease) ([place])    a. s/p right and left common iliac artery angioplasty with bilateral stent placement.  . Syncope   . Unequal blood pressure in upper extremities     Past Surgical History:  Procedure Laterality Date  . ABDOMINAL HERNIA REPAIR  X 4  . APPENDECTOMY    . CAROTID ENDARTERECTOMY Bilateral   . CORONARY ANGIOPLASTY WITH STENT PLACEMENT     "~ 6  months before bypass"  . CORONARY ARTERY BYPASS GRAFT  2002   "CABG X4"  . HERNIA REPAIR    . PERIPHERAL VASCULAR CATHETERIZATION N/A 08/05/2014   Procedure: Abdominal Aortogram;  Surgeon: Wellington Hampshire, MD;  Location: [facility];  Service: Cardiovascular;  Laterality: N/A;  . PERIPHERAL VASCULAR CATHETERIZATION Bilateral 08/05/2014   Procedure: Peripheral Vascular Intervention;  Surgeon: Wellington Hampshire, MD;  Location: [facility];  Service: Cardiovascular;  Laterality: Bilateral;  iliacs     Current Outpatient Prescriptions  Medication Sig Dispense Refill  . allopurinol (ZYLOPRIM) 100 MG tablet Take 1 tablet by mouth daily.    Marland Kitchen amLODipine (NORVASC) 10 MG tablet Take 10 mg by mouth daily.    Marland Kitchen aspirin EC 81 MG tablet Take 81 mg by mouth daily.    Marland Kitchen atorvastatin (LIPITOR) 40 MG tablet Take 40 mg by mouth daily.    . cholecalciferol (VITAMIN D) 1000 UNITS tablet Take 1,000 Units by mouth daily.    . hydrochlorothiazide (MICROZIDE) 12.5 MG capsule Take 1 capsule (12.5 mg total) by mouth daily. 30 capsule 3  . lisinopril (PRINIVIL,ZESTRIL) 20 MG tablet Take 1 tablet (20 mg total) by mouth daily. 90 tablet 3  . metoprolol tartrate (LOPRESSOR) 25 MG tablet Take 1 tablet (25 mg total) by mouth 2 (two) times daily. 180 tablet 3  . nitroGLYCERIN (NITROSTAT) 0.4 MG SL tablet Place 0.4 mg  under the tongue every 5 (five) minutes as needed for chest pain.      No current facility-administered medications for this visit.     Allergies:   Clindamycin/lincomycin    Social History:  The patient  reports that he has quit smoking. His smoking use included Cigarettes. He has a 56.00 pack-year smoking history. He has never used smokeless tobacco. He reports that he does not drink alcohol or use drugs.   Family History:  The patient's family history includes Heart failure in his father and mother.    ROS:  Please see the history of present illness.   Otherwise, review of systems are  positive for discoloration and lower extremity. Family also suggested there is diminished cognitive function.   All other systems are reviewed and negative.    PHYSICAL EXAM: VS:  BP (!) 148/56   Pulse 62   Ht 5\' 6"  (1.676 m)   Wt 166 lb (75.3 kg)   BMI 26.79 kg/m  , BMI Body mass index is 26.79 kg/m. GEN: Well nourished, well developed, in no acute distress  HEENT: normal  Neck: no JVD, carotid bruits, or masses. Right carotid bruit is heard. Cardiac: RRR; rubs, or gallops,no edema . Respiratory:  clear to auscultation bilaterally, normal work of breathing GI: soft, nontender, nondistended, + BS MS: no deformity or atrophy  Skin: warm and dry, no rash Neuro:  Strength and sensation are intact Psych: euthymic mood, full affect Vascular: Femoral pulses are+1  bilaterally. Distal pulses are not palpable. Radial pulses are normal bilaterally.    Recent Labs: 06/12/2016: BUN 22; Creatinine, Ser 1.59; Potassium 4.2; Sodium 142    Lipid Panel No results found for: CHOL, TRIG, HDL, CHOLHDL, VLDL, LDLCALC, LDLDIRECT    Wt Readings from Last 3 Encounters:  06/27/16 166 lb (75.3 kg)  06/05/16 172 lb (78 kg)  12/21/15 163 lb (73.9 kg)        ASSESSMENT AND PLAN:  1. Peripheral arterial disease:  He had vascular studies done overall were stable with moderately reduced ABI similar to his most recent testing. Duplex showed stable distal aortic and iliac stenosis. Continue medical therapy and repeat studies in one year.  2. Atherosclerosis of native coronary artery of native heart with other form of angina pectoris:  He is stable with no significant angina. Continue medical therapy.  3. Bilateral carotid stenosis: Carotid Doppler today  showed 40-59% bilateral ICA stenosis.  Repeat study in one year. He wants to have the study done with his other vascular studies in one year.  4. Essential hypertension: Blood pressure is controlled on current medications.    Follow-up with me in  12 months.  Signed,  Kathlyn Sacramento, MD  06/27/2016 10:46 AM

## 2016-06-30 ENCOUNTER — Ambulatory Visit (INDEPENDENT_AMBULATORY_CARE_PROVIDER_SITE_OTHER): Payer: Medicare Other | Admitting: Pharmacist

## 2016-06-30 VITALS — BP 138/50 | HR 62 | Wt 165.2 lb

## 2016-06-30 DIAGNOSIS — I10 Essential (primary) hypertension: Secondary | ICD-10-CM | POA: Diagnosis not present

## 2016-06-30 NOTE — Patient Instructions (Signed)
Return for a follow up appointment as recommended with Dr. Tamala Julian  Check your blood pressure at home daily (if able) and keep record of the readings.  Take your BP meds as follows: Continue all medications as prescribed.   Bring all of your meds, your BP cuff and your record of home blood pressures to your next appointment.  Exercise as you're able, try to walk approximately 30 minutes per day.  Keep salt intake to a minimum, especially watch canned and prepared boxed foods.  Eat more fresh fruits and vegetables and fewer canned items.  Avoid eating in fast food restaurants.    HOW TO TAKE YOUR BLOOD PRESSURE: . Rest 5 minutes before taking your blood pressure. .  Don't smoke or drink caffeinated beverages for at least 30 minutes before. . Take your blood pressure before (not after) you eat. . Sit comfortably with your back supported and both feet on the floor (don't cross your legs). . Elevate your arm to heart level on a table or a desk. . Use the proper sized cuff. It should fit smoothly and snugly around your bare upper arm. There should be enough room to slip a fingertip under the cuff. The bottom edge of the cuff should be 1 inch above the crease of the elbow. . Ideally, take 3 measurements at one sitting and record the average.

## 2016-06-30 NOTE — Progress Notes (Signed)
Patient ID: Danny [name]                 DOB: 08-Nov-1926                      MRN: [id number]     HPI: Danny [name] is a 83 y.o. male patient of Dr. Tamala Julian who presents today for hypertension evaluation. PMH includes coronary artery disease status post CABG more than 10 years ago. He had bilateral carotid endarterectomy, chronic kidney disease, hypertension, bilateral common iliac artery stenting and hyperlipidemia. At his most recent visit with Dr. Tamala Julian he was started on hydrochlorothiazide 12.5mg  daily. He had a metabolic panel since then that returned with normal kidney function and electrolytes.   He presents today with his daughter. He reports that the swelling is much improved. He has not noticed any urinary symptoms since starting hydrochlorothiazide. He states he has been doing well since the medication changes. His believes his pressure is much improved. Denies dizziness, SOB, orthostatics.   Current HTN meds:  Metoprolol tartrate 25mg  BID Lisinopril 20mg  daily at night  Hydrochlorothiazide 12.5mg  daily in the morning Amlodipine 5mg  daily qam   BP goal: <140/90 (given age)  Family History: The patient's family history includes Heart failure in his father and mother.   Social History: quit smoking more than 20 years ago.   Home BP readings:  140s/60s.   Wt Readings from Last 3 Encounters:  06/30/16 165 lb 4 oz (75 kg)  06/27/16 166 lb (75.3 kg)  06/05/16 172 lb (78 kg)   BP Readings from Last 3 Encounters:  06/30/16 (!) 138/50  06/27/16 (!) 148/56  06/05/16 (!) 144/62   Pulse Readings from Last 3 Encounters:  06/30/16 62  06/27/16 62  06/05/16 (!) 50    Renal function: Estimated Creatinine Clearance: 29.5 mL/min (A) (by C-G formula based on SCr of 1.59 mg/dL (H)).  Past Medical History:  Diagnosis Date  . Basal cell carcinoma of nose    "right side; burned it off"  . Carotid artery disease ([place])    a. s/p prior carotid endarterectomy.  . Cerebrovascular  disease   . Chronic kidney disease (CKD), stage III (moderate)   . Coronary artery disease   . ED (erectile dysfunction)   . History of gout   . Hyperlipidemia   . Hypertension   . Insomnia   . Myocardial infarction ([place]) 2002  . PVD (peripheral vascular disease) ([place])    a. s/p right and left common iliac artery angioplasty with bilateral stent placement.  . Syncope   . Unequal blood pressure in upper extremities     Current Outpatient Prescriptions on File Prior to Visit  Medication Sig Dispense Refill  . allopurinol (ZYLOPRIM) 100 MG tablet Take 1 tablet by mouth daily.    Marland Kitchen amLODipine (NORVASC) 10 MG tablet Take 5 mg by mouth daily.     Marland Kitchen aspirin EC 81 MG tablet Take 81 mg by mouth daily.    Marland Kitchen atorvastatin (LIPITOR) 40 MG tablet Take 40 mg by mouth daily.    . cholecalciferol (VITAMIN D) 1000 UNITS tablet Take 1,000 Units by mouth daily.    . hydrochlorothiazide (MICROZIDE) 12.5 MG capsule Take 1 capsule (12.5 mg total) by mouth daily. 30 capsule 3  . lisinopril (PRINIVIL,ZESTRIL) 20 MG tablet Take 1 tablet (20 mg total) by mouth daily. 90 tablet 3  . metoprolol tartrate (LOPRESSOR) 25 MG tablet Take 1 tablet (25 mg total) by mouth  2 (two) times daily. 180 tablet 3  . nitroGLYCERIN (NITROSTAT) 0.4 MG SL tablet Place 0.4 mg under the tongue every 5 (five) minutes as needed for chest pain.      No current facility-administered medications on file prior to visit.     Allergies  Allergen Reactions  . Clindamycin/Lincomycin Other (See Comments)    Pt states caused c-diff and hospitalization    Blood pressure (!) 138/50, pulse 62, weight 165 lb 4 oz (75 kg).   Assessment/Plan: Hypertension: BP today below goal and patient doing well on current regimen.  No medication changes at this time. Follow up with Dr. Tamala Julian as recommended and HTN clinic as needed.    Thank you, Lelan Pons. Patterson Hammers[place], [place]

## 2016-07-03 ENCOUNTER — Encounter: Payer: Self-pay | Admitting: Pharmacist

## 2016-07-27 ENCOUNTER — Other Ambulatory Visit: Payer: Self-pay | Admitting: *Deleted

## 2016-07-27 MED ORDER — HYDROCHLOROTHIAZIDE 12.5 MG PO CAPS
12.5000 mg | ORAL_CAPSULE | Freq: Every day | ORAL | 3 refills | Status: DC
Start: 2016-07-27 — End: 2017-06-14

## 2016-12-26 ENCOUNTER — Other Ambulatory Visit (HOSPITAL_COMMUNITY): Payer: Self-pay | Admitting: Internal Medicine

## 2016-12-26 DIAGNOSIS — I6523 Occlusion and stenosis of bilateral carotid arteries: Secondary | ICD-10-CM

## 2017-01-08 ENCOUNTER — Ambulatory Visit (HOSPITAL_COMMUNITY)
Admission: RE | Admit: 2017-01-08 | Discharge: 2017-01-08 | Disposition: A | Discharge status: Home / Self Care | Payer: Medicare Other | Source: Ambulatory Visit | Attending: Cardiovascular Disease | Admitting: Cardiovascular Disease

## 2017-01-08 DIAGNOSIS — I6523 Occlusion and stenosis of bilateral carotid arteries: Secondary | ICD-10-CM | POA: Insufficient documentation

## 2017-02-06 ENCOUNTER — Telehealth: Payer: Self-pay | Admitting: Cardiovascular Disease

## 2017-02-06 DIAGNOSIS — Z9889 Other specified postprocedural states: Secondary | ICD-10-CM

## 2017-02-06 DIAGNOSIS — I739 Peripheral vascular disease, unspecified: Secondary | ICD-10-CM

## 2017-02-06 NOTE — Telephone Encounter (Signed)
Left message to call back  

## 2017-02-06 NOTE — Telephone Encounter (Signed)
New message    Daughter calling for results of carotid. Please call

## 2017-02-06 NOTE — Telephone Encounter (Signed)
Patient's daughter has been made aware of the results and verbalized her understanding.  She stated that the patient has been having neck pain for the past week and has been using a heating pad for relief. The patient did state that the pain is somewhat better today. She is concerned and would like to know if the patient needs to be seen.  Per. Dr. Fletcher Anon, the carotids should not cause any pain. If the pain continues they may call his PCP. She verbalized her understanding.

## 2017-02-06 NOTE — Telephone Encounter (Signed)
Stable moderate stenosis. Repeat study in 1 year.

## 2017-02-06 NOTE — Telephone Encounter (Signed)
F/u message  Pt wife returning RN call .please call back to discuss

## 2017-04-26 ENCOUNTER — Other Ambulatory Visit: Payer: Self-pay

## 2017-04-26 ENCOUNTER — Telehealth: Payer: Self-pay | Admitting: Cardiovascular Disease

## 2017-04-26 DIAGNOSIS — I739 Peripheral vascular disease, unspecified: Secondary | ICD-10-CM

## 2017-04-26 NOTE — Telephone Encounter (Signed)
New message    Patient daughter calling to request order for aorta study/ Please advise

## 2017-04-26 NOTE — Telephone Encounter (Signed)
Returned call to patient's daughter.She stated she would father to have aorta doppler same day he sees Dr.Arida 07/10/17.Advised scheduler will call back to schedule appointment.

## 2017-05-18 ENCOUNTER — Other Ambulatory Visit: Payer: Self-pay | Admitting: *Deleted

## 2017-05-18 MED ORDER — METOPROLOL TARTRATE 25 MG PO TABS: 25.0000 mg | ORAL_TABLET | Freq: Two times a day (BID) | ORAL | 0 refills | Status: DC

## 2017-06-14 ENCOUNTER — Encounter (INDEPENDENT_AMBULATORY_CARE_PROVIDER_SITE_OTHER): Payer: Self-pay

## 2017-06-14 ENCOUNTER — Encounter: Payer: Self-pay | Admitting: Interventional Cardiology

## 2017-06-14 ENCOUNTER — Ambulatory Visit: Payer: Medicare Other | Admitting: Interventional Cardiology

## 2017-06-14 VITALS — BP 108/50 | HR 65 | Ht 66.0 in | Wt 172.8 lb

## 2017-06-14 DIAGNOSIS — N184 Chronic kidney disease, stage 4 (severe): Secondary | ICD-10-CM | POA: Diagnosis not present

## 2017-06-14 DIAGNOSIS — E7849 Other hyperlipidemia: Secondary | ICD-10-CM

## 2017-06-14 DIAGNOSIS — I25709 Atherosclerosis of coronary artery bypass graft(s), unspecified, with unspecified angina pectoris: Secondary | ICD-10-CM | POA: Diagnosis not present

## 2017-06-14 DIAGNOSIS — I739 Peripheral vascular disease, unspecified: Secondary | ICD-10-CM | POA: Diagnosis not present

## 2017-06-14 DIAGNOSIS — I10 Essential (primary) hypertension: Secondary | ICD-10-CM

## 2017-06-14 NOTE — Patient Instructions (Signed)

## 2017-06-14 NOTE — Progress Notes (Signed)
Cardiology Office Note    Date:  06/14/2017   ID:  Danny [name], DOB Jun 06, 1926, MRN [id number]  PCP:  Lavone Orn, MD  Cardiologist: Sinclair Grooms, MD   Chief Complaint  Patient presents with  . Coronary Artery Disease    History of Present Illness:  Danny [name] is a 84 y.o. male  who presents for coronary artery disease and prior coronary bypass (LIMA to LAD, SVG to ramus, SVG to obtuse marginal, and SVG to PDA. 2002), hypertension, hyperlipidemia, PAD with bilateral iliac stent procedure 2016 by Dr. Fletcher Anon, carotid disease (bilat CEA), and chronic kidney disease.  [name] is doing well.  He is accompanied by his daughter.  He is starting to have some claudication although he is still markedly improved compared to 2016.  On occasion he will have claudication and at other times for the same level of activity it does not occur.  Rest relieves the discomfort.  Denies angina.  Has not needed to use nitroglycerin.  Status post bilateral carotid endarterectomy.  No recent or concerning neurological complaints.   Past Medical History:  Diagnosis Date  . Basal cell carcinoma of nose    "right side; burned it off"  . Carotid artery disease ([place])    a. s/p prior carotid endarterectomy.  . Cerebrovascular disease   . Chronic kidney disease (CKD), stage III (moderate) ([facility])   . Coronary artery disease   . ED (erectile dysfunction)   . History of gout   . Hyperlipidemia   . Hypertension   . Insomnia   . Myocardial infarction ([place]) 2002  . PVD (peripheral vascular disease) ([place])    a. s/p right and left common iliac artery angioplasty with bilateral stent placement.  . Syncope   . Unequal blood pressure in upper extremities     Past Surgical History:  Procedure Laterality Date  . ABDOMINAL HERNIA REPAIR  X 4  . APPENDECTOMY    . CAROTID ENDARTERECTOMY Bilateral   . CORONARY ANGIOPLASTY WITH STENT PLACEMENT     "~ 6 months before bypass"  . CORONARY ARTERY BYPASS  GRAFT  2002   "CABG X4"  . HERNIA REPAIR    . PERIPHERAL VASCULAR CATHETERIZATION N/A 08/05/2014   Procedure: Abdominal Aortogram;  Surgeon: Wellington Hampshire, MD;  Location: [facility];  Service: Cardiovascular;  Laterality: N/A;  . PERIPHERAL VASCULAR CATHETERIZATION Bilateral 08/05/2014   Procedure: Peripheral Vascular Intervention;  Surgeon: Wellington Hampshire, MD;  Location: [facility];  Service: Cardiovascular;  Laterality: Bilateral;  iliacs    Current Medications: Outpatient Medications Prior to Visit  Medication Sig Dispense Refill  . allopurinol (ZYLOPRIM) 100 MG tablet Take 1 tablet by mouth daily.    Marland Kitchen amLODipine (NORVASC) 10 MG tablet Take 5 mg by mouth daily.     Marland Kitchen aspirin EC 81 MG tablet Take 81 mg by mouth daily.    Marland Kitchen atorvastatin (LIPITOR) 40 MG tablet Take 40 mg by mouth daily.    . cholecalciferol (VITAMIN D) 1000 UNITS tablet Take 1,000 Units by mouth daily.    . hydrochlorothiazide (MICROZIDE) 12.5 MG capsule Take 12.5 mg by mouth daily.    Marland Kitchen lisinopril (PRINIVIL,ZESTRIL) 20 MG tablet Take 20 mg by mouth daily.    . metoprolol tartrate (LOPRESSOR) 25 MG tablet Take 1 tablet (25 mg total) by mouth 2 (two) times daily. 180 tablet 0  . nitroGLYCERIN (NITROSTAT) 0.4 MG SL tablet Place 0.4 mg under the tongue every 5 (  five) minutes as needed for chest pain.     . hydrochlorothiazide (MICROZIDE) 12.5 MG capsule Take 1 capsule (12.5 mg total) by mouth daily. 90 capsule 3  . lisinopril (PRINIVIL,ZESTRIL) 20 MG tablet Take 1 tablet (20 mg total) by mouth daily. 90 tablet 3   No facility-administered medications prior to visit.      Allergies:   Clindamycin/lincomycin   Social History   Socioeconomic History  . Marital status: Widowed    Spouse name: Not on file  . Number of children: Not on file  . Years of education: Not on file  . Highest education level: Not on file  Occupational History  . Not on file  Social Needs  . Financial resource strain: Not  on file  . Food insecurity:    Worry: Not on file    Inability: Not on file  . Transportation needs:    Medical: Not on file    Non-medical: Not on file  Tobacco Use  . Smoking status: Former Smoker    Packs/day: 1.00    Years: 56.00    Pack years: 56.00    Types: Cigarettes  . Smokeless tobacco: Never Used  . Tobacco comment: "stopped smoking when I had MI in 2002"  Substance and Sexual Activity  . Alcohol use: No  . Drug use: No  . Sexual activity: Not on file  Lifestyle  . Physical activity:    Days per week: Not on file    Minutes per session: Not on file  . Stress: Not on file  Relationships  . Social connections:    Talks on phone: Not on file    Gets together: Not on file    Attends religious service: Not on file    Active member of club or organization: Not on file    Attends meetings of clubs or organizations: Not on file    Relationship status: Not on file  Other Topics Concern  . Not on file  Social History Narrative  . Not on file     Family History:  The patient's family history includes Heart failure in his father and mother.   ROS:   Please see the history of present illness.    None noted.  Fatigues easily.  Has to rest more now than previously. All other systems reviewed and are negative.   PHYSICAL EXAM:   VS:  BP (!) 108/50   Pulse 65   Ht 5\' 6"  (1.676 m)   Wt 172 lb 12.8 oz (78.4 kg)   SpO2 96%   BMI 27.89 kg/m    GEN: Well nourished, well developed, in no acute distress  HEENT: normal  Neck: no JVD, carotid bruits, or masses Cardiac: RRR; no murmurs, rubs, or gallops,no edema  Respiratory:  clear to auscultation bilaterally, normal work of breathing GI: soft, nontender, nondistended, + BS MS: no deformity or atrophy  Skin: warm and dry, no rash Neuro:  Alert and Oriented x 3, Strength and sensation are intact Psych: euthymic mood, full affect  Wt Readings from Last 3 Encounters:  06/14/17 172 lb 12.8 oz (78.4 kg)  06/30/16 165 lb  4 oz (75 kg)  06/27/16 166 lb (75.3 kg)      Studies/Labs Reviewed:   EKG:  EKG sinus rhythm, 65 bpm, LVH with strain.  And when compared to prior tracing from March 2018, T wave abnormality is new.  Recent Labs: No results found for requested labs within last 8760 hours.   Lipid Panel  No results found for: CHOL, TRIG, HDL, CHOLHDL, VLDL, LDLCALC, LDLDIRECT  Additional studies/ records that were reviewed today include:  None    ASSESSMENT:    1. Coronary artery disease involving coronary bypass graft of native heart with angina pectoris ([place])   2. CKD (chronic kidney disease) stage 4, GFR 15-29 ml/min ([facility])   3. Essential hypertension, benign   4. Other hyperlipidemia   5. PAD (peripheral artery disease) ([facility])      PLAN:  In order of problems listed above:  1. He is doing well without angina.  No nitroglycerin use has been required. 2. Kidney function is followed by primary physician, Dr. Inda Merlin.  Stable when last evaluate last month with creatinine of one-point 3. Initial blood pressure relatively low.  Reevaluation 125/50 mmHg.  I believe the blood pressures in good shape. 4. LDL cholesterol target is less than 70 and when last evaluated 1 month ago was 78.  No adjustment is needed.    Medication Adjustments/Labs and Tests Ordered: Current medicines are reviewed at length with the patient today.  Concerns regarding medicines are outlined above.  Medication changes, Labs and Tests ordered today are listed in the Patient Instructions below. Patient Instructions  Medication Instructions:  Your physician recommends that you continue on your current medications as directed. Please refer to the Current Medication list given to you today.  Labwork: None  Testing/Procedures: None  Follow-Up: Your physician wants you to follow-up in: 1 year with Dr. Tamala Julian.  You will receive a reminder letter in the mail two months in advance. If you don't receive a letter, please call our  office to schedule the follow-up appointment.   Any Other Special Instructions Will Be Listed Below (If Applicable).     If you need a refill on your cardiac medications before your next appointment, please call your pharmacy.      Signed, Sinclair Grooms, MD  06/14/2017 4:18 PM    [facility] [place], [place], [place]  [address] Phone: [phone]; Fax: [phone]

## 2017-06-29 ENCOUNTER — Other Ambulatory Visit: Payer: Self-pay | Admitting: Cardiovascular Disease

## 2017-06-29 DIAGNOSIS — I739 Peripheral vascular disease, unspecified: Secondary | ICD-10-CM

## 2017-07-10 ENCOUNTER — Ambulatory Visit: Payer: Medicare Other | Admitting: Cardiovascular Disease

## 2017-07-10 ENCOUNTER — Ambulatory Visit (HOSPITAL_COMMUNITY)
Admission: RE | Admit: 2017-07-10 | Discharge: 2017-07-10 | Disposition: A | Discharge status: Home / Self Care | Payer: Medicare Other | Source: Ambulatory Visit | Attending: Internal Medicine | Admitting: Internal Medicine

## 2017-07-10 ENCOUNTER — Encounter: Payer: Self-pay | Admitting: Cardiovascular Disease

## 2017-07-10 VITALS — BP 110/62 | HR 64 | Ht 66.0 in | Wt 173.0 lb

## 2017-07-10 DIAGNOSIS — E7849 Other hyperlipidemia: Secondary | ICD-10-CM

## 2017-07-10 DIAGNOSIS — E785 Hyperlipidemia, unspecified: Secondary | ICD-10-CM | POA: Insufficient documentation

## 2017-07-10 DIAGNOSIS — Z95828 Presence of other vascular implants and grafts: Secondary | ICD-10-CM | POA: Insufficient documentation

## 2017-07-10 DIAGNOSIS — I779 Disorder of arteries and arterioles, unspecified: Secondary | ICD-10-CM | POA: Diagnosis not present

## 2017-07-10 DIAGNOSIS — E669 Obesity, unspecified: Secondary | ICD-10-CM | POA: Diagnosis not present

## 2017-07-10 DIAGNOSIS — I708 Atherosclerosis of other arteries: Secondary | ICD-10-CM | POA: Insufficient documentation

## 2017-07-10 DIAGNOSIS — I2510 Atherosclerotic heart disease of native coronary artery without angina pectoris: Secondary | ICD-10-CM | POA: Insufficient documentation

## 2017-07-10 DIAGNOSIS — M79609 Pain in unspecified limb: Secondary | ICD-10-CM | POA: Diagnosis not present

## 2017-07-10 DIAGNOSIS — I700 Atherosclerosis of aorta: Secondary | ICD-10-CM | POA: Insufficient documentation

## 2017-07-10 DIAGNOSIS — I10 Essential (primary) hypertension: Secondary | ICD-10-CM | POA: Diagnosis not present

## 2017-07-10 DIAGNOSIS — Z87891 Personal history of nicotine dependence: Secondary | ICD-10-CM | POA: Diagnosis not present

## 2017-07-10 DIAGNOSIS — M79606 Pain in leg, unspecified: Secondary | ICD-10-CM | POA: Diagnosis not present

## 2017-07-10 DIAGNOSIS — I739 Peripheral vascular disease, unspecified: Secondary | ICD-10-CM | POA: Insufficient documentation

## 2017-07-10 DIAGNOSIS — I25118 Atherosclerotic heart disease of native coronary artery with other forms of angina pectoris: Secondary | ICD-10-CM | POA: Diagnosis not present

## 2017-07-10 NOTE — Progress Notes (Signed)
Cardiology Office Note   Date:  07/10/2017   ID:  Danny [name], DOB 1926-07-08, MRN [id number]  PCP:  Lavone Orn, MD  Cardiologist: Dr. Tamala Julian  Chief Complaint  Patient presents with  . Follow-up      History of Present Illness: Danny [name] is a 84 y.o. male who is here today for follow-up visit regarding  peripheral arterial disease. He has known history of coronary artery disease status post CABG more than 10 years ago. He had bilateral carotid endarterectomy, chronic kidney disease, hypertension, bilateral common iliac artery stenting and hyperlipidemia. He quit smoking more than 20 years ago.  He underwent bilateral kissing stent placement to the common iliac arteries in May of 2016 for severe claudication.  He reports slight worsening of bilateral calf claudication since last year.  No chest pain or shortness of breath.  He has no rest pain no lower extremity ulceration.  He continues to be relatively active for his age and does yard work. Gout has been controlled after the addition of allopurinol.  Past Medical History:  Diagnosis Date  . Basal cell carcinoma of nose    "right side; burned it off"  . Carotid artery disease ([place])    a. s/p prior carotid endarterectomy.  . Cerebrovascular disease   . Chronic kidney disease (CKD), stage III (moderate) (HCC)   . Coronary artery disease   . ED (erectile dysfunction)   . History of gout   . Hyperlipidemia   . Hypertension   . Insomnia   . Myocardial infarction ([place]) 2002  . PVD (peripheral vascular disease) ([place])    a. s/p right and left common iliac artery angioplasty with bilateral stent placement.  . Syncope   . Unequal blood pressure in upper extremities     Past Surgical History:  Procedure Laterality Date  . ABDOMINAL HERNIA REPAIR  X 4  . APPENDECTOMY    . CAROTID ENDARTERECTOMY Bilateral   . CORONARY ANGIOPLASTY WITH STENT PLACEMENT     "~ 6 months before bypass"  . CORONARY ARTERY BYPASS GRAFT   2002   "CABG X4"  . HERNIA REPAIR    . PERIPHERAL VASCULAR CATHETERIZATION N/A 08/05/2014   Procedure: Abdominal Aortogram;  Surgeon: Wellington Hampshire, MD;  Location: [facility];  Service: Cardiovascular;  Laterality: N/A;  . PERIPHERAL VASCULAR CATHETERIZATION Bilateral 08/05/2014   Procedure: Peripheral Vascular Intervention;  Surgeon: Wellington Hampshire, MD;  Location: [facility];  Service: Cardiovascular;  Laterality: Bilateral;  iliacs     Current Outpatient Medications  Medication Sig Dispense Refill  . allopurinol (ZYLOPRIM) 100 MG tablet Take 1 tablet by mouth daily.    Marland Kitchen amLODipine (NORVASC) 10 MG tablet Take 5 mg by mouth daily.     Marland Kitchen aspirin EC 81 MG tablet Take 81 mg by mouth daily.    Marland Kitchen atorvastatin (LIPITOR) 40 MG tablet Take 40 mg by mouth daily.    . cholecalciferol (VITAMIN D) 1000 UNITS tablet Take 1,000 Units by mouth daily.    . hydrochlorothiazide (MICROZIDE) 12.5 MG capsule Take 12.5 mg by mouth daily.    Marland Kitchen lisinopril (PRINIVIL,ZESTRIL) 20 MG tablet Take 20 mg by mouth daily.    . metoprolol tartrate (LOPRESSOR) 25 MG tablet Take 1 tablet (25 mg total) by mouth 2 (two) times daily. 180 tablet 0  . nitroGLYCERIN (NITROSTAT) 0.4 MG SL tablet Place 0.4 mg under the tongue every 5 (five) minutes as needed for chest pain.  No current facility-administered medications for this visit.     Allergies:   Clindamycin/lincomycin    Social History:  The patient  reports that he has quit smoking. His smoking use included cigarettes. He has a 56.00 pack-year smoking history. He has never used smokeless tobacco. He reports that he does not drink alcohol or use drugs.   Family History:  The patient's family history includes Heart failure in his father and mother.    ROS:  Please see the history of present illness.   Otherwise, review of systems are positive for discoloration and lower extremity. Family also suggested there is diminished cognitive function.   All  other systems are reviewed and negative.    PHYSICAL EXAM: VS:  BP 110/62   Pulse 64   Ht 5\' 6"  (1.676 m)   Wt 173 lb (78.5 kg)   BMI 27.92 kg/m  , BMI Body mass index is 27.92 kg/m. GEN: Well nourished, well developed, in no acute distress  HEENT: normal  Neck: no JVD, carotid bruits, or masses. Right carotid bruit is heard. Cardiac: RRR; rubs, or gallops,no edema . Respiratory:  clear to auscultation bilaterally, normal work of breathing GI: soft, nontender, nondistended, + BS MS: no deformity or atrophy  Skin: warm and dry, no rash Neuro:  Strength and sensation are intact Psych: euthymic mood, full affect Vascular: Femoral pulses are+1  bilaterally.      Recent Labs: No results found for requested labs within last 8760 hours.    Lipid Panel No results found for: CHOL, TRIG, HDL, CHOLHDL, VLDL, LDLCALC, LDLDIRECT    Wt Readings from Last 3 Encounters:  07/10/17 173 lb (78.5 kg)  06/14/17 172 lb 12.8 oz (78.4 kg)  06/30/16 165 lb 4 oz (75 kg)        ASSESSMENT AND PLAN:  1. Peripheral arterial disease:  Moderate bilateral leg claudication .  Vascular studies today showed stable ABI at 0.65 bilaterally.  Duplex showed progression of in-stent restenosis and bilateral common iliac arteries.  His symptoms are still not lifestyle limiting and considering his age and comorbidities, I recommend continuing medical therapy for now with repeat studies in 1 year.   2. Atherosclerosis of native coronary artery of native heart with other form of angina pectoris:  He is stable with no significant angina. Continue medical therapy.  3. Bilateral carotid stenosis: Most recent carotid Doppler showed 40-59% bilateral ICA stenosis.  Repeat study in October 2019.  4. Essential hypertension: Blood pressure is controlled on current medications.  5.  Hyperlipidemia: Currently on atorvastatin 40 mg daily.   Follow-up with me in 12 months.  Signed,  Kathlyn Sacramento, MD  07/10/2017  11:24 AM

## 2017-07-10 NOTE — Patient Instructions (Signed)
Medication Instructions: Your physician recommends that you continue on your current medications as directed. Please refer to the Current Medication list given to you today.  If you need a refill on your cardiac medications before your next appointment, please call your pharmacy.   Procedures/Testing: Your physician has requested that you have an aorta and iliac duplex in 12 months. During this test, an ultrasound is used to evaluate blood flow to the aorta and iliac arteries. Allow one hour for this exam. Do not eat after midnight the day before and avoid carbonated beverages. This will be take place at [place], [address].  Your physician has requested that you have an ankle brachial index (ABI) in 12 months. During this test an ultrasound and blood pressure cuff are used to evaluate the arteries that supply the arms and legs with blood. Allow thirty minutes for this exam. There are no restrictions or special instructions. This will take place at [place], [address].   Follow-Up: Your physician wants you to follow-up in 12 months with Dr. Fletcher Anon. You will receive a reminder letter in the mail two months in advance. If you don't receive a letter, please call our office at [phone] to schedule this follow-up appointment.   Thank you for choosing [facility] at [facility]!!

## 2017-07-13 ENCOUNTER — Other Ambulatory Visit: Payer: Self-pay | Admitting: Interventional Cardiology

## 2017-08-20 ENCOUNTER — Other Ambulatory Visit: Payer: Self-pay | Admitting: Interventional Cardiology

## 2017-12-19 ENCOUNTER — Other Ambulatory Visit: Payer: Self-pay

## 2017-12-19 ENCOUNTER — Observation Stay (HOSPITAL_COMMUNITY)
Admission: EM | Admit: 2017-12-19 | Discharge: 2017-12-21 | Disposition: A | Discharge status: Home / Self Care | Payer: Medicare Other | Attending: Internal Medicine | Admitting: Internal Medicine

## 2017-12-19 ENCOUNTER — Encounter (HOSPITAL_COMMUNITY): Payer: Self-pay | Admitting: Emergency Medicine

## 2017-12-19 ENCOUNTER — Emergency Department (HOSPITAL_COMMUNITY): Payer: Medicare Other

## 2017-12-19 DIAGNOSIS — N184 Chronic kidney disease, stage 4 (severe): Secondary | ICD-10-CM | POA: Diagnosis not present

## 2017-12-19 DIAGNOSIS — Z951 Presence of aortocoronary bypass graft: Secondary | ICD-10-CM | POA: Diagnosis not present

## 2017-12-19 DIAGNOSIS — I2510 Atherosclerotic heart disease of native coronary artery without angina pectoris: Secondary | ICD-10-CM | POA: Diagnosis not present

## 2017-12-19 DIAGNOSIS — D631 Anemia in chronic kidney disease: Secondary | ICD-10-CM | POA: Diagnosis not present

## 2017-12-19 DIAGNOSIS — I252 Old myocardial infarction: Secondary | ICD-10-CM | POA: Insufficient documentation

## 2017-12-19 DIAGNOSIS — Z87891 Personal history of nicotine dependence: Secondary | ICD-10-CM | POA: Diagnosis not present

## 2017-12-19 DIAGNOSIS — Z955 Presence of coronary angioplasty implant and graft: Secondary | ICD-10-CM | POA: Diagnosis not present

## 2017-12-19 DIAGNOSIS — Z7982 Long term (current) use of aspirin: Secondary | ICD-10-CM | POA: Insufficient documentation

## 2017-12-19 DIAGNOSIS — R0600 Dyspnea, unspecified: Secondary | ICD-10-CM | POA: Diagnosis not present

## 2017-12-19 DIAGNOSIS — M109 Gout, unspecified: Secondary | ICD-10-CM | POA: Insufficient documentation

## 2017-12-19 DIAGNOSIS — Z8249 Family history of ischemic heart disease and other diseases of the circulatory system: Secondary | ICD-10-CM | POA: Insufficient documentation

## 2017-12-19 DIAGNOSIS — R42 Dizziness and giddiness: Secondary | ICD-10-CM | POA: Diagnosis present

## 2017-12-19 DIAGNOSIS — R55 Syncope and collapse: Secondary | ICD-10-CM | POA: Diagnosis not present

## 2017-12-19 DIAGNOSIS — I129 Hypertensive chronic kidney disease with stage 1 through stage 4 chronic kidney disease, or unspecified chronic kidney disease: Secondary | ICD-10-CM | POA: Insufficient documentation

## 2017-12-19 DIAGNOSIS — I739 Peripheral vascular disease, unspecified: Secondary | ICD-10-CM | POA: Insufficient documentation

## 2017-12-19 DIAGNOSIS — D638 Anemia in other chronic diseases classified elsewhere: Secondary | ICD-10-CM

## 2017-12-19 DIAGNOSIS — I10 Essential (primary) hypertension: Secondary | ICD-10-CM | POA: Diagnosis present

## 2017-12-19 DIAGNOSIS — Z79899 Other long term (current) drug therapy: Secondary | ICD-10-CM | POA: Diagnosis not present

## 2017-12-19 DIAGNOSIS — E785 Hyperlipidemia, unspecified: Secondary | ICD-10-CM | POA: Diagnosis not present

## 2017-12-19 LAB — URINALYSIS, ROUTINE W REFLEX MICROSCOPIC
BACTERIA UA: NONE SEEN
Bilirubin Urine: NEGATIVE
GLUCOSE, UA: NEGATIVE mg/dL
KETONES UR: NEGATIVE mg/dL
LEUKOCYTES UA: NEGATIVE
Nitrite: NEGATIVE
PROTEIN: NEGATIVE mg/dL
Specific Gravity, Urine: 1.014 (ref 1.005–1.030)
pH: 5 (ref 5.0–8.0)

## 2017-12-19 LAB — CBC
HCT: 32.9 % — ABNORMAL LOW (ref 39.0–52.0)
Hemoglobin: 10.8 g/dL — ABNORMAL LOW (ref 13.0–17.0)
MCH: 34.1 pg — ABNORMAL HIGH (ref 26.0–34.0)
MCHC: 32.8 g/dL (ref 30.0–36.0)
MCV: 103.8 fL — AB (ref 80.0–100.0)
NRBC: 0 % (ref 0.0–0.2)
Platelets: 157 10*3/uL (ref 150–400)
RBC: 3.17 MIL/uL — AB (ref 4.22–5.81)
RDW: 13.5 % (ref 11.5–15.5)
WBC: 9.6 10*3/uL (ref 4.0–10.5)

## 2017-12-19 LAB — BASIC METABOLIC PANEL
ANION GAP: 8 (ref 5–15)
BUN: 28 mg/dL — ABNORMAL HIGH (ref 8–23)
CO2: 21 mmol/L — AB (ref 22–32)
Calcium: 9.2 mg/dL (ref 8.9–10.3)
Chloride: 108 mmol/L (ref 98–111)
Creatinine, Ser: 1.59 mg/dL — ABNORMAL HIGH (ref 0.61–1.24)
GFR calc non Af Amer: 37 mL/min — ABNORMAL LOW (ref >60)
GFR, EST AFRICAN AMERICAN: 43 mL/min — AB (ref >60)
Glucose, Bld: 99 mg/dL (ref 70–99)
Potassium: 5.1 mmol/L (ref 3.5–5.1)
SODIUM: 137 mmol/L (ref 135–145)

## 2017-12-19 LAB — TROPONIN I: Troponin I: <0.03 ng/mL (ref <0.03)

## 2017-12-19 LAB — BRAIN NATRIURETIC PEPTIDE: B Natriuretic Peptide: 322.2 pg/mL — ABNORMAL HIGH (ref 0.0–100.0)

## 2017-12-19 MED ORDER — METOPROLOL TARTRATE 25 MG PO TABS
25.0000 mg | ORAL_TABLET | Freq: Two times a day (BID) | ORAL | Status: DC
  Administered 2017-12-19 – 2017-12-21 (×4): 25 mg via ORAL
  Filled 2017-12-19 (×4): qty 1

## 2017-12-19 MED ORDER — ASPIRIN EC 81 MG PO TBEC
81.0000 mg | DELAYED_RELEASE_TABLET | Freq: Every day | ORAL | Status: DC
  Administered 2017-12-20 – 2017-12-21 (×2): 81 mg via ORAL
  Filled 2017-12-19 (×2): qty 1

## 2017-12-19 MED ORDER — SODIUM CHLORIDE 0.9 % IV SOLN
INTRAVENOUS | Status: DC
  Administered 2017-12-19: 22:00:00 via INTRAVENOUS

## 2017-12-19 MED ORDER — ATORVASTATIN CALCIUM 40 MG PO TABS
40.0000 mg | ORAL_TABLET | Freq: Every day | ORAL | Status: DC
  Administered 2017-12-20: 40 mg via ORAL
  Filled 2017-12-19: qty 1

## 2017-12-19 MED ORDER — ALLOPURINOL 100 MG PO TABS
100.0000 mg | ORAL_TABLET | Freq: Every day | ORAL | Status: DC
  Administered 2017-12-19 – 2017-12-20 (×2): 100 mg via ORAL
  Filled 2017-12-19 (×2): qty 1

## 2017-12-19 MED ORDER — VITAMIN B-12 100 MCG PO TABS
100.0000 ug | ORAL_TABLET | Freq: Every day | ORAL | Status: DC
  Administered 2017-12-20 – 2017-12-21 (×2): 100 ug via ORAL
  Filled 2017-12-19 (×2): qty 1

## 2017-12-19 MED ORDER — ENOXAPARIN SODIUM 30 MG/0.3ML ~~LOC~~ SOLN
30.0000 mg | SUBCUTANEOUS | Status: DC
  Administered 2017-12-19 – 2017-12-20 (×2): 30 mg via SUBCUTANEOUS
  Filled 2017-12-19 (×2): qty 0.3

## 2017-12-19 NOTE — ED Notes (Signed)
Per lab, they do not have the light green and purple tube that I previously sent to lab because someone in the lab put in the system "tubes sent in error". Will re-draw and re-send.

## 2017-12-19 NOTE — Plan of Care (Signed)
Patient progressing. Will continue to monitor.

## 2017-12-19 NOTE — H&P (Addendum)
History and Physical    Danny [name] ALP:[id number] DOB: 1927/02/23 DOA: 12/19/2017  PCP: Lavone Orn, MD Patient coming from: Home  Chief Complaint: Nausea, vision turning dark  HPI: Danny [name] is a 84 y.o. male with medical history significant of CKD stage IV, coronary artery disease status post CABG, hypertension, hyperlipidemia, peripheral vascular disease, carotid artery disease status post prior carotid endarterectomy presenting to the hospital via EMS for further evaluation of near syncope.  Patient states he was a passenger in a car today and all of a sudden felt nauseous, hot, and broke out in a cold sweat.  His vision turned dark.  This episode lasted about 15 to 20 minutes.  States he went back to feeling completely normal after 2 hours.  Denies having any vomiting or loss of consciousness.  Denies having any chest pain or shortness of breath.  States his left forearm (below elbow to above the wrist) was tingling when this happened and the tingling also resolved in 15 to 20 minutes.  Reports having similar tingling sensation in the same area about 2 weeks ago.  He did not have slurred speech or any focal weakness.  Denies prior history of seizures.  Per daughter at bedside, the other family member driving the patient did not notice any tongue biting, body shaking, or urinary/fecal incontinence.  Patient states his oral intake has been good; daughter confirmed.  ED Course: Per EMS, orthostatics positive (sitting 126/84 and standing 80/60).  Not tachycardic or hypoxemic.  AAO x4 on arrival.  Blood glucose 99.  Creatinine 1.59 (at baseline).  No leukocytosis.  UA not suggestive of infection.  BNP 322.  I-STAT troponin negative.  EKG with sinus rhythm (heart rate 62) with borderline PR prolongation.  Chest x-ray without pulmonary edema or consolidation; showing upper lobe emphysematous changes with crowding of lower lobe interstitial lung markings.  Review of Systems: As per HPI otherwise  10 point review of systems negative.  Past Medical History:  Diagnosis Date  . Basal cell carcinoma of nose    "right side; burned it off"  . Carotid artery disease ([place])    a. s/p prior carotid endarterectomy.  . Cerebrovascular disease   . Chronic kidney disease (CKD), stage III (moderate) (HCC)   . Coronary artery disease   . ED (erectile dysfunction)   . History of gout   . Hyperlipidemia   . Hypertension   . Insomnia   . Myocardial infarction ([place]) 2002  . PVD (peripheral vascular disease) ([place])    a. s/p right and left common iliac artery angioplasty with bilateral stent placement.  . Syncope   . Unequal blood pressure in upper extremities     Past Surgical History:  Procedure Laterality Date  . ABDOMINAL HERNIA REPAIR  X 4  . APPENDECTOMY    . CAROTID ENDARTERECTOMY Bilateral   . CORONARY ANGIOPLASTY WITH STENT PLACEMENT     "~ 6 months before bypass"  . CORONARY ARTERY BYPASS GRAFT  2002   "CABG X4"  . HERNIA REPAIR    . PERIPHERAL VASCULAR CATHETERIZATION N/A 08/05/2014   Procedure: Abdominal Aortogram;  Surgeon: Wellington Hampshire, MD;  Location: [facility];  Service: Cardiovascular;  Laterality: N/A;  . PERIPHERAL VASCULAR CATHETERIZATION Bilateral 08/05/2014   Procedure: Peripheral Vascular Intervention;  Surgeon: Wellington Hampshire, MD;  Location: [facility];  Service: Cardiovascular;  Laterality: Bilateral;  iliacs     reports that he has quit smoking. His smoking use included  cigarettes. He has a 56.00 pack-year smoking history. He has never used smokeless tobacco. He reports that he does not drink alcohol or use drugs.  Allergies  Allergen Reactions  . Clindamycin/Lincomycin Other (See Comments)    Pt states caused c-diff and hospitalization    Family History  Problem Relation Age of Onset  . Heart failure Mother   . Heart failure Father   . Heart attack Neg Hx   . Hypertension Neg Hx     Prior to Admission medications   Medication Sig  Start Date End Date Taking? Authorizing Provider  allopurinol (ZYLOPRIM) 100 MG tablet Take 1 tablet by mouth at bedtime.  06/05/16  Yes [provider]  amLODipine (NORVASC) 10 MG tablet Take 5 mg by mouth at bedtime.    Yes [provider]  aspirin EC 81 MG tablet Take 81 mg by mouth daily.   Yes [provider]  atorvastatin (LIPITOR) 40 MG tablet Take 40 mg by mouth daily at 6 PM.    Yes [provider]  cyanocobalamin 100 MCG tablet Take 100 mcg by mouth daily.   Yes [provider]  hydrochlorothiazide (MICROZIDE) 12.5 MG capsule Take 1 capsule (12.5 mg total) by mouth daily. 07/13/17  Yes Belva Crome, MD  lisinopril (PRINIVIL,ZESTRIL) 20 MG tablet Take 20 mg by mouth at bedtime.    Yes [provider]  metoprolol tartrate (LOPRESSOR) 25 MG tablet TAKE 1 TABLET BY MOUTH TWICE A DAY Patient taking differently: Take 25 mg by mouth 2 (two) times daily.  08/21/17  Yes Belva Crome, MD  nitroGLYCERIN (NITROSTAT) 0.4 MG SL tablet Place 0.4 mg under the tongue every 5 (five) minutes as needed for chest pain.    Yes [provider]    Physical Exam: Vitals:   12/19/17 1915 12/19/17 2015 12/19/17 2045 12/19/17 2100  BP: 124/88 (!) 132/53 (!) 151/56 (!) 161/67  Pulse: 73 75 74 77  Resp: 15 20 19 19   Temp:      TempSrc:      SpO2: 97% 98% 99% 98%  Weight:      Height:        Physical Exam  Constitutional: He is oriented to person, place, and time. He appears well-developed and well-nourished. No distress.  Resting comfortably in hospital stretcher.  HENT:  Head: Normocephalic and atraumatic.  Mouth/Throat: Oropharynx is clear and moist.  Eyes: Pupils are equal, round, and reactive to light. Right eye exhibits no discharge. Left eye exhibits no discharge.  Neck: Neck supple. No tracheal deviation present.  Cardiovascular: Normal rate, regular rhythm and intact distal pulses. Exam reveals no gallop and no friction rub.    Pulmonary/Chest: Effort normal and breath sounds normal. No respiratory distress. He has no wheezes. He has no rales.  Abdominal: Soft. Bowel sounds are normal. He exhibits no distension. There is no tenderness.  Musculoskeletal: He exhibits no edema.  Neurological: He is alert and oriented to person, place, and time. No cranial nerve deficit.  Speech fluent.  No facial droop.  Tongue midline.  Strength 5 out of 5 in bilateral upper and lower extremities.  Sensation to light touch intact throughout.  Skin: Skin is warm and dry.  Psychiatric: He has a normal mood and affect. His behavior is normal.     Labs on Admission: I have personally reviewed following labs and imaging studies  CBC: Recent Labs  Lab 12/19/17 1839  WBC 9.6  HGB 10.8*  HCT 32.9*  MCV  103.8*  PLT 858   Basic Metabolic Panel: Recent Labs  Lab 12/19/17 1839  NA 137  K 5.1  CL 108  CO2 21*  GLUCOSE 99  BUN 28*  CREATININE 1.59*  CALCIUM 9.2   GFR: Estimated Creatinine Clearance: 28.4 mL/min (A) (by C-G formula based on SCr of 1.59 mg/dL (H)). Liver Function Tests: No results for input(s): AST, ALT, ALKPHOS, BILITOT, PROT, ALBUMIN in the last 168 hours. No results for input(s): LIPASE, AMYLASE in the last 168 hours. No results for input(s): AMMONIA in the last 168 hours. Coagulation Profile: No results for input(s): INR, PROTIME in the last 168 hours. Cardiac Enzymes: Recent Labs  Lab 12/19/17 1841  TROPONINI <0.03   BNP (last 3 results) No results for input(s): PROBNP in the last 8760 hours. HbA1C: No results for input(s): HGBA1C in the last 72 hours. CBG: No results for input(s): GLUCAP in the last 168 hours. Lipid Profile: No results for input(s): CHOL, HDL, LDLCALC, TRIG, CHOLHDL, LDLDIRECT in the last 72 hours. Thyroid Function Tests: No results for input(s): TSH, T4TOTAL, FREET4, T3FREE, THYROIDAB in the last 72 hours. Anemia Panel: No results for input(s): VITAMINB12, FOLATE,  FERRITIN, TIBC, IRON, RETICCTPCT in the last 72 hours. Urine analysis:    Component Value Date/Time   COLORURINE YELLOW 12/19/2017 [place] 12/19/2017 1451   LABSPEC 1.014 12/19/2017 1451   PHURINE 5.0 12/19/2017 1451   GLUCOSEU NEGATIVE 12/19/2017 1451   HGBUR SMALL (A) 12/19/2017 1451   BILIRUBINUR NEGATIVE 12/19/2017 1451   KETONESUR NEGATIVE 12/19/2017 1451   PROTEINUR NEGATIVE 12/19/2017 1451   UROBILINOGEN 0.2 08/24/2011 1641   NITRITE NEGATIVE 12/19/2017 1451   LEUKOCYTESUR NEGATIVE 12/19/2017 1451    Radiological Exams on Admission: Dg Chest Port 1 View  Result Date: 12/19/2017 CLINICAL DATA:  Near syncopal episode EXAM: PORTABLE CHEST 1 VIEW COMPARISON:  08/24/2011 FINDINGS: Cardiomegaly with post CABG change and moderate aortic atherosclerosis. Bibasilar atelectasis. No overt pulmonary edema. Mild emphysematous hyperinflation of the lungs, upper lobe predominant. Resultant crowding of lower lobe interstitial lung markings. No apparent effusion or pneumothorax. The patient's chin obscures portions of the lung apices medially. No acute osseous abnormality. IMPRESSION: 1. Upper lobe predominant emphysematous disease with crowding of lower lobe interstitial lung markings. 2. Bibasilar atelectasis noted. 3. Stable cardiomegaly with aortic atherosclerosis and post CABG change. Electronically Signed   By: Ashley Royalty M.D.   On: 12/19/2017 16:48    EKG: Independently reviewed. Sinus rhythm (heart rate 62) with borderline PR prolongation.  Assessment/Plan Principal Problem:   Near syncope Active Problems:   Essential hypertension, benign   CKD (chronic kidney disease) stage 4, GFR 15-29 ml/min (HCC)   Hyperlipidemia   Gout   Anemia of chronic disease  Near syncope -Orthostatic versus vs cardiac etiology vs possible TIA from hypoperfusion due to history of carotid artery disease.  -Orthostatics positive per EMS report, although unclear why he would be  orthostatic sitting in the car.  In the ED, systolic dropped 31 points when going from lying to sitting position but no drop when going from sitting to standing position. Repeat orthostatics in a.m.  Give maintenance IV fluid. -Carotid Doppler done in October 2018 showing left and right ICA 40 to 59% stenosis. Will repeat dopplers.  -ACS less likely as EKG not suggestive and initial troponin negative.  EKG does show borderline PR prolongation.  Will continue to monitor on telemetry and repeat EKG in a.m. -Order echo -Blood glucose 99 on arrival.  Hypoglycemia less likely to explain his symptoms. -Infection less likely to explain his symptoms as no leukocytosis.  UA and CXR not suggestive of infection.  -Discussed with the daughter.  Although there is possibility of TIA from hypoperfusion due to history of carotid artery disease, patient does not have any focal neuro deficits.  Will hold off ordering a brain MRI at this time.  The tingling in his left forearm happened 2 weeks before as well and has now resolved; possibly ulnar nerve irritation.  Anemia of chronic diseas -Stable. Hemoglobin 10.8.  Baseline 10-12 three years ago.   CKD stage IV -Stable. Creatinine 1.59 (at baseline).  -BMP in am  Gout -Stable.  Continue home allopurinol.  Coronary artery disease status post CABG -Stable.  No chest pain.  Continue home aspirin and metoprolol.  Hypertension -Continue home metoprolol -Hold home lisinopril and hydrochlorothiazide in the setting of ?Orthostatic hypotension  Hyperlipidemia -Continue home Lipitor  DVT prophylaxis: Lovenox Code Status: Patient wishes to be full code. Family Communication: Daughter at bedside updated. Disposition Plan: Anticipate discharge in 1 to 2 days to home. Consults called: None Admission status: Observation   Shela Leff MD [facility] Pager [phone]  If 7PM-7AM, please contact night-coverage [email or web address] Password  [email or web address]  12/19/2017, 9:35 PM

## 2017-12-19 NOTE — ED Triage Notes (Signed)
Pt here via [facility], pt reports a near syncopal episode and dizziness 1.5 hours ago while riding in the car, family drove him home and then called EMS.  Positive orthostatic changes- sitting 126/84, stand 80/60. P 56, 98% RA, CBG 107. A&O x4.

## 2017-12-19 NOTE — ED Notes (Signed)
Pt updated about hospitalist being consulted.

## 2017-12-19 NOTE — ED Notes (Signed)
Per lab, they have received the re-drawn light green and lavender tubes.

## 2017-12-19 NOTE — ED Notes (Signed)
Pt given water 

## 2017-12-20 ENCOUNTER — Observation Stay (HOSPITAL_BASED_OUTPATIENT_CLINIC_OR_DEPARTMENT_OTHER): Payer: Medicare Other

## 2017-12-20 DIAGNOSIS — R55 Syncope and collapse: Secondary | ICD-10-CM | POA: Diagnosis not present

## 2017-12-20 DIAGNOSIS — I340 Nonrheumatic mitral (valve) insufficiency: Secondary | ICD-10-CM | POA: Diagnosis not present

## 2017-12-20 DIAGNOSIS — I10 Essential (primary) hypertension: Secondary | ICD-10-CM

## 2017-12-20 DIAGNOSIS — N184 Chronic kidney disease, stage 4 (severe): Secondary | ICD-10-CM | POA: Diagnosis not present

## 2017-12-20 DIAGNOSIS — I351 Nonrheumatic aortic (valve) insufficiency: Secondary | ICD-10-CM | POA: Diagnosis not present

## 2017-12-20 LAB — BASIC METABOLIC PANEL
ANION GAP: 7 (ref 5–15)
BUN: 23 mg/dL (ref 8–23)
CHLORIDE: 110 mmol/L (ref 98–111)
CO2: 21 mmol/L — ABNORMAL LOW (ref 22–32)
Calcium: 8.9 mg/dL (ref 8.9–10.3)
Creatinine, Ser: 1.41 mg/dL — ABNORMAL HIGH (ref 0.61–1.24)
GFR calc Af Amer: 49 mL/min — ABNORMAL LOW (ref >60)
GFR calc non Af Amer: 43 mL/min — ABNORMAL LOW (ref >60)
GLUCOSE: 103 mg/dL — AB (ref 70–99)
Potassium: 4.7 mmol/L (ref 3.5–5.1)
SODIUM: 138 mmol/L (ref 135–145)

## 2017-12-20 LAB — ECHOCARDIOGRAM COMPLETE
Height: 66 in
WEIGHTICAEL: 2684.32 [oz_av]

## 2017-12-20 MED ORDER — LISINOPRIL 20 MG PO TABS
20.0000 mg | ORAL_TABLET | Freq: Every day | ORAL | Status: DC
  Administered 2017-12-20: 20 mg via ORAL
  Filled 2017-12-20: qty 1

## 2017-12-20 MED ORDER — ONDANSETRON HCL 4 MG/2ML IJ SOLN
4.0000 mg | Freq: Four times a day (QID) | INTRAMUSCULAR | Status: DC | PRN
  Administered 2017-12-20: 4 mg via INTRAVENOUS
  Filled 2017-12-20: qty 2

## 2017-12-20 MED ORDER — DIPHENHYDRAMINE HCL 25 MG PO CAPS
25.0000 mg | ORAL_CAPSULE | Freq: Once | ORAL | Status: AC
  Administered 2017-12-20: 25 mg via ORAL
  Filled 2017-12-20: qty 1

## 2017-12-20 MED ORDER — AMLODIPINE BESYLATE 5 MG PO TABS
5.0000 mg | ORAL_TABLET | Freq: Every day | ORAL | Status: DC
  Administered 2017-12-20 – 2017-12-21 (×2): 5 mg via ORAL
  Filled 2017-12-20 (×2): qty 1

## 2017-12-20 NOTE — Progress Notes (Signed)
Patient has iv in rt A/C

## 2017-12-20 NOTE — Progress Notes (Signed)
  Echocardiogram 2D Echocardiogram has been performed.  Merrie Roof F 12/20/2017, 10:03 AM

## 2017-12-20 NOTE — Care Management Obs Status (Signed)
[place] NOTIFICATION   Patient Details  Name: Danny [name] MRN: [id number] Date of Birth: 11/07/1926   Medicare Observation Status Notification Given:  Yes    Midge Minium RN, BSN, NCM-BC, ACM-RN [phone] 12/20/2017, 4:12 PM

## 2017-12-20 NOTE — Progress Notes (Signed)
Patient ambulated from his room to the elevators and back without incident.

## 2017-12-20 NOTE — Consult Note (Signed)
Cardiology Admission History and Physical:   Patient ID: Danny [name] MRN: [id number]; DOB: [birth year]   Admission date: 12/19/2017  Primary Care Provider: Lavone Orn, MD Primary Cardiologist: Sinclair Grooms, MD / Fletcher Anon    Chief Complaint:  Dizziness, presyncope    Patient Profile:   Danny [name] is a 84 y.o. male with history of CAD, PAD, HTN, CKD  Presents to [facility] with dizziness/presyncope  History of Present Illness:   Danny [name] is an 84 yo who is followed by Linard Millers and Rod Can  Known CAD (s/p CABG  10 years ago); bilateral CEA, CKD, HTN, Bilaterl iliac artery stenting     Pt was in car yesterday as passenger  Felt nauseated, warm.  Vision dimmed.  Lasted 15 min  OK after about 2 hours   O CP   No SOB  Transient tinglin in L arm   Had tingling 2 wks ago.  EMS called   BP sittig 126/84   Standing 80/    Brought to [facility]   The pt does say that he has been getting more winded when he works in yard   Denies CP   No PND      Past Medical History:  Diagnosis Date  . Basal cell carcinoma of nose    "right side; burned it off"  . Carotid artery disease ([place])    a. s/p prior carotid endarterectomy.  . Cerebrovascular disease   . Chronic kidney disease (CKD), stage III (moderate) (HCC)   . Coronary artery disease   . ED (erectile dysfunction)   . History of gout   . Hyperlipidemia   . Hypertension   . Insomnia   . Myocardial infarction ([place]) 2002  . PVD (peripheral vascular disease) ([place])    a. s/p right and left common iliac artery angioplasty with bilateral stent placement.  . Syncope   . Unequal blood pressure in upper extremities     Past Surgical History:  Procedure Laterality Date  . ABDOMINAL HERNIA REPAIR  X 4  . APPENDECTOMY    . CAROTID ENDARTERECTOMY Bilateral   . CORONARY ANGIOPLASTY WITH STENT PLACEMENT     "~ 6 months before bypass"  . CORONARY ARTERY BYPASS GRAFT  2002   "CABG X4"  . HERNIA REPAIR    . PERIPHERAL VASCULAR  CATHETERIZATION N/A 08/05/2014   Procedure: Abdominal Aortogram;  Surgeon: Wellington Hampshire, MD;  Location: [facility] [facility];  Service: Cardiovascular;  Laterality: N/A;  . PERIPHERAL VASCULAR CATHETERIZATION Bilateral 08/05/2014   Procedure: Peripheral Vascular Intervention;  Surgeon: Wellington Hampshire, MD;  Location: [place] [facility];  Service: Cardiovascular;  Laterality: Bilateral;  iliacs     Medications Prior to Admission: Prior to Admission medications   Medication Sig Start Date End Date Taking? Authorizing Provider  allopurinol (ZYLOPRIM) 100 MG tablet Take 1 tablet by mouth at bedtime.  06/05/16  Yes [provider]  amLODipine (NORVASC) 10 MG tablet Take 5 mg by mouth at bedtime.    Yes [provider]  aspirin EC 81 MG tablet Take 81 mg by mouth daily.   Yes [provider]  atorvastatin (LIPITOR) 40 MG tablet Take 40 mg by mouth daily at 6 PM.    Yes [provider]  cyanocobalamin 100 MCG tablet Take 100 mcg by mouth daily.   Yes [provider]  hydrochlorothiazide (MICROZIDE) 12.5 MG capsule Take 1 capsule (12.5 mg total) by mouth daily. 07/13/17  Yes Belva Crome, MD  lisinopril (PRINIVIL,ZESTRIL) 20 MG tablet Take 20 mg by mouth at bedtime.    Yes [provider]  metoprolol tartrate (LOPRESSOR) 25 MG tablet TAKE 1 TABLET BY MOUTH TWICE A DAY Patient taking differently: Take 25 mg by mouth 2 (two) times daily.  08/21/17  Yes Belva Crome, MD  nitroGLYCERIN (NITROSTAT) 0.4 MG SL tablet Place 0.4 mg under the tongue every 5 (five) minutes as needed for chest pain.    Yes [provider]     Allergies:    Allergies  Allergen Reactions  . Clindamycin/Lincomycin Other (See Comments)    Pt states caused c-diff and hospitalization    Social History:   Social History   Socioeconomic History  . Marital status: Widowed    Spouse name: Not on file  . Number of children: Not on file  . Years of education: Not on  file  . Highest education level: Not on file  Occupational History  . Not on file  Social Needs  . Financial resource strain: Not on file  . Food insecurity:    Worry: Not on file    Inability: Not on file  . Transportation needs:    Medical: Not on file    Non-medical: Not on file  Tobacco Use  . Smoking status: Former Smoker    Packs/day: 1.00    Years: 56.00    Pack years: 56.00    Types: Cigarettes  . Smokeless tobacco: Never Used  . Tobacco comment: "stopped smoking when I had MI in 2002"  Substance and Sexual Activity  . Alcohol use: No  . Drug use: No  . Sexual activity: Not on file  Lifestyle  . Physical activity:    Days per week: Not on file    Minutes per session: Not on file  . Stress: Not on file  Relationships  . Social connections:    Talks on phone: Not on file    Gets together: Not on file    Attends religious service: Not on file    Active member of club or organization: Not on file    Attends meetings of clubs or organizations: Not on file    Relationship status: Not on file  . Intimate partner violence:    Fear of current or ex partner: Not on file    Emotionally abused: Not on file    Physically abused: Not on file    Forced sexual activity: Not on file  Other Topics Concern  . Not on file  Social History Narrative  . Not on file    Family History:   The patient's family history includes Heart failure in his father and mother. There is no history of Heart attack or Hypertension.    ROS:  Please see the history of present illness.  All systems reviewed  Neg except as noted above     Physical Exam/Data:   Vitals:   12/20/17 0043 12/20/17 0511 12/20/17 1114 12/20/17 1215  BP: 136/66 (!) 148/48 (!) 148/58 (!) 73/48  Pulse: 65 64 67 68  Resp: 16 16 16    Temp: 97.9 F (36.6 C) 97.8 F (36.6 C) 98 F (36.7 C)   TempSrc: Oral Oral Oral   SpO2: 95% 97% 100%   Weight:      Height:        Intake/Output Summary (Last 24 hours) at  12/20/2017 1658 Last data filed at 12/20/2017 0800 Gross per 24 hour  Intake 1167.37  ml  Output 675 ml  Net 492.37 ml   Filed Weights   12/19/17 1433 12/19/17 2143  Weight: 75.8 kg 76.1 kg   Body mass index is 27.08 kg/m.  General:  Well nourished, well developed, in no acute distress HEENT: normal Lymph: no adenopathy Neck: L carotid brut Cardiac:  normal S1, S2; RRR; no siginficant murmurs   Lungs:  Mild rales at bases   Moving air   Abd: soft, nontender, no hepatomegaly  Ext: no signif edema  2+ DP pulses   Musculoskeletal:  No deformities  Moving all extremities Skin: warm and dry  Neuro:  CNs 2-12 intact, no focal abnormalities noted Psych:  Normal affect    EKG:  The ECG that was done 10/9 was personally reviewed and demonstrates SR 62 bpm   PR interval 200 msec   LVH with nonspecific ST changes    Relevant CV Studies:  ------------------------------------------------------------------- LV EF: 60% -   65%  ------------------------------------------------------------------- Indications:      Syncope 780.2.  ------------------------------------------------------------------- History:   PMH:  Anemia. CKD stage IV.  ------------------------------------------------------------------- Study Conclusions  - Left ventricle: The cavity size was normal. There was moderate   concentric hypertrophy. Systolic function was normal. The   estimated ejection fraction was in the range of 60% to 65%. Wall   motion was normal; there were no regional wall motion   abnormalities. Doppler parameters are consistent with abnormal   left ventricular relaxation (grade 1 diastolic dysfunction). - Aortic valve: Trileaflet; mildly thickened, moderately calcified   leaflets. - Mitral valve: There was moderate regurgitation. - Left atrium: The atrium was mildly dilated. - Tricuspid valve: There was mild regurgitation. - Pulmonary arteries: Systolic pressure was mildly  increased.  ------------------------------------------------------------------- Study data:  No prior study was available for comparison.  Study status:  Routine.  Procedure:  The patient reported no pain pre or post test. Transthoracic echocardiography. Image quality was adequate.  Study completion:  There were no complications. Transthoracic echocardiography.  M-mode, complete 2D, spectral Doppler, and color Doppler.  Birthdate:  Patient birthdate: 1926/10/29.  Age:  Patient is 84 yr old.  Sex:  Gender: male. BMI: 27.1 kg/m^2.  Blood pressure:     148/48  Patient status: Inpatient.  Study date:  Study date: 12/20/2017. Study time: 08:52 AM.  Location:  Echo laboratory.  -------------------------------------------------------------------  ------------------------------------------------------------------- Left ventricle:  The cavity size was normal. There was moderate concentric hypertrophy. Systolic function was normal. The estimated ejection fraction was in the range of 60% to 65%. Wall motion was normal; there were no regional wall motion abnormalities. Doppler parameters are consistent with abnormal left ventricular relaxation (grade 1 diastolic dysfunction).  ------------------------------------------------------------------- Aortic valve:   Trileaflet; mildly thickened, moderately calcified leaflets. Mobility was not restricted.  Doppler:  Transvalvular velocity was within the normal range. There was no stenosis. There was no regurgitation.  ------------------------------------------------------------------- Aorta:  Aortic root: The aortic root was normal in size.  ------------------------------------------------------------------- Mitral valve:   Structurally normal valve.   Mobility was not restricted.  Doppler:  Transvalvular velocity was within the normal range. There was no evidence for stenosis. There was moderate regurgitation.    Valve area by pressure  half-time: 4.23 cm^2. Indexed valve area by pressure half-time: 2.23 cm^2/m^2.    Peak gradient (D): 5 mm Hg.  ------------------------------------------------------------------- Left atrium:  The atrium was mildly dilated.  ------------------------------------------------------------------- Right ventricle:  The cavity size was normal. Wall thickness was normal. Systolic function was normal.  ------------------------------------------------------------------- Pulmonic valve:  Poorly visualized.  Structurally normal valve. Cusp separation was normal.  Doppler:  Transvalvular velocity was within the normal range. There was no evidence for stenosis. There was mild regurgitation.  ------------------------------------------------------------------- Tricuspid valve:   Structurally normal valve.    Doppler: Transvalvular velocity was within the normal range. There was mild regurgitation.  ------------------------------------------------------------------- Pulmonary artery:   The main pulmonary artery was normal-sized. Systolic pressure was mildly increased.  ------------------------------------------------------------------- Right atrium:  The atrium was normal in size.  ------------------------------------------------------------------- Pericardium:  There was no pericardial effusion.  ------------------------------------------------------------------- Systemic veins: Inferior vena cava: The vessel was normal in size.  ------------------------------------------------------------------- Measurements   Left ventricle                          Value          Reference  LV ID, ED, PLAX chordal         (L)     31    mm       43 - 52  LV ID, ES, PLAX chordal         (L)     22    mm       23 - 38  LV fx shortening, PLAX chordal          29    %        >=29  LV PW thickness, ED                     12    mm       ----------  IVS/LV PW ratio, ED                     1.25            <=1.3  Stroke volume, 2D                       71    ml       ----------  Stroke volume/bsa, 2D                   37    ml/m^2   ----------  LV e&', lateral                          10    cm/s     ----------  LV E/e&', lateral                        10.7           ----------  LV e&', medial                           8.05  cm/s     ----------  LV E/e&', medial                         13.29          ----------  LV e&', average                          9.03  cm/s     ----------  LV E/e&', average  11.86          ----------    Ventricular septum                      Value          Reference  IVS thickness, ED                       15    mm       ----------    LVOT                                    Value          Reference  LVOT ID, S                              21    mm       ----------  LVOT area                               3.46  cm^2     ----------  LVOT peak velocity, S                   86.8  cm/s     ----------  LVOT mean velocity, S                   54.3  cm/s     ----------  LVOT VTI, S                             20.6  cm       ----------    Aorta                                   Value          Reference  Aortic root ID, ED                      32    mm       ----------    Left atrium                             Value          Reference  LA ID, A-P, ES                          43    mm       ----------  LA ID/bsa, A-P                  (H)     2.26  cm/m^2   <=2.2  LA volume, S                            67.5  ml       ----------  LA volume/bsa, S                        35.5  ml/m^2   ----------  LA volume, ES, 1-p A4C                  56.1  ml       ----------  LA volume/bsa, ES, 1-p A4C              29.5  ml/m^2   ----------  LA volume, ES, 1-p A2C                  79.2  ml       ----------  LA volume/bsa, ES, 1-p A2C              41.7  ml/m^2   ----------    Mitral valve                            Value          Reference  Mitral E-wave peak  velocity             107   cm/s     ----------  Mitral A-wave peak velocity             108   cm/s     ----------  Mitral deceleration time                177   ms       150 - 230  Mitral pressure half-time               52    ms       ----------  Mitral peak gradient, D                 5     mm Hg    ----------  Mitral E/A ratio, peak                  1              ----------  Mitral valve area, PHT, DP              4.23  cm^2     ----------  Mitral valve area/bsa, PHT, DP          2.23  cm^2/m^2 ----------    Tricuspid valve                         Value          Reference  Tricuspid regurg peak velocity          308   cm/s     ----------  Tricuspid peak RV-RA gradient           38    mm Hg    ----------    Right atrium                            Value          Reference  RA ID, S-I, ES, A4C             (H)     66.1  mm       34 - 49  RA area, ES, A4C                (H)     19.9  cm^2     8.3 - 19.5  RA volume, ES, A/L  51.5  ml       ----------  RA volume/bsa, ES, A/L                  27.1  ml/m^2   ----------    Right ventricle                         Value          Reference  RV ID, minor axis, ED, A4C base         37    mm       ----------  TAPSE                                   20.3  mm       ----------  RV s&', lateral, S                       10.6  cm/s     ----------  Legend: (L)  and  (H)  mark values outside specified reference range.  ------------------------------------------------------------------- Prepared and Electronically Authenticated by  Candee Furbish, M.D. 2019-10-10T13:43:18   CAROTID ARTERY USN Final report pending  Laboratory Data:  Chemistry Recent Labs  Lab 12/19/17 1839 12/20/17 0452  NA 137 138  K 5.1 4.7  CL 108 110  CO2 21* 21*  GLUCOSE 99 103*  BUN 28* 23  CREATININE 1.59* 1.41*  CALCIUM 9.2 8.9  GFRNONAA 37* 43*  GFRAA 43* 49*  ANIONGAP 8 7    No results for input(s): PROT, ALBUMIN, AST, ALT, ALKPHOS,  BILITOT in the last 168 hours. Hematology Recent Labs  Lab 12/19/17 1839  WBC 9.6  RBC 3.17*  HGB 10.8*  HCT 32.9*  MCV 103.8*  MCH 34.1*  MCHC 32.8  RDW 13.5  PLT 157   Cardiac Enzymes Recent Labs  Lab 12/19/17 1841  TROPONINI <0.03   No results for input(s): TROPIPOC in the last 168 hours.  BNP Recent Labs  Lab 12/19/17 1840  BNP 322.2*    DDimer No results for input(s): DDIMER in the last 168 hours.  Radiology/Studies:  No results found.  Assessment and Plan:   1  Presyncope    Pt with spell of presyncope   I do not think arrhythmic in origin   He was orthostatic on evaluation Pt claims he has been eating/drinking enough each day   U.A. Did have SG of 1.014, mildly concentrated.   Has been hydrated since  Recomm:   Repeat orthostatic evaluation  2   Dyspnea.   Could be explained by labile BP    Pt with known CAD  and remote CABG  Currently, pt with some mild volume increase on exam Follow    3  CV dz     Carotid USN shows possible severe dz in L ICA   L vertebral with retrograde flow but R vertebral antegrade     This does not explain dizziness. Final results pending   4   CAD   Remote CABG      Signed, Dorris Carnes, MD  12/20/2017 4:58 PM

## 2017-12-20 NOTE — Progress Notes (Signed)
Progress Note    TRINITY HYLAND  VZD:[id number] DOB: Jul 16, 1926  DOA: 12/19/2017 PCP: Lavone Orn, MD    Brief Narrative:     Medical records reviewed and are as summarized below:  GOR VESTAL is a 84 y.o. male with medical history significant of CKD stage IV, coronary artery disease status post CABG, hypertension, hyperlipidemia, peripheral vascular disease, carotid artery disease status post prior carotid endarterectomy presenting to the hospital via EMS for further evaluation of near syncope.  Patient states he was a passenger in a car today and all of a sudden felt dizziness and vision turned dark.  This episode lasted about 15 to 20 minutes.  States he went back to feeling completely normal after 2 hours.  Denies having any vomiting or loss of consciousness.  Denies having any chest pain or shortness of breath.  States his left forearm (below elbow to above the wrist) was tingling when this happened and the tingling also resolved in 15 to 20 minutes.  Reports having similar tingling sensation in the same area about 2 weeks ago.  He did not have slurred speech or any focal weakness.  Denies prior history of seizures.  Per daughter at bedside, the other family member driving the patient did not notice any tongue biting, body shaking, or urinary/fecal incontinence.  Patient states his oral intake has been good; daughter confirmed.  Assessment/Plan:   Principal Problem:   Near syncope Active Problems:   Essential hypertension, benign   CKD (chronic kidney disease) stage 4, GFR 15-29 ml/min (HCC)   Hyperlipidemia   Gout   Anemia of chronic disease  Near syncope -had a few episodes in the past -? Subclavian steel syndrome -- consult [facility] for other vascular needs -Orthostatics positive per EMS report (think they checked on the left side), although unclear why he would be orthostatic sitting in the car.  He was looking straight ahead.  In the ED, systolic dropped 31 points when  going from lying to sitting position but no drop when going from sitting to standing position.  -repeat orthostatics on same side are negative -Carotid Doppler done in October 2018 showing left and right ICA 40 to 59% stenosis. W -repeat dopplers show:Left internal carotid artery peak systolic velocities suggest 80-99% stenosis, however end diastolic velocities and plaque morphology is suggestive of 1-39% stenosis. Elevated peak systolic velocities may be secondary to possible proximal stenosis. Left vertebral artery is patent with retrograde flow. Left subclavian artery is patent with antegrade flow, however waveforms are monophasic. These findings are suggestive of a possible proximal subclavian artery stenosis.  - echo pending -ACS less likely as EKG not suggestive and initial troponin negative.  EKG does show borderline PR prolongation.  Will continue to monitor on telemetry and repeat EKG in a.m. -Blood glucose 99 on arrival.  Hypoglycemia less likely to explain his symptoms. -Infection less likely to explain his symptoms as no leukocytosis.  UA and CXR not suggestive of infection.    Anemia of chronic diseas -Stable. Hemoglobin 10.8.  Baseline 10-12 three years ago.   CKD stage IV -Stable. Creatinine 1.59 (at baseline).    Gout -Stable.  Continue home allopurinol.  Coronary artery disease status post CABG -Stable.  No chest pain.  Continue home aspirin and metoprolol.  Hypertension -BP in each arm is markedly different- left 73/48; right 148/58  Hyperlipidemia -Continue home Lipitor    Family Communication/Anticipated D/C date and plan/Code Status   DVT prophylaxis: Lovenox ordered. Code Status:  Full Code.  Family Communication: daughter Disposition Plan:    Medical Consultants:    Ar[facility] (vascular)  Subjective:   Yesterday was the 3rd episode  Objective:    Vitals:   12/20/17 0043 12/20/17 0511 12/20/17 1114 12/20/17 1215  BP: 136/66 (!) 148/48 (!)  148/58 (!) 73/48  Pulse: 65 64 67 68  Resp: 16 16 16    Temp: 97.9 F (36.6 C) 97.8 F (36.6 C) 98 F (36.7 C)   TempSrc: Oral Oral Oral   SpO2: 95% 97% 100%   Weight:      Height:        Intake/Output Summary (Last 24 hours) at 12/20/2017 1253 Last data filed at 12/20/2017 0800 Gross per 24 hour  Intake 1167.37 ml  Output 675 ml  Net 492.37 ml   Filed Weights   12/19/17 1433 12/19/17 2143  Weight: 75.8 kg 76.1 kg    Exam: In bed, NAD- family at bedside Radial pulse decreased on left side No increased work of breathing Alert and oriented x 3  Data Reviewed:   I have personally reviewed following labs and imaging studies:  Labs: Labs show the following:   Basic Metabolic Panel: Recent Labs  Lab 12/19/17 1839 12/20/17 0452  NA 137 138  K 5.1 4.7  CL 108 110  CO2 21* 21*  GLUCOSE 99 103*  BUN 28* 23  CREATININE 1.59* 1.41*  CALCIUM 9.2 8.9   GFR Estimated Creatinine Clearance: 32.1 mL/min (A) (by C-G formula based on SCr of 1.41 mg/dL (H)). Liver Function Tests: No results for input(s): AST, ALT, ALKPHOS, BILITOT, PROT, ALBUMIN in the last 168 hours. No results for input(s): LIPASE, AMYLASE in the last 168 hours. No results for input(s): AMMONIA in the last 168 hours. Coagulation profile No results for input(s): INR, PROTIME in the last 168 hours.  CBC: Recent Labs  Lab 12/19/17 1839  WBC 9.6  HGB 10.8*  HCT 32.9*  MCV 103.8*  PLT 157   Cardiac Enzymes: Recent Labs  Lab 12/19/17 1841  TROPONINI <0.03   BNP (last 3 results) No results for input(s): PROBNP in the last 8760 hours. CBG: No results for input(s): GLUCAP in the last 168 hours. D-Dimer: No results for input(s): DDIMER in the last 72 hours. Hgb A1c: No results for input(s): HGBA1C in the last 72 hours. Lipid Profile: No results for input(s): CHOL, HDL, LDLCALC, TRIG, CHOLHDL, LDLDIRECT in the last 72 hours. Thyroid function studies: No results for input(s): TSH, T4TOTAL,  T3FREE, THYROIDAB in the last 72 hours.  Invalid input(s): FREET3 Anemia work up: No results for input(s): VITAMINB12, FOLATE, FERRITIN, TIBC, IRON, RETICCTPCT in the last 72 hours. Sepsis Labs: Recent Labs  Lab 12/19/17 1839  WBC 9.6    Microbiology No results found for this or any previous visit (from the past 240 hour(s)).  Procedures and diagnostic studies:  Dg Chest Port 1 View  Result Date: 12/19/2017 CLINICAL DATA:  Near syncopal episode EXAM: PORTABLE CHEST 1 VIEW COMPARISON:  08/24/2011 FINDINGS: Cardiomegaly with post CABG change and moderate aortic atherosclerosis. Bibasilar atelectasis. No overt pulmonary edema. Mild emphysematous hyperinflation of the lungs, upper lobe predominant. Resultant crowding of lower lobe interstitial lung markings. No apparent effusion or pneumothorax. The patient's chin obscures portions of the lung apices medially. No acute osseous abnormality. IMPRESSION: 1. Upper lobe predominant emphysematous disease with crowding of lower lobe interstitial lung markings. 2. Bibasilar atelectasis noted. 3. Stable cardiomegaly with aortic atherosclerosis and post CABG change. Electronically Signed  By: Ashley Royalty M.D.   On: 12/19/2017 16:48    Medications:   . allopurinol  100 mg Oral QHS  . aspirin EC  81 mg Oral Daily  . atorvastatin  40 mg Oral q1800  . enoxaparin (LOVENOX) injection  30 mg Subcutaneous Q24H  . metoprolol tartrate  25 mg Oral BID  . cyanocobalamin  100 mcg Oral Daily   Continuous Infusions:   LOS: 0 days   Geradine Girt  [facility]   *Please refer to [email or web address], password [email or web address] to get updated schedule on who will round on this patient, as hospitalists switch teams weekly. If 7PM-7AM, please contact night-coverage at [email or web address], password [email or web address] for any overnight needs.  12/20/2017, 12:53 PM

## 2017-12-20 NOTE — Progress Notes (Signed)
*  PRELIMINARY RESULTS* Vascular Ultrasound Carotid Duplex (Doppler) has been completed.   Right internal carotid artery velocities suggest 1-39% internal carotid artery stenosis, with presence of calcific plaque.  Right vertebral artery is patent with antegrade flow.  Left internal carotid artery peak systolic velocities suggest 80-99% stenosis, however end diastolic velocities and plaque morphology is suggestive of 1-39% stenosis. Elevated peak systolic velocities may be secondary to possible proximal stenosis.  Left vertebral artery is patent with retrograde flow. Left subclavian artery is patent with antegrade flow, however waveforms are monophasic. These findings are suggestive of a possible proximal subclavian artery stenosis.   12/20/2017 10:43 AM Maudry Mayhew, MHA, RVT, RDCS, RDMS

## 2017-12-21 ENCOUNTER — Telehealth: Payer: Self-pay | Admitting: Cardiovascular Disease

## 2017-12-21 DIAGNOSIS — I10 Essential (primary) hypertension: Secondary | ICD-10-CM | POA: Diagnosis not present

## 2017-12-21 DIAGNOSIS — R55 Syncope and collapse: Secondary | ICD-10-CM | POA: Diagnosis not present

## 2017-12-21 DIAGNOSIS — N184 Chronic kidney disease, stage 4 (severe): Secondary | ICD-10-CM | POA: Diagnosis not present

## 2017-12-21 MED ORDER — AMLODIPINE BESYLATE 2.5 MG PO TABS: 2.5000 mg | ORAL_TABLET | Freq: Every day | ORAL | 0 refills | Status: DC

## 2017-12-21 MED ORDER — AMLODIPINE BESYLATE 2.5 MG PO TABS: 2.5000 mg | ORAL_TABLET | Freq: Every day | ORAL | Status: DC

## 2017-12-21 MED ORDER — ENOXAPARIN SODIUM 40 MG/0.4ML ~~LOC~~ SOLN: 40.0000 mg | SUBCUTANEOUS | Status: DC

## 2017-12-21 NOTE — Progress Notes (Signed)
Pt going home with daughter [name])

## 2017-12-21 NOTE — Telephone Encounter (Signed)
New Message:     Patient ha questions about his appt 11/1/9, requesting a call back

## 2017-12-21 NOTE — Progress Notes (Signed)
Progress Note  Patient Name: Danny [name] Date of Encounter: 12/21/2017  Primary Cardiologist: Sinclair Grooms, MD / Fletcher Anon  Subjective  Pt had mild dizziness last night   Not today after walking   Inpatient Medications    Scheduled Meds: . allopurinol  100 mg Oral QHS  . amLODipine  5 mg Oral QHS  . aspirin EC  81 mg Oral Daily  . atorvastatin  40 mg Oral q1800  . enoxaparin (LOVENOX) injection  30 mg Subcutaneous Q24H  . lisinopril  20 mg Oral QHS  . metoprolol tartrate  25 mg Oral BID  . cyanocobalamin  100 mcg Oral Daily   Continuous Infusions:  PRN Meds: ondansetron (ZOFRAN) IV   Vital Signs    Vitals:   12/20/17 1215 12/20/17 1750 12/20/17 2319 12/21/17 0531  BP: (!) 73/48 (!) 119/49 (!) 134/50 (!) 146/55  Pulse: 68 71 62 69  Resp:  16 16 16   Temp:  98.6 F (37 C) 98.6 F (37 C) (!) 97.5 F (36.4 C)  TempSrc:  Oral Oral Oral  SpO2:  96% 95% 97%  Weight:      Height:        Intake/Output Summary (Last 24 hours) at 12/21/2017 0945 Last data filed at 12/20/2017 1800 Gross per 24 hour  Intake 111 ml  Output -  Net 111 ml   Filed Weights   12/19/17 1433 12/19/17 2143  Weight: 75.8 kg 76.1 kg    Telemetry     SB/ SR   No signif pauses  Average HR 60   Personally Reviewed  ECG      Physical Exam   GEN: No acute distress.   Neck: No JVD Cardiac: RRR, no murmurs, rubs, or gallops.  Respiratory: Clear to auscultation bilaterally. GI: Soft, nontender, non-distended  MS: No edema; No deformity. Neuro:  Nonfocal  Psych: Normal affect   Labs    Chemistry Recent Labs  Lab 12/19/17 1839 12/20/17 0452  NA 137 138  K 5.1 4.7  CL 108 110  CO2 21* 21*  GLUCOSE 99 103*  BUN 28* 23  CREATININE 1.59* 1.41*  CALCIUM 9.2 8.9  GFRNONAA 37* 43*  GFRAA 43* 49*  ANIONGAP 8 7     Hematology Recent Labs  Lab 12/19/17 1839  WBC 9.6  RBC 3.17*  HGB 10.8*  HCT 32.9*  MCV 103.8*  MCH 34.1*  MCHC 32.8  RDW 13.5  PLT 157    Cardiac  Enzymes Recent Labs  Lab 12/19/17 1841  TROPONINI <0.03   No results for input(s): TROPIPOC in the last 168 hours.   BNP Recent Labs  Lab 12/19/17 1840  BNP 322.2*     DDimer No results for input(s): DDIMER in the last 168 hours.   Radiology    Dg Chest Port 1 View  Result Date: 12/19/2017 CLINICAL DATA:  Near syncopal episode EXAM: PORTABLE CHEST 1 VIEW COMPARISON:  08/24/2011 FINDINGS: Cardiomegaly with post CABG change and moderate aortic atherosclerosis. Bibasilar atelectasis. No overt pulmonary edema. Mild emphysematous hyperinflation of the lungs, upper lobe predominant. Resultant crowding of lower lobe interstitial lung markings. No apparent effusion or pneumothorax. The patient's chin obscures portions of the lung apices medially. No acute osseous abnormality. IMPRESSION: 1. Upper lobe predominant emphysematous disease with crowding of lower lobe interstitial lung markings. 2. Bibasilar atelectasis noted. 3. Stable cardiomegaly with aortic atherosclerosis and post CABG change. Electronically Signed   By: Ashley Royalty M.D.   On: 12/19/2017 16:48  Cardiac Studies     Patient Profile     84 y.o. male hx of CAD, PAD, HTN and CK   Presented to [facility] with near syncope    Assessment & Plan     1  Presyncope   Pt with mild dizziness last night    NOne today    Orthostatics last night   Systolic Stable   Diastolic decreased 10 points  HR stable (70s) I have reviewed carotid USN with M Arida  DO not think this is related to his dizziness His HR is on lower sider (more while sleeping)   No pauses however I have pulled back on amlodipine to 2.5 mg daily     COntinue other meds for now Encouraged hydration (urine was a little concentrated on admit)  WIll need outpt f/u   Sees Dr Laurann Montana   TOld them to get appt in the next coupel weeks  I will try to contact him  For questions or updates, please contact [facility] Please consult [email or web address] for contact info under          Signed, Dorris Carnes, MD  12/21/2017, 9:45 AM

## 2017-12-21 NOTE — Telephone Encounter (Signed)
Spoke with pt daughter Leander Rams on file. Pt had a carotid duplex on 12/20/17 in the hospital.  [name] rqst that we cancel the carotid duplex scheduled for 01/11/18. Adv her that I will cancel, and fwd the update to [place]

## 2017-12-21 NOTE — Discharge Summary (Signed)
Physician Discharge Summary  Danny [name] DJM:[id number] DOB: 26-Sep-1926 DOA: 12/19/2017  PCP: Lavone Orn, MD  Admit date: 12/19/2017 Discharge date: 12/21/2017  Admitted From: home Discharge disposition: home   Recommendations for Outpatient Follow-Up:   Stay hydrated Close follow up with Dr. Fletcher Anon norvasc dose decreased  Discharge Diagnosis:   Principal Problem:   Near syncope Active Problems:   Essential hypertension, benign   CKD (chronic kidney disease) stage 4, GFR 15-29 ml/min (HCC)   Hyperlipidemia   Gout   Anemia of chronic disease    Discharge Condition: Improved.  Diet recommendation: Low sodium, heart healthy  Wound care: None.  Code status: Full.   History of Present Illness:   Danny Lederman Mooreis a 84 y.o.malewith medical history significant ofCKD stageIV, coronary artery disease status post CABG, hypertension, hyperlipidemia, peripheral vascular disease, carotid artery disease status post prior carotid endarterectomy presenting to the hospital via EMS for further evaluation of near syncope.Patient states he was a passenger in a car today and all of a sudden felt dizziness and vision turned dark. This episode lasted about 15 to 20 minutes. States he went back to feeling completely normal after 2 hours. Denies having any vomiting or loss of consciousness. Denies having any chest pain or shortness of breath. States his left forearm (below elbow to above the wrist)was tingling when this happened and the tingling also resolved in 15 to 20 minutes. Reports having similar tingling sensation in the same area about 2 weeks ago. He did not have slurred speech or any focal weakness. Denies prior history of seizures. Per daughter at bedside, the other family member driving the patient did not notice any tongue biting, body shaking, or urinary/fecal incontinence. Patient states his oral intake has been good;daughter confirmed.   Hospital  Course by Problem:   Near syncope -had a few episodes in the past -Orthostatics positive per EMS report,although unclear why he would be orthostatic sitting in the car. He was looking straight ahead.  In the ED, systolicdropped 31 pointswhen going from lying to sitting positionbutno drop when going from sitting to standing position.  -repeat orthostatics on right side are negative -Carotid Doppler done in October 2018 showing leftand rightICA 40 to 59% stenosis. W -repeat dopplers show:Left internal carotid artery peak systolic velocities suggest 80-99% stenosis, however end diastolic velocities and plaque morphology is suggestive of 1-39% stenosis. Elevated peak systolic velocities may be secondary to possible proximal stenosis. Left vertebral artery is patent with retrogradeflow. Left subclavian artery is patent with antegrade flow, however waveforms are monophasic. These findings are suggestive of a possible proximal subclavian artery stenosis.  - echo normal -ACS less likely as EKG not suggestiveandinitial troponin negative.EKG does show borderline PR prolongation. Will continue to monitor on telemetry and repeat EKG in a.m. -Blood glucose 99 on arrival.Hypoglycemia less likely to explain his symptoms. -Infection less likely to explain his symptoms asnoleukocytosis. UA and CXRnot suggestive of infection. -?SSS- was seen by Cardiology who opined:  Presyncope  Pt with mild dizziness last night    NOne today    Orthostatics last night   Systolic Stable   Diastolic decreased 10 points  HR stable (70s) I have reviewed carotid USN with M Arida  DO not think this is related to his dizziness His HR is on lower sider (more while sleeping)   No pauses however I have pulled back on amlodipine to 2.5 mg daily     COntinue other meds for now Encouraged hydration (  urine was a little concentrated on admit)  Anemia of chronic diseas -Stable.Hemoglobin 10.8. Baseline 10-12  threeyears ago.   CKD stageIV -Stable.Creatinine 1.59 (at baseline).   Gout -Stable. Continue home allopurinol.  Coronary artery disease status post CABG -Stable. No chest pain. Continue home aspirin andmetoprolol.  Hypertension -BP in each arm is markedly different- left 73/48; right 148/58  Hyperlipidemia -Continue home Lipitor    Medical Consultants:   cards   Discharge Exam:   Vitals:   12/21/17 0955 12/21/17 1231  BP: (!) 153/59 (!) 159/56  Pulse: 77 62  Resp:  16  Temp:  98.7 F (37.1 C)  SpO2:  97%   Vitals:   12/20/17 2319 12/21/17 0531 12/21/17 0955 12/21/17 1231  BP: (!) 134/50 (!) 146/55 (!) 153/59 (!) 159/56  Pulse: 62 69 77 62  Resp: 16 16  16   Temp: 98.6 F (37 C) (!) 97.5 F (36.4 C)  98.7 F (37.1 C)  TempSrc: Oral Oral  Oral  SpO2: 95% 97%  97%  Weight:      Height:        General exam: Appears calm and comfortable.    The results of significant diagnostics from this hospitalization (including imaging, microbiology, ancillary and laboratory) are listed below for reference.     Procedures and Diagnostic Studies:   Dg Chest Port 1 View  Result Date: 12/19/2017 CLINICAL DATA:  Near syncopal episode EXAM: PORTABLE CHEST 1 VIEW COMPARISON:  08/24/2011 FINDINGS: Cardiomegaly with post CABG change and moderate aortic atherosclerosis. Bibasilar atelectasis. No overt pulmonary edema. Mild emphysematous hyperinflation of the lungs, upper lobe predominant. Resultant crowding of lower lobe interstitial lung markings. No apparent effusion or pneumothorax. The patient's chin obscures portions of the lung apices medially. No acute osseous abnormality. IMPRESSION: 1. Upper lobe predominant emphysematous disease with crowding of lower lobe interstitial lung markings. 2. Bibasilar atelectasis noted. 3. Stable cardiomegaly with aortic atherosclerosis and post CABG change. Electronically Signed   By: Ashley Royalty M.D.   On: 12/19/2017 16:48      Labs:   Basic Metabolic Panel: Recent Labs  Lab 12/19/17 1839 12/20/17 0452  NA 137 138  K 5.1 4.7  CL 108 110  CO2 21* 21*  GLUCOSE 99 103*  BUN 28* 23  CREATININE 1.59* 1.41*  CALCIUM 9.2 8.9   GFR Estimated Creatinine Clearance: 32.1 mL/min (A) (by C-G formula based on SCr of 1.41 mg/dL (H)). Liver Function Tests: No results for input(s): AST, ALT, ALKPHOS, BILITOT, PROT, ALBUMIN in the last 168 hours. No results for input(s): LIPASE, AMYLASE in the last 168 hours. No results for input(s): AMMONIA in the last 168 hours. Coagulation profile No results for input(s): INR, PROTIME in the last 168 hours.  CBC: Recent Labs  Lab 12/19/17 1839  WBC 9.6  HGB 10.8*  HCT 32.9*  MCV 103.8*  PLT 157   Cardiac Enzymes: Recent Labs  Lab 12/19/17 1841  TROPONINI <0.03   BNP: Invalid input(s): POCBNP CBG: No results for input(s): GLUCAP in the last 168 hours. D-Dimer No results for input(s): DDIMER in the last 72 hours. Hgb A1c No results for input(s): HGBA1C in the last 72 hours. Lipid Profile No results for input(s): CHOL, HDL, LDLCALC, TRIG, CHOLHDL, LDLDIRECT in the last 72 hours. Thyroid function studies No results for input(s): TSH, T4TOTAL, T3FREE, THYROIDAB in the last 72 hours.  Invalid input(s): FREET3 Anemia work up No results for input(s): VITAMINB12, FOLATE, FERRITIN, TIBC, IRON, RETICCTPCT in the last 72 hours.  Microbiology No results found for this or any previous visit (from the past 240 hour(s)).   Discharge Instructions:   Discharge Instructions    Diet - low sodium heart healthy   Complete by:  As directed    Discharge instructions   Complete by:  As directed    Be sure to stay hydrated   Increase activity slowly   Complete by:  As directed      Allergies as of 12/21/2017      Reactions   Clindamycin/lincomycin Other (See Comments)   Pt states caused c-diff and hospitalization      Medication List    STOP taking these  medications   hydrochlorothiazide 12.5 MG capsule Commonly known as:  MICROZIDE     TAKE these medications   allopurinol 100 MG tablet Commonly known as:  ZYLOPRIM Take 1 tablet by mouth at bedtime.   amLODipine 2.5 MG tablet Commonly known as:  NORVASC Take 1 tablet (2.5 mg total) by mouth at bedtime. Start taking on:  12/22/2017 What changed:    medication strength  how much to take   aspirin EC 81 MG tablet Take 81 mg by mouth daily.   atorvastatin 40 MG tablet Commonly known as:  LIPITOR Take 40 mg by mouth daily at 6 PM.   cyanocobalamin 100 MCG tablet Take 100 mcg by mouth daily.   lisinopril 20 MG tablet Commonly known as:  PRINIVIL,ZESTRIL Take 20 mg by mouth at bedtime.   metoprolol tartrate 25 MG tablet Commonly known as:  LOPRESSOR TAKE 1 TABLET BY MOUTH TWICE A DAY   nitroGLYCERIN 0.4 MG SL tablet Commonly known as:  NITROSTAT Place 0.4 mg under the tongue every 5 (five) minutes as needed for chest pain.      Follow-up Information    Lavone Orn, MD Follow up in 1 week(s).   Specialty:  Internal Medicine Contact information: [address] [place] [address] [phone]        Belva Crome, MD .   Specialty:  Cardiology Contact information: [phone] [address] [place] [address] [phone]        Wellington Hampshire, MD Follow up.   Specialty:  Cardiology Contact information: [address] [place] [address] [phone]            Time coordinating discharge: 35 min  Signed:  Geradine Girt  [facility] 12/21/2017, 1:30 PM

## 2017-12-24 NOTE — Telephone Encounter (Signed)
Ok thanks 

## 2017-12-28 NOTE — ED Provider Notes (Signed)
[facility] Provider Note   CSN: [id number] Arrival date & time: 12/19/17  1427     History   Chief Complaint No chief complaint on file.   HPI Danny [name] is a 84 y.o. male.  HPI 84 y.o. male with medical history significant of CKD stage IV, coronary artery disease status post CABG, hypertension, hyperlipidemia, peripheral vascular disease, carotid artery disease status post prior carotid endarterectomy presenting to the ED with chief complaint of nausea and dizziness.  Patient reports that earlier today he was being driven for lunch when he started feeling nauseated followed by dizziness.  Patient felt like he was going to faint.  He denies any associated chest pain, palpitations.  His symptoms lasted for at least 30 minutes or so.  Patient no longer feels dizzy, however he still feels a little sluggish.  He denies any headaches, neck pain.  Past Medical History:  Diagnosis Date  . Basal cell carcinoma of nose    "right side; burned it off"  . Carotid artery disease ([place])    a. s/p prior carotid endarterectomy.  . Cerebrovascular disease   . Chronic kidney disease (CKD), stage III (moderate) (HCC)   . Coronary artery disease   . ED (erectile dysfunction)   . History of gout   . Hyperlipidemia   . Hypertension   . Insomnia   . Myocardial infarction ([place]) 2002  . PVD (peripheral vascular disease) ([place])    a. s/p right and left common iliac artery angioplasty with bilateral stent placement.  . Syncope   . Unequal blood pressure in upper extremities     Patient Active Problem List   Diagnosis Date Noted  . Near syncope 12/19/2017  . Gout 12/19/2017  . Anemia of chronic disease 12/19/2017  . Stenosis of left subclavian artery ([place]) 06/05/2016  . History of bilateral carotid endarterectomy 06/04/2016  . PAD (peripheral artery disease) ([place]) 08/05/2014  . Essential hypertension, benign 02/12/2013  . Coronary artery disease involving coronary  bypass graft of native heart with angina pectoris ([place]) 02/12/2013  . CKD (chronic kidney disease) stage 4, GFR 15-29 ml/min (HCC) 02/12/2013  . Hyperlipidemia 02/12/2013  . C. difficile colitis 08/26/2011  . Hypotension 08/24/2011  . Anemia 08/24/2011    Past Surgical History:  Procedure Laterality Date  . ABDOMINAL HERNIA REPAIR  X 4  . APPENDECTOMY    . CAROTID ENDARTERECTOMY Bilateral   . CORONARY ANGIOPLASTY WITH STENT PLACEMENT     "~ 6 months before bypass"  . CORONARY ARTERY BYPASS GRAFT  2002   "CABG X4"  . HERNIA REPAIR    . PERIPHERAL VASCULAR CATHETERIZATION N/A 08/05/2014   Procedure: Abdominal Aortogram;  Surgeon: Wellington Hampshire, MD;  Location: [facility];  Service: Cardiovascular;  Laterality: N/A;  . PERIPHERAL VASCULAR CATHETERIZATION Bilateral 08/05/2014   Procedure: Peripheral Vascular Intervention;  Surgeon: Wellington Hampshire, MD;  Location: [facility];  Service: Cardiovascular;  Laterality: Bilateral;  iliacs        Home Medications    Prior to Admission medications   Medication Sig Start Date End Date Taking? Authorizing Provider  allopurinol (ZYLOPRIM) 100 MG tablet Take 1 tablet by mouth at bedtime.  06/05/16  Yes [provider]  aspirin EC 81 MG tablet Take 81 mg by mouth daily.   Yes [provider]  atorvastatin (LIPITOR) 40 MG tablet Take 40 mg by mouth daily at 6 PM.    Yes [provider]  cyanocobalamin 100 MCG tablet Take 100 mcg by mouth daily.   Yes [provider]  lisinopril (PRINIVIL,ZESTRIL) 20 MG tablet Take 20 mg by mouth at bedtime.    Yes [provider]  metoprolol tartrate (LOPRESSOR) 25 MG tablet TAKE 1 TABLET BY MOUTH TWICE A DAY Patient taking differently: Take 25 mg by mouth 2 (two) times daily.  08/21/17  Yes Belva Crome, MD  nitroGLYCERIN (NITROSTAT) 0.4 MG SL tablet Place 0.4 mg under the tongue every 5 (five) minutes as needed for chest pain.    Yes [provider]  amLODipine (NORVASC) 2.5 MG tablet Take 1 tablet (2.5 mg total) by mouth at bedtime. 12/22/17   Geradine Girt, DO    Family History Family History  Problem Relation Age of Onset  . Heart failure Mother   . Heart failure Father   . Heart attack Neg Hx   . Hypertension Neg Hx     Social History Social History   Tobacco Use  . Smoking status: Former Smoker    Packs/day: 1.00    Years: 56.00    Pack years: 56.00    Types: Cigarettes  . Smokeless tobacco: Never Used  . Tobacco comment: "stopped smoking when I had MI in 2002"  Substance Use Topics  . Alcohol use: No  . Drug use: No     Allergies   Clindamycin/lincomycin   Review of Systems Review of Systems  Constitutional: Positive for activity change.  Respiratory: Negative for shortness of breath.   Cardiovascular: Negative for chest pain and palpitations.  Neurological: Positive for light-headedness.  All other systems reviewed and are negative.    Physical Exam Updated Vital Signs BP (!) (P) 131/45 (BP Location: Right Arm)   Pulse (P) 69   Temp (P) 98.4 F (36.9 C) (Oral)   Resp (P) 18   Ht 5\' 6"  (1.676 m)   Wt 76.1 kg   SpO2 (P) 98%   BMI 27.08 kg/m   Physical Exam  Constitutional: He is oriented to person, place, and time. He appears well-developed.  HENT:  Head: Atraumatic.  Eyes: Pupils are equal, round, and reactive to light. EOM are normal.  No nystagmus  Neck: Neck supple.  Cardiovascular: Normal rate.  Pulmonary/Chest: Effort normal.  Abdominal: Soft. He exhibits no mass.  Neurological: He is alert and oriented to person, place, and time.  Cerebellar exam is normal (finger to nose) Sensory exam normal for bilateral upper and lower extremities - and patient is able to discriminate between sharp and dull. Motor exam is 4+/5   Skin: Skin is warm.  Nursing note and vitals reviewed.    ED Treatments / Results  Labs (all labs ordered are listed, but only abnormal  results are displayed) Labs Reviewed  BASIC METABOLIC PANEL - Abnormal; Notable for the following components:      Result Value   CO2 21 (*)    BUN 28 (*)    Creatinine, Ser 1.59 (*)    GFR calc non Af Amer 37 (*)    GFR calc Af Amer 43 (*)    All other components within normal limits  CBC - Abnormal; Notable for the following components:   RBC 3.17 (*)    Hemoglobin 10.8 (*)    HCT 32.9 (*)    MCV 103.8 (*)    MCH 34.1 (*)    All other components within normal limits  URINALYSIS, ROUTINE W REFLEX MICROSCOPIC - Abnormal; Notable for the following components:  Hgb urine dipstick SMALL (*)    All other components within normal limits  BRAIN NATRIURETIC PEPTIDE - Abnormal; Notable for the following components:   B Natriuretic Peptide 322.2 (*)    All other components within normal limits  BASIC METABOLIC PANEL - Abnormal; Notable for the following components:   CO2 21 (*)    Glucose, Bld 103 (*)    Creatinine, Ser 1.41 (*)    GFR calc non Af Amer 43 (*)    GFR calc Af Amer 49 (*)    All other components within normal limits  TROPONIN I    EKG EKG Interpretation  Date/Time:  Wednesday December 19 2017 14:37:45 EDT Ventricular Rate:  62 PR Interval:    QRS Duration: 107 QT Interval:  410 QTC Calculation: 417 R Axis:   -17 Text Interpretation:  Sinus rhythm Borderline short PR interval Abnormal R-wave progression, early transition LVH with secondary repolarization abnormality No acute changes Nonspecific ST and T wave abnormality LVH is new Confirmed by Varney Biles [phone]) on 12/19/2017 3:17:22 PM   Radiology No results found.  Procedures Procedures (including critical care time)  Medications Ordered in ED Medications  diphenhydrAMINE (BENADRYL) capsule 25 mg (25 mg Oral Given 12/20/17 0458)     Initial Impression / Assessment and Plan / ED Course  I have reviewed the triage vital signs and the nursing notes.  Pertinent labs & imaging results that were  available during my care of the patient were reviewed by me and considered in my medical decision making (see chart for details).     DDx includes: Orthostatic hypotension Stroke Vertebral artery dissection/stenosis Dysrhythmia PE Vasovagal/neurocardiogenic syncope Aortic stenosis Valvular disorder/Cardiomyopathy Anemia  84 year old male with history of coronary artery disease, valvular disorder, carotid artery disease comes in with chief complaint of near syncope.  Differential diagnosis mainly includes dysrhythmia, TIA.  We will admit patient for further work-up.  Final Clinical Impressions(s) / ED Diagnoses   Final diagnoses:  Near syncope    ED Discharge Orders         Ordered    amLODipine (NORVASC) 2.5 MG tablet  Daily at bedtime     12/21/17 1330    Increase activity slowly     12/21/17 1330    Diet - low sodium heart healthy     12/21/17 1330    Discharge instructions    Comments:  Be sure to stay hydrated   12/21/17 [place], [place], MD 12/28/17 [phone]

## 2018-01-03 NOTE — Progress Notes (Signed)
Cardiology Office Note   Date:  01/06/2018   ID:  Danny [name], DOB 03/04/1927, MRN [id number]  PCP:  Lavone Orn, MD  Cardiologist:  Dr. Tamala Julian    Chief Complaint  Patient presents with  . Hospitalization Follow-up      History of Present Illness: Danny [name] is a 84 y.o. male who presents for post hospitlization for near syncope   He has a history of CAD, PAD, HTN, CKD   H Tamala Julian and M Arida  Known CAD (s/p CABG  2002); bilateral CEA, CKD, HTN, Bilaterl iliac artery stenting     Pt was in car yesterday as passenger  Felt nauseated, warm.  Vision dimmed.  Lasted 15 min  OK after about 2 hours   NO CP   No SOB  Transient tinglin in L arm   Had tingling 2 wks ago.  EMS called   BP sittig 126/84   Standing 80/    Taken to [facility]   In hospital The pt does say that he has been getting more winded when he works in yard   Denies CP   No PND   He was orthostatic on evaluation Pt claims he has been eating/drinking enough each day   U.A. Did have SG of 1.014, mildly concentrated.   Has been hydrated since  Mild DOE, no further pain.  Is eating and drinking well.  His daughter is with him today.  He does have DOE with working in the yard a few times a day, he rests and it improves.  No chest pain.  Pt prefers conservative therapy.  Unable to add imdur with recent hypotension.  He also has hip pain and hx of PAD.  He only has dyspnea with exertion, heavy exertion for him. .   Past Medical History:  Diagnosis Date  . Basal cell carcinoma of nose    "right side; burned it off"  . Carotid artery disease ([place])    a. s/p prior carotid endarterectomy.  . Cerebrovascular disease   . Chronic kidney disease (CKD), stage III (moderate) (HCC)   . Coronary artery disease   . ED (erectile dysfunction)   . History of gout   . Hyperlipidemia   . Hypertension   . Insomnia   . Myocardial infarction ([place]) 2002  . PVD (peripheral vascular disease) ([place])    a. s/p right and left  common iliac artery angioplasty with bilateral stent placement.  . Syncope   . Unequal blood pressure in upper extremities     Past Surgical History:  Procedure Laterality Date  . ABDOMINAL HERNIA REPAIR  X 4  . APPENDECTOMY    . CAROTID ENDARTERECTOMY Bilateral   . CORONARY ANGIOPLASTY WITH STENT PLACEMENT     "~ 6 months before bypass"  . CORONARY ARTERY BYPASS GRAFT  2002   "CABG X4"  . HERNIA REPAIR    . PERIPHERAL VASCULAR CATHETERIZATION N/A 08/05/2014   Procedure: Abdominal Aortogram;  Surgeon: Wellington Hampshire, MD;  Location: [facility];  Service: Cardiovascular;  Laterality: N/A;  . PERIPHERAL VASCULAR CATHETERIZATION Bilateral 08/05/2014   Procedure: Peripheral Vascular Intervention;  Surgeon: Wellington Hampshire, MD;  Location: [facility];  Service: Cardiovascular;  Laterality: Bilateral;  iliacs     Current Outpatient Medications  Medication Sig Dispense Refill  . allopurinol (ZYLOPRIM) 100 MG tablet Take 1 tablet by mouth at bedtime.     Marland Kitchen amLODipine (NORVASC) 2.5 MG tablet Take 1 tablet (2.5  mg total) by mouth at bedtime. 30 tablet 0  . aspirin EC 81 MG tablet Take 81 mg by mouth daily.    Marland Kitchen atorvastatin (LIPITOR) 40 MG tablet Take 40 mg by mouth daily at 6 PM.     . cyanocobalamin 100 MCG tablet Take 100 mcg by mouth daily.    Marland Kitchen lisinopril (PRINIVIL,ZESTRIL) 20 MG tablet Take 20 mg by mouth at bedtime.     . metoprolol tartrate (LOPRESSOR) 25 MG tablet TAKE 1 TABLET BY MOUTH TWICE A DAY (Patient taking differently: Take 25 mg by mouth 2 (two) times daily. ) 180 tablet 2  . nitroGLYCERIN (NITROSTAT) 0.4 MG SL tablet Place 0.4 mg under the tongue every 5 (five) minutes as needed for chest pain.      No current facility-administered medications for this visit.     Allergies:   Clindamycin/lincomycin    Social History:  The patient  reports that he has quit smoking. His smoking use included cigarettes. He has a 56.00 pack-year smoking history. He has never  used smokeless tobacco. He reports that he does not drink alcohol or use drugs.   Family History:  The patient's family history includes Heart failure in his father and mother.    ROS:  General:no colds or fevers, no weight changes Skin:no rashes or ulcers HEENT:no blurred vision, no congestion CV:see HPI PUL:see HPI GI:no diarrhea constipation or melena, no indigestion GU:no hematuria, no dysuria MS:no joint pain, no claudication Neuro:no syncope, no lightheadedness Endo:no diabetes, no thyroid disease Wt Readings from Last 3 Encounters:  01/04/18 173 lb (78.5 kg)  12/19/17 167 lb 12.3 oz (76.1 kg)  07/10/17 173 lb (78.5 kg)     PHYSICAL EXAM: VS:  BP (!) 134/53   Pulse 60   Ht 5\' 6"  (1.676 m)   Wt 173 lb (78.5 kg)   BMI 27.92 kg/m  , BMI Body mass index is 27.92 kg/m. General:Pleasant affect, NAD Skin:Warm and dry, brisk capillary refill HEENT:normocephalic, sclera clear, mucus membranes moist Neck:supple, no JVD, no bruits  Heart:S1S2 RRR without murmur, gallup, rub or click Lungs:clear without rales, rhonchi, or wheezes WUX:LKGM, non tender, + BS, do not palpate liver spleen or masses Ext:no lower ext edema, 2+ pedal pulses, 2+ radial pulses Neuro:alert and oriented, MAE, follows commands, + facial symmetry    EKG:  EKG is NOT ordered today.    Recent Labs: 12/19/2017: B Natriuretic Peptide 322.2; Hemoglobin 10.8; Platelets 157 12/20/2017: BUN 23; Creatinine, Ser 1.41; Potassium 4.7; Sodium 138    Lipid Panel No results found for: CHOL, TRIG, HDL, CHOLHDL, VLDL, LDLCALC, LDLDIRECT     Other studies Reviewed: Additional studies/ records that were reviewed today include: . Echo  12/20/17 Study Conclusions  - Left ventricle: The cavity size was normal. There was moderate   concentric hypertrophy. Systolic function was normal. The   estimated ejection fraction was in the range of 60% to 65%. Wall   motion was normal; there were no regional wall motion    abnormalities. Doppler parameters are consistent with abnormal   left ventricular relaxation (grade 1 diastolic dysfunction). - Aortic valve: Trileaflet; mildly thickened, moderately calcified   leaflets. - Mitral valve: There was moderate regurgitation. - Left atrium: The atrium was mildly dilated. - Tricuspid valve: There was mild regurgitation. - Pulmonary arteries: Systolic pressure was mildly increased.  Carotid dopplers  12/20/17 Summary: Right Carotid: Right internal carotid artery velocities suggest 1-39% internal        carotid artery stenosis, with  presence of calcific plaque.         Right vertebral artery is patent with antegrade flow.  Left Carotid: Left internal carotid artery peak systolic velocities suggest       80-99% stenosis, however end diastolic velocities and plaque       morphology is suggestive of 1-39% stenosis. Elevated peak systolic       velocities may be secondary to possible proximal stenosis.        Left vertebral artery is patent with retrograde flow. Left       subclavian artery is patent with antegrade flow, however waveforms       are monophasic. These findings are suggestive of a possible       proximal subclavian artery stenosis.   ASSESSMENT AND PLAN:  1. orthostatic hypotension, no further episodes.  He will follow up with Dr. Tamala Julian in 3 months.    2.  DOE may be angina, unable to add imdur with recent orthostatic hypotension - he and his daughter would prefer conservative treatment.  If DOE increases , now with heavy exertion he will call.  3.  PAD followed by Dr. Fletcher Anon and stable on dopplers 06/2017.  Does have Rt hip pain, unsure claudication vs. Arthritis.   4.  CAD with hx of CABG 2002, no chest pain only DOE   Current medicines are reviewed with the patient today.  The patient Has no concerns regarding medicines.  The following changes have been made:  See  above Labs/ tests ordered today include:see above  Disposition:   FU:  see above  Signed, Cecilie Kicks, NP  01/06/2018 9:42 PM    [facility] [place], [place], [place], [place] Phone: [phone]; Fax: [phone]

## 2018-01-04 ENCOUNTER — Ambulatory Visit: Payer: Medicare Other | Admitting: Cardiology

## 2018-01-04 VITALS — BP 134/53 | HR 60 | Ht 66.0 in | Wt 173.0 lb

## 2018-01-04 DIAGNOSIS — I25118 Atherosclerotic heart disease of native coronary artery with other forms of angina pectoris: Secondary | ICD-10-CM

## 2018-01-04 DIAGNOSIS — I951 Orthostatic hypotension: Secondary | ICD-10-CM

## 2018-01-04 DIAGNOSIS — I739 Peripheral vascular disease, unspecified: Secondary | ICD-10-CM | POA: Diagnosis not present

## 2018-01-04 NOTE — Patient Instructions (Addendum)
Medication Instructions:  Your physician recommends that you continue on your current medications as directed. Please refer to the Current Medication list given to you today.  If you need a refill on your cardiac medications before your next appointment, please call your pharmacy.   Lab work: None ordered  If you have labs (blood work) drawn today and your tests are completely normal, you will receive your results only by: Marland Kitchen MyChart Message (if you have MyChart) OR . A paper copy in the mail If you have any lab test that is abnormal or we need to change your treatment, we will call you to review the results.  Testing/Procedures: None ordered  Follow-Up: Your physician recommends that you schedule a follow-up appointment in: [place]  Any Other Special Instructions Will Be Listed Below (If Applicable).

## 2018-01-06 ENCOUNTER — Encounter: Payer: Self-pay | Admitting: Cardiology

## 2018-01-11 ENCOUNTER — Encounter (HOSPITAL_COMMUNITY): Payer: Medicare Other

## 2018-04-08 ENCOUNTER — Ambulatory Visit: Payer: Medicare Other | Admitting: Interventional Cardiology

## 2018-05-17 ENCOUNTER — Other Ambulatory Visit: Payer: Self-pay | Admitting: Interventional Cardiology

## 2018-05-22 ENCOUNTER — Telehealth: Payer: Self-pay | Admitting: Interventional Cardiology

## 2018-05-22 NOTE — Telephone Encounter (Signed)
Pt c/o swelling: STAT is pt has developed SOB within 24 hours  1) How much weight have you gained and in what time span?  Pt have not weighted  2) If swelling, where is the swelling located? Legs and ankles  3) Are you currently taking a fluid pill? No, he stopped taking his yesterday and today-- he stopped taking it, because he felt like he was going to pass out  4) Are you currently SOB? Sometimes, it depends on what he does  5) Do you have a log of your daily weights (if so, list)? no  6) Have you gained 3 pounds in a day or 5 pounds in a week? daughter does not think so   7) Have you traveled recently? No- pt 's daughter wants him to be seen asap

## 2018-05-22 NOTE — Telephone Encounter (Signed)
Spoke with daughter, DPR on file.  She states pt has been having issues with swelling in feet and ankles for the last 2 weeks.  They haven't checked weight.  Daughter has noticed worsening SOB over the last month or so.  Pt's BP running on the lower side and PCP recently d/c'ed Amlodipine.  On Saturday pt started taking some HCTZ 12.5mg  that he had at home but no improvement in swelling.  Daughter would like sooner appt if possible.  Scheduled pt to see Dr. Tamala Julian tomorrow at 2pm.  Daughter very appreciative for call.

## 2018-05-23 ENCOUNTER — Ambulatory Visit: Payer: Medicare Other | Admitting: Interventional Cardiology

## 2018-05-23 ENCOUNTER — Other Ambulatory Visit: Payer: Self-pay

## 2018-05-23 ENCOUNTER — Encounter: Payer: Self-pay | Admitting: Interventional Cardiology

## 2018-05-23 VITALS — BP 128/68 | HR 60 | Ht 66.0 in | Wt 175.6 lb

## 2018-05-23 DIAGNOSIS — E7849 Other hyperlipidemia: Secondary | ICD-10-CM

## 2018-05-23 DIAGNOSIS — I10 Essential (primary) hypertension: Secondary | ICD-10-CM | POA: Diagnosis not present

## 2018-05-23 DIAGNOSIS — N184 Chronic kidney disease, stage 4 (severe): Secondary | ICD-10-CM | POA: Diagnosis not present

## 2018-05-23 DIAGNOSIS — I739 Peripheral vascular disease, unspecified: Secondary | ICD-10-CM

## 2018-05-23 DIAGNOSIS — R55 Syncope and collapse: Secondary | ICD-10-CM

## 2018-05-23 DIAGNOSIS — I779 Disorder of arteries and arterioles, unspecified: Secondary | ICD-10-CM

## 2018-05-23 DIAGNOSIS — I25118 Atherosclerotic heart disease of native coronary artery with other forms of angina pectoris: Secondary | ICD-10-CM | POA: Diagnosis not present

## 2018-05-23 DIAGNOSIS — R0602 Shortness of breath: Secondary | ICD-10-CM

## 2018-05-23 NOTE — Progress Notes (Signed)
Cardiology Office Note:    Date:  05/23/2018   ID:  Danny [name], DOB [birth year], MRN [id number]  PCP:  Lavone Orn, MD  Cardiologist:  Sinclair Grooms, MD   Referring MD: Lavone Orn, MD   Chief Complaint  Patient presents with  . Coronary Artery Disease    History of Present Illness:    Danny [name] is a 84 y.o. male with a hx of coronary artery disease and prior coronarybypass (LIMA to LAD, SVG to ramus, SVG to obtuse marginal, and SVG to PDA. 2002), hypertension, hyperlipidemia, PAD with bilateral iliac stent procedure 2016 by Dr. Fletcher Anon, carotid disease(bilat CEA), and chronic kidney disease.  Danny [name] is accompanied by his daughter.  Since being seen by me he has had 2 episodes of near syncope.  The first episode occurred in October.  He ended up in the emergency room.  His antihypertensive therapy was adjusted downward and he was told that his blood pressure was dropping.  Amlodipine was decreased to 2-1/2 mg at that time.  He subsequently has had amlodipine completely discontinued as well as hydrochlorothiazide.  Over the past month he has had increasing lower extremity swelling, dyspnea on exertion, and some orthopnea.  His daughter started him back on HCTZ for 3 straight days and then he had a second episode of foggy headedness/near syncope that occurred while sitting.  She therefore discontinued HCTZ.  He denies angina.  He has no history of palpitations.  He is still active and living independently.  Past Medical History:  Diagnosis Date  . Basal cell carcinoma of nose    "right side; burned it off"  . Carotid artery disease ([place])    a. s/p prior carotid endarterectomy.  . Cerebrovascular disease   . Chronic kidney disease (CKD), stage III (moderate) (HCC)   . Coronary artery disease   . ED (erectile dysfunction)   . History of gout   . Hyperlipidemia   . Hypertension   . Insomnia   . Myocardial infarction ([place]) 2002  . PVD (peripheral vascular disease)  ([place])    a. s/p right and left common iliac artery angioplasty with bilateral stent placement.  . Syncope   . Unequal blood pressure in upper extremities     Past Surgical History:  Procedure Laterality Date  . ABDOMINAL HERNIA REPAIR  X 4  . APPENDECTOMY    . CAROTID ENDARTERECTOMY Bilateral   . CORONARY ANGIOPLASTY WITH STENT PLACEMENT     "~ 6 months before bypass"  . CORONARY ARTERY BYPASS GRAFT  2002   "CABG X4"  . HERNIA REPAIR    . PERIPHERAL VASCULAR CATHETERIZATION N/A 08/05/2014   Procedure: Abdominal Aortogram;  Surgeon: Wellington Hampshire, MD;  Location: [facility];  Service: Cardiovascular;  Laterality: N/A;  . PERIPHERAL VASCULAR CATHETERIZATION Bilateral 08/05/2014   Procedure: Peripheral Vascular Intervention;  Surgeon: Wellington Hampshire, MD;  Location: [facility];  Service: Cardiovascular;  Laterality: Bilateral;  iliacs    Current Medications: Current Meds  Medication Sig  . allopurinol (ZYLOPRIM) 100 MG tablet Take 1 tablet by mouth at bedtime.   Marland Kitchen aspirin EC 81 MG tablet Take 81 mg by mouth daily.  Marland Kitchen atorvastatin (LIPITOR) 40 MG tablet Take 40 mg by mouth daily at 6 PM.   . cyanocobalamin 100 MCG tablet Take 100 mcg by mouth daily.  Marland Kitchen lisinopril (PRINIVIL,ZESTRIL) 20 MG tablet Take 20 mg by mouth at bedtime.   . metoprolol tartrate (LOPRESSOR) 25  MG tablet TAKE 1 TABLET BY MOUTH TWICE A DAY  . nitroGLYCERIN (NITROSTAT) 0.4 MG SL tablet Place 0.4 mg under the tongue every 5 (five) minutes as needed for chest pain.      Allergies:   Clindamycin/lincomycin   Social History   Socioeconomic History  . Marital status: Widowed    Spouse name: Not on file  . Number of children: Not on file  . Years of education: Not on file  . Highest education level: Not on file  Occupational History  . Not on file  Social Needs  . Financial resource strain: Not on file  . Food insecurity:    Worry: Not on file    Inability: Not on file  . Transportation  needs:    Medical: Not on file    Non-medical: Not on file  Tobacco Use  . Smoking status: Former Smoker    Packs/day: 1.00    Years: 56.00    Pack years: 56.00    Types: Cigarettes  . Smokeless tobacco: Never Used  . Tobacco comment: "stopped smoking when I had MI in 2002"  Substance and Sexual Activity  . Alcohol use: No  . Drug use: No  . Sexual activity: Not on file  Lifestyle  . Physical activity:    Days per week: Not on file    Minutes per session: Not on file  . Stress: Not on file  Relationships  . Social connections:    Talks on phone: Not on file    Gets together: Not on file    Attends religious service: Not on file    Active member of club or organization: Not on file    Attends meetings of clubs or organizations: Not on file    Relationship status: Not on file  Other Topics Concern  . Not on file  Social History Narrative  . Not on file     Family History: The patient's family history includes Heart failure in his father and mother. There is no history of Heart attack or Hypertension.  ROS:   Please see the history of present illness.    Leg swelling and shortness of breath of the new complaints and are outlined above.  Decreased hearing.  Decreased memory.  All other systems reviewed and are negative.  EKGs/Labs/Other Studies Reviewed:    The following studies were reviewed today: No new data sodium was 1.41 in October 2019.  EKG:  EKG not repeated.  Last tracing  Recent Labs: 12/19/2017: B Natriuretic Peptide 322.2; Hemoglobin 10.8; Platelets 157 12/20/2017: BUN 23; Creatinine, Ser 1.41; Potassium 4.7; Sodium 138  Recent Lipid Panel No results found for: CHOL, TRIG, HDL, CHOLHDL, VLDL, LDLCALC, LDLDIRECT  Physical Exam:    VS:  BP 128/68   Pulse 60   Ht 5\' 6"  (1.676 m)   Wt 175 lb 9.6 oz (79.7 kg)   SpO2 96%   BMI 28.34 kg/m     Wt Readings from Last 3 Encounters:  05/23/18 175 lb 9.6 oz (79.7 kg)  01/04/18 173 lb (78.5 kg)  12/19/17  167 lb 12.3 oz (76.1 kg)     GEN: Elderly.. No acute distress HEENT: Normal NECK: No JVD. LYMPHATICS: No lymphadenopathy CARDIAC: RRR.  1/6 systolic murmur, no gallop, left greater than right 1-2+ lower extremity edema VASCULAR: 2+ bilateral radial pulses, no bruits RESPIRATORY:  Clear to auscultation without rales, wheezing or rhonchi  ABDOMEN: Soft, non-tender, non-distended, No pulsatile mass, MUSCULOSKELETAL: No deformity  SKIN: Warm and dry  NEUROLOGIC:  Alert and oriented x 3 PSYCHIATRIC:  Normal affect   ASSESSMENT:    1. Other hyperlipidemia   2. Coronary artery disease involving native coronary artery of native heart with other form of angina pectoris ([place])   3. CKD (chronic kidney disease) stage 4, GFR 15-29 ml/min (HCC)   4. Essential hypertension   5. Carotid artery disease, unspecified laterality ([place])   6. PAD (peripheral artery disease) ([place])   7. Near syncope   8. SOB (shortness of breath)    PLAN:    In order of problems listed above:  1. LDL will be evaluated today 2. Secondary prevention is discussed briefly 3. Basic metabolic panel will be obtained today 4. Blood pressure personally performed demonstrates a sitting blood pressure of 180/80 mmHg, decreases immediately to 160/60 standing, and decreases further to 150/62 while standing for 5 minutes.  Therefore he does have pretty significant orthostasis without medications that would contribute heavily.  We will check a BNP and kidney function today. 5. Not addressed 6. Not addressed 7. 30-day monitor to exclude transient arrhythmias that could be causing lightheaded near syncope episodes 8. Concerned about diastolic heart failure.  BNP will be checked today.  With current blood pressures we may need to reinitiate low-dose diuretic therapy, and compression stockings, and consider decreasing ACE inhibitor therapy intensity.  Will make further recommendations concerning the possibility of heart failure and the  re-addition of diuretic therapy based upon laboratory data.  Overall education and awareness concerning primary/secondary risk prevention was discussed in detail: LDL less than 70, hemoglobin A1c less than 7, blood pressure target less than 130/80 mmHg, >150 minutes of moderate aerobic activity per week, avoidance of smoking, weight control (via diet and exercise), and continued surveillance/management of/for obstructive sleep apnea.  Follow-up with team member in 7 month.   Medication Adjustments/Labs and Tests Ordered: Current medicines are reviewed at length with the patient today.  Concerns regarding medicines are outlined above.  No orders of the defined types were placed in this encounter.  No orders of the defined types were placed in this encounter.   There are no Patient Instructions on file for this visit.   Signed, Sinclair Grooms, MD  05/23/2018 2:20 PM    [facility]

## 2018-05-23 NOTE — Patient Instructions (Signed)
Medication Instructions:  Your physician recommends that you continue on your current medications as directed. Please refer to the Current Medication list given to you today.  If you need a refill on your cardiac medications before your next appointment, please call your pharmacy.   Lab work: BMET, CBC, Liver, Lipid and Pro BNP  If you have labs (blood work) drawn today and your tests are completely normal, you will receive your results only by: Marland Kitchen MyChart Message (if you have MyChart) OR . A paper copy in the mail If you have any lab test that is abnormal or we need to change your treatment, we will call you to review the results.  Testing/Procedures: Your physician has recommended that you wear an event monitor. Event monitors are medical devices that record the heart's electrical activity. Doctors most often [place] these monitors to diagnose arrhythmias. Arrhythmias are problems with the speed or rhythm of the heartbeat. The monitor is a small, portable device. You can wear one while you do your normal daily activities. This is usually used to diagnose what is causing palpitations/syncope (passing out).   Follow-Up: Your physician recommends that you schedule a follow-up appointment in: 1 month with Cecilie Kicks, NP with BMET.    Any Other Special Instructions Will Be Listed Below (If Applicable).

## 2018-05-24 ENCOUNTER — Telehealth: Payer: Self-pay | Admitting: Interventional Cardiology

## 2018-05-24 DIAGNOSIS — I10 Essential (primary) hypertension: Secondary | ICD-10-CM

## 2018-05-24 LAB — HEPATIC FUNCTION PANEL
ALT: 19 IU/L (ref 0–44)
AST: 17 IU/L (ref 0–40)
Albumin: 3.9 g/dL (ref 3.6–4.6)
Alkaline Phosphatase: 101 IU/L (ref 39–117)
BILIRUBIN TOTAL: 0.8 mg/dL (ref 0.0–1.2)
Bilirubin, Direct: 0.27 mg/dL (ref 0.00–0.40)
Total Protein: 6.8 g/dL (ref 6.0–8.5)

## 2018-05-24 LAB — BASIC METABOLIC PANEL
BUN/Creatinine Ratio: 10 (ref 10–24)
BUN: 17 mg/dL (ref 8–27)
CALCIUM: 9.2 mg/dL (ref 8.6–10.2)
CO2: 23 mmol/L (ref 20–29)
Chloride: 103 mmol/L (ref 96–106)
Creatinine, Ser: 1.68 mg/dL — ABNORMAL HIGH (ref 0.76–1.27)
GFR calc non Af Amer: 35 mL/min/{1.73_m2} — ABNORMAL LOW (ref >59)
GFR, EST AFRICAN AMERICAN: 41 mL/min/{1.73_m2} — AB (ref >59)
GLUCOSE: 110 mg/dL — AB (ref 65–99)
POTASSIUM: 5.3 mmol/L — AB (ref 3.5–5.2)
Sodium: 140 mmol/L (ref 134–144)

## 2018-05-24 LAB — LIPID PANEL
CHOL/HDL RATIO: 2.3 ratio (ref 0.0–5.0)
CHOLESTEROL TOTAL: 142 mg/dL (ref 100–199)
HDL: 61 mg/dL (ref >39)
LDL CALC: 70 mg/dL (ref 0–99)
Triglycerides: 53 mg/dL (ref 0–149)
VLDL CHOLESTEROL CAL: 11 mg/dL (ref 5–40)

## 2018-05-24 LAB — CBC
HEMATOCRIT: 33.7 % — AB (ref 37.5–51.0)
HEMOGLOBIN: 11 g/dL — AB (ref 13.0–17.7)
MCH: 34 pg — ABNORMAL HIGH (ref 26.6–33.0)
MCHC: 32.6 g/dL (ref 31.5–35.7)
MCV: 104 fL — ABNORMAL HIGH (ref 79–97)
Platelets: 192 10*3/uL (ref 150–450)
RBC: 3.24 x10E6/uL — ABNORMAL LOW (ref 4.14–5.80)
RDW: 13.6 % (ref 11.6–15.4)
WBC: 8.3 10*3/uL (ref 3.4–10.8)

## 2018-05-24 LAB — PRO B NATRIURETIC PEPTIDE: NT-PRO BNP: 6362 pg/mL — AB (ref 0–486)

## 2018-05-24 MED ORDER — FUROSEMIDE 40 MG PO TABS: 40.0000 mg | ORAL_TABLET | Freq: Every day | ORAL | 3 refills | Status: DC

## 2018-05-24 NOTE — Telephone Encounter (Signed)
New Message   Patients daughter [name] is returning call in reference to patients lab results. Please call.

## 2018-05-24 NOTE — Telephone Encounter (Signed)
Spoke with daughter and made her aware of results and recommendations per Dr. Tamala Julian.  Daughter verbalized understanding and was in agreement with this plan. Pt will have labs drawn on the 25th when he has monitor placed.

## 2018-05-29 ENCOUNTER — Telehealth: Payer: Self-pay | Admitting: Interventional Cardiology

## 2018-05-29 DIAGNOSIS — I10 Essential (primary) hypertension: Secondary | ICD-10-CM

## 2018-05-29 NOTE — Telephone Encounter (Signed)
° °  Daughter calling to discuss upcoming appt, can appt be postponed? She has questions about kidney function and Lasix dosge

## 2018-05-29 NOTE — Telephone Encounter (Signed)
Spoke with the pt daughter, [name] on [place], and she is concerned re: the pts upcoming appts for 06/05/18 for repeat labs and a monitor... ROV 06/27/18 with Cecilie Kicks NP...  She reports that the pt is feeling much better... he has not lost much weight, only down 2 lbs but not sure he is weighing same time with same clothing.Marland Kitchen  However, his abdomen has gone down, his breathing is much easier, but legs are still swollen... have improved minimally.Marland Kitchen   She would not feel comfortable not having his labs rechecked but was wondering if the monitor could wait a few weeks.. she she could take him to the smaller [facility] on [address] near her home for his labs..   Advised her we will talk with Dr. Tamala Julian and will call her back with his recommendation.    Pt seen 05/23/18... labs 05/23/18... has been on Lasix 40mg  a day since then.

## 2018-05-30 ENCOUNTER — Telehealth: Payer: Self-pay

## 2018-05-30 NOTE — Telephone Encounter (Signed)
Okay to delay the monitor. Okay to do the labs at a smaller place.  Same labs need to be done so that I can be certain he is not being over diuresed.

## 2018-05-30 NOTE — Telephone Encounter (Signed)
After reviewing patient's chart Dr. Johnsie Cancel recommends patient's monitor be rescheduled at 6 to 8 weeks from original appointment, due to COVID 19 precautions. Danny [name] will call patient to reschedule.

## 2018-05-31 NOTE — Telephone Encounter (Signed)
Spoke with daughter and made her aware of recommendations per Dr. Tamala Julian.  Daughter will take pt to local [facility] for labs.

## 2018-06-04 ENCOUNTER — Other Ambulatory Visit: Payer: Self-pay | Admitting: *Deleted

## 2018-06-04 DIAGNOSIS — I10 Essential (primary) hypertension: Secondary | ICD-10-CM

## 2018-06-05 ENCOUNTER — Other Ambulatory Visit: Payer: Medicare Other

## 2018-06-05 LAB — BASIC METABOLIC PANEL
BUN/Creatinine Ratio: 18 (ref 10–24)
BUN: 34 mg/dL — ABNORMAL HIGH (ref 8–27)
CALCIUM: 9.1 mg/dL (ref 8.6–10.2)
CO2: 24 mmol/L (ref 20–29)
Chloride: 102 mmol/L (ref 96–106)
Creatinine, Ser: 1.91 mg/dL — ABNORMAL HIGH (ref 0.76–1.27)
GFR calc Af Amer: 35 mL/min/{1.73_m2} — ABNORMAL LOW (ref >59)
GFR, EST NON AFRICAN AMERICAN: 30 mL/min/{1.73_m2} — AB (ref >59)
GLUCOSE: 93 mg/dL (ref 65–99)
Potassium: 4.3 mmol/L (ref 3.5–5.2)
Sodium: 143 mmol/L (ref 134–144)

## 2018-06-07 ENCOUNTER — Telehealth: Payer: Self-pay | Admitting: Interventional Cardiology

## 2018-06-07 DIAGNOSIS — I10 Essential (primary) hypertension: Secondary | ICD-10-CM

## 2018-06-07 MED ORDER — FUROSEMIDE 20 MG PO TABS: 20.0000 mg | ORAL_TABLET | Freq: Every day | ORAL | 3 refills | Status: DC

## 2018-06-07 NOTE — Telephone Encounter (Signed)
Daughter of Pt calling returning your call. She says she was calling to go over test results.

## 2018-06-07 NOTE — Addendum Note (Signed)
Addended by: Loren Racer on: 06/07/2018 02:07 PM   Modules accepted: Orders

## 2018-06-07 NOTE — Telephone Encounter (Signed)
Notes recorded by Belva Crome, MD on 06/07/2018 at 1:38 PM EDT Kidney function needs to be monitored. The only way to do this is with blood work. Decrease furosemide to 20 mg/day starting tomorrow. We still need to get a Bmet at some point   Spoke with daughter and went over recommendations. Daughter verbalized understanding and was in agreement with plan. She will take him to a local [facility] to have labs drawn.

## 2018-06-07 NOTE — Telephone Encounter (Signed)
See BMET results

## 2018-06-10 ENCOUNTER — Telehealth: Payer: Self-pay | Admitting: *Deleted

## 2018-06-10 NOTE — Telephone Encounter (Signed)
May 6 or May 11 or 12

## 2018-06-10 NOTE — Telephone Encounter (Signed)
Called patient regarding 06/05/18 Cardiac event monitor appointment, which, was cancelled due to COVID-19 precautions.  Discussed having cardiac event monitor shipped to his home directly from the monitor company.  Patients daughter, Lilla Shook [phone]) will be his emergency contact, and will assist applying monitor.  Patient aware he needs to answer phone to verify a shipping address with [employer].  Patient will be enrolled for [employer] to ship a 30 day cardiac event monitor today.

## 2018-06-12 NOTE — Telephone Encounter (Signed)
° °  Patient's daughter calling with questions regarding event monitor

## 2018-06-12 NOTE — Telephone Encounter (Signed)
Spoke with patients daughter, [name], at length regarding cardiac event monitor.  Monitor arrived  at her dads today.  She will be going to his house on Friday to apply and call [employer] to baseline.

## 2018-06-14 ENCOUNTER — Encounter (INDEPENDENT_AMBULATORY_CARE_PROVIDER_SITE_OTHER): Payer: Medicare Other

## 2018-06-14 DIAGNOSIS — R55 Syncope and collapse: Secondary | ICD-10-CM

## 2018-06-17 ENCOUNTER — Ambulatory Visit: Payer: Medicare Other | Admitting: Interventional Cardiology

## 2018-06-27 ENCOUNTER — Ambulatory Visit: Payer: Medicare Other | Admitting: Cardiology

## 2018-06-27 LAB — BASIC METABOLIC PANEL
BUN / CREAT RATIO: 19 (ref 10–24)
BUN: 31 mg/dL — ABNORMAL HIGH (ref 8–27)
CO2: 23 mmol/L (ref 20–29)
Calcium: 9.3 mg/dL (ref 8.6–10.2)
Chloride: 104 mmol/L (ref 96–106)
Creatinine, Ser: 1.66 mg/dL — ABNORMAL HIGH (ref 0.76–1.27)
GFR calc Af Amer: 42 mL/min/{1.73_m2} — ABNORMAL LOW (ref >59)
GFR, EST NON AFRICAN AMERICAN: 36 mL/min/{1.73_m2} — AB (ref >59)
Glucose: 164 mg/dL — ABNORMAL HIGH (ref 65–99)
POTASSIUM: 4.4 mmol/L (ref 3.5–5.2)
SODIUM: 144 mmol/L (ref 134–144)

## 2018-06-28 ENCOUNTER — Telehealth: Payer: Self-pay | Admitting: Cardiovascular Disease

## 2018-06-28 NOTE — Telephone Encounter (Signed)
Called and spoke with patient daughter, they have appointments on 05/05 for nuclear tests as well as appointment with Dr.Arida.  Daughter would like to know if these appointments are needed, or if they could be pushed back if possible to June or July.   I advised patient I would route a message to MD and his nurse to have his recommendations.

## 2018-06-28 NOTE — Telephone Encounter (Signed)
New Message  Patient's daughter would like a nurse to give her a call about appointments.

## 2018-07-01 NOTE — Telephone Encounter (Signed)
Dr. Daneen Schick is the primary cardiologist and I am not really sure who ordered the nuclear stress test. Regarding his follow-up appointment with me in May, this can be postponed until June or July as long as he does not have worsening claudication.

## 2018-07-01 NOTE — Telephone Encounter (Signed)
The patient's daughter has been called with Dr. Tyrell Antonio recommendations. She has verbalized her understanding and will call if anything is needed before July.  A message has been sent to scheduling to reschedule the appointments for the Aorta/ABI and follow up with Dr. Fletcher Anon.

## 2018-07-16 ENCOUNTER — Other Ambulatory Visit: Payer: Self-pay

## 2018-07-16 ENCOUNTER — Encounter (HOSPITAL_COMMUNITY): Payer: Medicare Other

## 2018-07-16 ENCOUNTER — Ambulatory Visit: Payer: Medicare Other | Admitting: Cardiovascular Disease

## 2018-07-22 NOTE — Progress Notes (Signed)
Virtual Visit via Video Note   This visit type was conducted due to national recommendations for restrictions regarding the COVID-19 Pandemic (e.g. social distancing) in an effort to limit this patient's exposure and mitigate transmission in our community.  Due to his co-morbid illnesses, this patient is at least at moderate risk for complications without adequate follow up.  This format is felt to be most appropriate for this patient at this time.  All issues noted in this document were discussed and addressed.  A limited physical exam was performed with this format.  Please refer to the patient's chart for his consent to telehealth for [facility].   Date:  07/23/2018   ID:  [name], DOB [birth year], MRN [id number]  Patient Location: Home Provider Location: Office  PCP:  Lavone Orn, MD  Cardiologist:  Sinclair Grooms, MD  Electrophysiologist:  None   Evaluation Performed:  Follow-Up Visit  Chief Complaint:  CAD  History of Present Illness:    REVEL STELLMACH is a 85 y.o. male with with a hx of coronary arterydisease and prior coronarybypass (LIMA to LAD, SVG to ramus, SVG to obtuse marginal, and SVG to PDA. 2002), hypertension, hyperlipidemia, PADwith bilateral iliacstent procedure2016 by Dr. Fletcher Anon, carotid disease(bilat CEA), and chronic kidney disease.  Last visit 05/23/18 he complained of 2 near syncope episodes.   He ended up in the emergency room.  His antihypertensive therapy was adjusted downward and he was told that his blood pressure was dropping.  Amlodipine was decreased to 2-1/2 mg at that time.  He subsequently has had amlodipine completely discontinued as well as hydrochlorothiazide.  also on that visit had increasing lower extremity swelling, dyspnea on exertion, and some orthopnea.  His daughter started him back on HCTZ for 3 straight days and then he had a second episode of foggy headedness/near syncope that occurred while sitting.  She therefore  discontinued HCTZ.  + orthostatic hypotension.  30 day event monitor ordered.  BNP checked.  Compressions stocking.  BNP 6362 Cr was 1.68, Na 140 K+ 5.3,  CBC stable, LDL 70 Lasix 40 mg daily started.   Follow up BMP with increase of Cr to 1.91  Lasix continued and repeat Cr is 1.66 a month ago.  Today his event monitor results not yet back.  He is bradycardic and only complaint is fatigue. No chest pain or SOB.  Minimal lower ext edema.  He is on lasix 20 mg daily.  No dizziness no syncope.  He is active, mows his grass and weed eats.  He is self isolating and family buys the food.  His daughter was with him for the visit  The patient does not have symptoms concerning for COVID-19 infection (fever, chills, cough, or new shortness of breath).    Past Medical History:  Diagnosis Date   Basal cell carcinoma of nose    "right side; burned it off"   Carotid artery disease ([place])    a. s/p prior carotid endarterectomy.   Cerebrovascular disease    Chronic kidney disease (CKD), stage III (moderate) (HCC)    Coronary artery disease    ED (erectile dysfunction)    History of gout    Hyperlipidemia    Hypertension    Insomnia    Myocardial infarction ([place]) 2002   PVD (peripheral vascular disease) ([place])    a. s/p right and left common iliac artery angioplasty with bilateral stent placement.   Syncope    Unequal blood pressure in upper  extremities    Past Surgical History:  Procedure Laterality Date   ABDOMINAL HERNIA REPAIR  X 4   APPENDECTOMY     CAROTID ENDARTERECTOMY Bilateral    CORONARY ANGIOPLASTY WITH STENT PLACEMENT     "~ 6 months before bypass"   CORONARY ARTERY BYPASS GRAFT  2002   "CABG X4"   HERNIA REPAIR     PERIPHERAL VASCULAR CATHETERIZATION N/A 08/05/2014   Procedure: Abdominal Aortogram;  Surgeon: Wellington Hampshire, MD;  Location: [place] [facility];  Service: Cardiovascular;  Laterality: N/A;   PERIPHERAL VASCULAR CATHETERIZATION Bilateral  08/05/2014   Procedure: Peripheral Vascular Intervention;  Surgeon: Wellington Hampshire, MD;  Location: [place] [facility];  Service: Cardiovascular;  Laterality: Bilateral;  iliacs     Current Meds  Medication Sig   allopurinol (ZYLOPRIM) 100 MG tablet Take 1 tablet by mouth at bedtime.    aspirin EC 81 MG tablet Take 81 mg by mouth daily.   atorvastatin (LIPITOR) 40 MG tablet Take 40 mg by mouth daily at 6 PM.    cyanocobalamin 100 MCG tablet Take 100 mcg by mouth daily.   furosemide (LASIX) 20 MG tablet Take 1 tablet (20 mg total) by mouth daily.   lisinopril (PRINIVIL,ZESTRIL) 20 MG tablet Take 20 mg by mouth at bedtime.    metoprolol tartrate (LOPRESSOR) 25 MG tablet TAKE 1 TABLET BY MOUTH TWICE A DAY   nitroGLYCERIN (NITROSTAT) 0.4 MG SL tablet Place 0.4 mg under the tongue every 5 (five) minutes as needed for chest pain.      Allergies:   Clindamycin/lincomycin   Social History   Tobacco Use   Smoking status: Former Smoker    Packs/day: 1.00    Years: 56.00    Pack years: 56.00    Types: Cigarettes   Smokeless tobacco: Never Used   Tobacco comment: "stopped smoking when I had MI in 2002"  Substance Use Topics   Alcohol use: No   Drug use: No     Family Hx: The patient's family history includes Heart failure in his father and mother. There is no history of Heart attack or Hypertension.  ROS:   Please see the history of present illness.    General:no colds or fevers, no weight changes Skin:no rashes or ulcers HEENT:no blurred vision, no congestion CV:see HPI PUL:see HPI GI:no diarrhea constipation or melena, no indigestion GU:no hematuria, no dysuria MS:no joint pain, no claudication Neuro:no syncope, no lightheadedness Endo:no diabetes, no thyroid disease  All other systems reviewed and are negative.   Prior CV studies:   The following studies were reviewed today:  Echo 12/20/17 Study Conclusions  - Left ventricle: The cavity size was normal.  There was moderate   concentric hypertrophy. Systolic function was normal. The   estimated ejection fraction was in the range of 60% to 65%. Wall   motion was normal; there were no regional wall motion   abnormalities. Doppler parameters are consistent with abnormal   left ventricular relaxation (grade 1 diastolic dysfunction). - Aortic valve: Trileaflet; mildly thickened, moderately calcified   leaflets. - Mitral valve: There was moderate regurgitation. - Left atrium: The atrium was mildly dilated. - Tricuspid valve: There was mild regurgitation. - Pulmonary arteries: Systolic pressure was mildly increased.  Carotid dopplers 12/22/10 Summary: Right Carotid: Right internal carotid artery velocities suggest 1-39% internal                carotid artery stenosis, with presence of calcific plaque.  Right vertebral artery is patent with antegrade flow.  Left Carotid: Left internal carotid artery peak systolic velocities suggest               80-99% stenosis, however end diastolic velocities and plaque               morphology is suggestive of 1-39% stenosis. Elevated peak systolic               velocities may be secondary to possible proximal stenosis.                 Left vertebral artery is patent with retrograde flow. Left               subclavian artery is patent with antegrade flow, however waveforms               are monophasic. These findings are suggestive of a possible               proximal subclavian artery stenosis.   Labs/Other Tests and Data Reviewed:    EKG:  An ECG dated 12/20/17 was personally reviewed today and demonstrated:  SR LVH  Recent Labs: 12/19/2017: B Natriuretic Peptide 322.2 05/23/2018: ALT 19; Hemoglobin 11.0; NT-Pro BNP 6,362; Platelets 192 06/26/2018: BUN 31; Creatinine, Ser 1.66; Potassium 4.4; Sodium 144   Recent Lipid Panel Lab Results  Component Value Date/Time   CHOL 142 05/23/2018 02:38 PM   TRIG 53 05/23/2018 02:38 PM    HDL 61 05/23/2018 02:38 PM   CHOLHDL 2.3 05/23/2018 02:38 PM   LDLCALC 70 05/23/2018 02:38 PM    Wt Readings from Last 3 Encounters:  07/23/18 170 lb (77.1 kg)  05/23/18 175 lb 9.6 oz (79.7 kg)  01/04/18 173 lb (78.5 kg)     Objective:    Vital Signs:  BP (!) 126/46    Pulse (!) 56    Ht 5\' 6"  (1.676 m)    Wt 170 lb (77.1 kg)    BMI 27.44 kg/m    VITAL SIGNS:  reviewed  General NAD Neuro A&O X 3 follows commands Lungs no SOB while talking, can complete sentences without SOB Psych:  Pleasant affect  ASSESSMENT & PLAN:    1. Volume overload, no SOB no edema to very mild.  On lasix 20 mg daily will continue and check BMP and [facility] [address]  2. Labile BP now more stable may be low at times. No dizziness 3. CAD no angina 4. CKD stage 4 stable  5. Some bradycardia will discuss with Dr. Tamala Julian whether to decrease lopressor.   6. Follow up with Dr. Tamala Julian in 2 months    COVID-19 Education: The signs and symptoms of COVID-19 were discussed with the patient and how to seek care for testing (follow up with PCP or arrange E-visit).  The importance of social distancing was discussed today.  Time:   Today, I have spent 15 minutes with the patient with telehealth technology discussing the above problems.     Medication Adjustments/Labs and Tests Ordered: Current medicines are reviewed at length with the patient today.  Concerns regarding medicines are outlined above.   Tests Ordered: No orders of the defined types were placed in this encounter.   Medication Changes: No orders of the defined types were placed in this encounter.   Disposition:  Follow up in 2 month(s)  Signed, Cecilie Kicks, NP  07/23/2018 11:03 AM    [place]

## 2018-07-23 ENCOUNTER — Encounter: Payer: Self-pay | Admitting: Cardiology

## 2018-07-23 ENCOUNTER — Telehealth (INDEPENDENT_AMBULATORY_CARE_PROVIDER_SITE_OTHER): Payer: Medicare Other | Admitting: Cardiology

## 2018-07-23 ENCOUNTER — Other Ambulatory Visit: Payer: Self-pay

## 2018-07-23 VITALS — BP 126/46 | HR 56 | Ht 66.0 in | Wt 170.0 lb

## 2018-07-23 DIAGNOSIS — I25709 Atherosclerosis of coronary artery bypass graft(s), unspecified, with unspecified angina pectoris: Secondary | ICD-10-CM

## 2018-07-23 DIAGNOSIS — I951 Orthostatic hypotension: Secondary | ICD-10-CM

## 2018-07-23 DIAGNOSIS — R001 Bradycardia, unspecified: Secondary | ICD-10-CM

## 2018-07-23 DIAGNOSIS — E8770 Fluid overload, unspecified: Secondary | ICD-10-CM | POA: Diagnosis not present

## 2018-07-23 DIAGNOSIS — I10 Essential (primary) hypertension: Secondary | ICD-10-CM

## 2018-07-23 DIAGNOSIS — R0602 Shortness of breath: Secondary | ICD-10-CM

## 2018-07-23 DIAGNOSIS — N184 Chronic kidney disease, stage 4 (severe): Secondary | ICD-10-CM

## 2018-07-23 NOTE — Patient Instructions (Addendum)
Medication Instructions:  Your physician recommends that you continue on your current medications as directed. Please refer to the Current Medication list given to you today. If you need a refill on your cardiac medications before your next appointment, please call your pharmacy.   Lab work: To be done this week: BNP, BMP If you have labs (blood work) drawn today and your tests are completely normal, you will receive your results only by: Marland Kitchen MyChart Message (if you have MyChart) OR . A paper copy in the mail If you have any lab test that is abnormal or we need to change your treatment, we will call you to review the results.  Testing/Procedures: None  Follow-Up: At [facility], you and your health needs are our priority.  As part of our continuing mission to provide you with exceptional heart care, we have created designated Provider Care Teams.  These Care Teams include your primary Cardiologist (physician) and Advanced Practice Providers (APPs -  Physician Assistants and Nurse Practitioners) who all work together to provide you with the care you need, when you need it. You will need a follow up appointment in 2 months. You may see Sinclair Grooms, MD or one of the following Advanced Practice Providers on your designated Care Team:   Truitt Merle, NP Cecilie Kicks, NP . Kathyrn Drown, NP  Any Other Special Instructions Will Be Listed Below (If Applicable).  Cecilie Kicks, NP will check with Dr. Tamala Julian on Monitor and any medication changes.

## 2018-07-26 ENCOUNTER — Other Ambulatory Visit: Payer: Self-pay | Admitting: *Deleted

## 2018-07-26 ENCOUNTER — Telehealth: Payer: Self-pay | Admitting: *Deleted

## 2018-07-26 DIAGNOSIS — R0602 Shortness of breath: Secondary | ICD-10-CM

## 2018-07-26 DIAGNOSIS — I10 Essential (primary) hypertension: Secondary | ICD-10-CM

## 2018-07-26 MED ORDER — METOPROLOL TARTRATE 25 MG PO TABS: 12.5000 mg | ORAL_TABLET | Freq: Two times a day (BID) | ORAL | 2 refills | Status: DC

## 2018-07-26 NOTE — Telephone Encounter (Signed)
Spoke with daughter, DPR on file.  Made her aware of recommendations per Mickel Baas and Dr. Tamala Julian.  She verbalized understanding and was appreciative for call.

## 2018-07-26 NOTE — Telephone Encounter (Signed)
-----   Message from Isaiah Serge, NP sent at 07/25/2018  9:42 AM EDT ----- Anderson Malta will you let pt and his daughter know to decrease lopressor to 12.5 mg BID and to call us next week with HR and BP.  Let them know Dr. Tamala Julian reviewed monitor and HR is a little slow. So decreasing BB will help.  I discussed this with them on visit.  Thanks. Mickel Baas  ----- Message ----- From: Belva Crome, MD Sent: 07/25/2018   9:38 AM EDT To: Lavone Orn, MD, Isaiah Serge, NP, #  Let the patient know HR relatively slow. Agree with weaning beta blocker. A copy will be sent to Lavone Orn, MD

## 2018-07-31 ENCOUNTER — Other Ambulatory Visit: Payer: Self-pay | Admitting: Cardiology

## 2018-08-01 LAB — BASIC METABOLIC PANEL
BUN/Creatinine Ratio: 18 (ref 10–24)
BUN: 31 mg/dL — ABNORMAL HIGH (ref 8–27)
CO2: 20 mmol/L (ref 20–29)
Calcium: 9.2 mg/dL (ref 8.6–10.2)
Chloride: 109 mmol/L — ABNORMAL HIGH (ref 96–106)
Creatinine, Ser: 1.76 mg/dL — ABNORMAL HIGH (ref 0.76–1.27)
GFR calc Af Amer: 39 mL/min/{1.73_m2} — ABNORMAL LOW (ref >59)
GFR calc non Af Amer: 34 mL/min/{1.73_m2} — ABNORMAL LOW (ref >59)
Glucose: 87 mg/dL (ref 65–99)
Potassium: 4.7 mmol/L (ref 3.5–5.2)
Sodium: 144 mmol/L (ref 134–144)

## 2018-08-01 LAB — PRO B NATRIURETIC PEPTIDE: NT-Pro BNP: 5901 pg/mL — ABNORMAL HIGH (ref 0–486)

## 2018-08-06 ENCOUNTER — Telehealth: Payer: Self-pay | Admitting: Interventional Cardiology

## 2018-08-06 NOTE — Telephone Encounter (Signed)
New message   Patient's daughter is calling about lab results. Please call.

## 2018-08-06 NOTE — Telephone Encounter (Signed)
Pt's daughter, [name], [place] on file, returned my call and she has been made aware of pts lab result.

## 2018-08-06 NOTE — Telephone Encounter (Signed)
-----   Message from Isaiah Serge, NP sent at 08/02/2018  1:24 PM EDT ----- How is his BP?  Is he feeling better with lower dose Lopressor.  Decrease lasix to M-W-F-Sat only.   But if we can get BP and pulse and them I may stop BB.

## 2018-08-23 ENCOUNTER — Telehealth: Payer: Self-pay | Admitting: Interventional Cardiology

## 2018-08-23 DIAGNOSIS — N184 Chronic kidney disease, stage 4 (severe): Secondary | ICD-10-CM

## 2018-08-23 DIAGNOSIS — R0602 Shortness of breath: Secondary | ICD-10-CM

## 2018-08-23 NOTE — Telephone Encounter (Signed)
Increase furosemide to 20 daily. BMET and BNP after 7 days

## 2018-08-23 NOTE — Telephone Encounter (Signed)
Spoke with daughter and made her aware of recommendations.  Daughter verbalized understanding and was appreciative for call.

## 2018-08-23 NOTE — Telephone Encounter (Signed)
Daughter is returning your call.

## 2018-08-23 NOTE — Telephone Encounter (Signed)
Left message to call back  

## 2018-08-23 NOTE — Telephone Encounter (Signed)
Pt c/o swelling: STAT is pt has developed SOB within 24 hours  1) How much weight have you gained and in what time span? 4lbs 2wks   2) If swelling, where is the swelling located? Swelling in right ankle and leg    3) Are you currently taking a fluid pill? Yes   4) Are you currently SOB? Yes, with exertion  5) Do you have a log of your daily weights (if so, list)? no  6) Have you gained 3 pounds in a day or 5 pounds in a week? No   7) Have you traveled recently? No  Patient does have leg pain as well when you walk and gets SOB. Patient daughter is wondering if fluid pill needs to be increased.

## 2018-08-23 NOTE — Telephone Encounter (Signed)
Daughter states pt has been having some swelling in his right ankle and has gained 4lbs in the last 2 weeks.  Pt gets SOB with exertion and told daughter that it seems to correlate with when he has leg pain (SOB worsens when having pain in legs).  Currently takes Furosemide 20mg  every other day.  Denies other sx.  Denies redness or heat to area. When daughter presses on swollen area, it does leave an indention. Pain in legs resolves with rest.  Daughter more concerned about SOB vs swelling.  States that pt can take a shower and is just wore out when he gets out.  Drinks 4-5 glasses of water per day.  Pt scheduled to have ABIs and aorta/IVC/Iliac dopplers performed on 7/7 and sees Dr. Fletcher Anon the same day.  Advised I would send to Dr. Tamala Julian for review.

## 2018-08-30 ENCOUNTER — Telehealth: Payer: Self-pay | Admitting: Interventional Cardiology

## 2018-08-30 ENCOUNTER — Other Ambulatory Visit: Payer: Self-pay | Admitting: Interventional Cardiology

## 2018-08-30 NOTE — Telephone Encounter (Signed)
Increase to 40 mg daily thru [facility] including Monday.

## 2018-08-30 NOTE — Telephone Encounter (Signed)
Daughter states that 3 days ago pt's weight was 170lbs.  Over the last 3 days he has been 172.8.  Denies increased salt in diet.  SOB is unchanged from last call.  Daughter noticed ankles are a little more swollen and one has a slight red tint to it.  BP and HR have been fine, 130-140s/60-70s. Pt denies any pain.  Pt has been up a lot doing different things.  Daughter had him rest and elevate his legs just prior to calling.  Pt is going to [facility] on Tuesday to have BMET and BNP drawn since Furosemide was recently increased to 20mg  QD (previously took 3x/wk).  Advised I will send to Dr. Tamala Julian for review.

## 2018-08-30 NOTE — Telephone Encounter (Signed)
New Message    Pt c/o swelling: STAT is pt has developed SOB within 24 hours  1) How much weight have you gained and in what time span? Gained 2lbs, past three days he has been at 172.  2) If swelling, where is the swelling located? Feet and legs (looks reddish)  3) Are you currently taking a fluid pill? yes  4) Are you currently SOB? no  5) Do you have a log of your daily weights (if so, list)? Past 3 days 172  6) Have you gained 3 pounds in a day or 5 pounds in a week? no  7) Have you traveled recently? No

## 2018-08-30 NOTE — Telephone Encounter (Signed)
Spoke with daughter and went over recommendations.  Daughter verbalized understanding and was in agreement with plan.

## 2018-09-04 ENCOUNTER — Other Ambulatory Visit: Payer: Self-pay | Admitting: *Deleted

## 2018-09-04 ENCOUNTER — Telehealth: Payer: Self-pay | Admitting: *Deleted

## 2018-09-04 DIAGNOSIS — R0602 Shortness of breath: Secondary | ICD-10-CM

## 2018-09-04 DIAGNOSIS — N184 Chronic kidney disease, stage 4 (severe): Secondary | ICD-10-CM

## 2018-09-04 LAB — BASIC METABOLIC PANEL
BUN/Creatinine Ratio: 17 (ref 10–24)
BUN: 37 mg/dL — ABNORMAL HIGH (ref 8–27)
CO2: 21 mmol/L (ref 20–29)
Calcium: 9.1 mg/dL (ref 8.6–10.2)
Chloride: 104 mmol/L (ref 96–106)
Creatinine, Ser: 2.12 mg/dL — ABNORMAL HIGH (ref 0.76–1.27)
GFR calc Af Amer: 31 mL/min/{1.73_m2} — ABNORMAL LOW (ref >59)
GFR calc non Af Amer: 27 mL/min/{1.73_m2} — ABNORMAL LOW (ref >59)
Glucose: 94 mg/dL (ref 65–99)
Potassium: 4.9 mmol/L (ref 3.5–5.2)
Sodium: 141 mmol/L (ref 134–144)

## 2018-09-04 LAB — PRO B NATRIURETIC PEPTIDE: NT-Pro BNP: 10929 pg/mL — ABNORMAL HIGH (ref 0–486)

## 2018-09-04 MED ORDER — FUROSEMIDE 40 MG PO TABS: 40.0000 mg | ORAL_TABLET | Freq: Every day | ORAL | 3 refills | Status: DC

## 2018-09-04 NOTE — Telephone Encounter (Signed)
Spoke with pt's daughter, DPR on file.  Went over results and recommendations.  Daughter states that SOB is not improved and weight has only gone down about 2lbs.  Most recent weight is 171lbs.  Dr. Tamala Julian feels pt needs to continue Furosemide 40mg  QD and recheck BMET in a week.  Daughter in agreement with plan.  She will take pt to local [facility].

## 2018-09-04 NOTE — Telephone Encounter (Signed)
-----   Message from Belva Crome, MD sent at 09/04/2018  1:34 PM EDT ----- Let the patient know let them know his kidney numbers are higher than prior values.  He has stage III chronic kidney disease.  If fluid retention and shortness of breath are better, I would continue furosemide at 20 mg/day.  If swelling is not better, he may need to have an even higher dose of furosemide.  Otherwise A copy will be sent to Lavone Orn, MD

## 2018-09-11 ENCOUNTER — Other Ambulatory Visit: Payer: Self-pay | Admitting: Interventional Cardiology

## 2018-09-11 MED ORDER — METOPROLOL TARTRATE 25 MG PO TABS: 12.5000 mg | ORAL_TABLET | Freq: Two times a day (BID) | ORAL | 2 refills | Status: DC

## 2018-09-11 NOTE — Telephone Encounter (Signed)
Pt's medication was sent to pt's pharmacy as requested. Confirmation received.  °

## 2018-09-12 NOTE — Telephone Encounter (Signed)
Follow up   Patient's daughter would like a call about lab results this afternoon. He will going for lab work today at 1:00pm.  Please call.

## 2018-09-12 NOTE — Telephone Encounter (Signed)
Spoke with daughter and she wanted to let us know that pt is going to [facility] today at 1pm to get labs drawn.  She said she realized we are likely closed tomorrow and was concerned about what to do about meds.  Advised to continue current regimen until she hears from our office.  She mentioned swelling is better but pt does still have some edema in left ankle. Weight is down to 168lbs from 171.  Pt still gets very SOB with walking which concerns him.  Daughter states breathing was worse yesterday but has improved today.  Advised I will send update to Dr. Tamala Julian so he is aware for when labs come through.

## 2018-09-13 LAB — BASIC METABOLIC PANEL
BUN/Creatinine Ratio: 19 (ref 10–24)
BUN: 41 mg/dL — ABNORMAL HIGH (ref 8–27)
CO2: 18 mmol/L — ABNORMAL LOW (ref 20–29)
Calcium: 8.9 mg/dL (ref 8.6–10.2)
Chloride: 106 mmol/L (ref 96–106)
Creatinine, Ser: 2.14 mg/dL — ABNORMAL HIGH (ref 0.76–1.27)
GFR calc Af Amer: 31 mL/min/{1.73_m2} — ABNORMAL LOW (ref >59)
GFR calc non Af Amer: 26 mL/min/{1.73_m2} — ABNORMAL LOW (ref >59)
Glucose: 112 mg/dL — ABNORMAL HIGH (ref 65–99)
Potassium: 4.7 mmol/L (ref 3.5–5.2)
Sodium: 146 mmol/L — ABNORMAL HIGH (ref 134–144)

## 2018-09-13 NOTE — Telephone Encounter (Signed)
Discussed with the daughter. Needs labs in 1 month: CMET, BNP, CBC, and TSH.

## 2018-09-16 ENCOUNTER — Telehealth: Payer: Self-pay | Admitting: *Deleted

## 2018-09-16 NOTE — Addendum Note (Signed)
Addended by: Loren Racer on: 09/16/2018 09:36 AM   Modules accepted: Orders

## 2018-09-16 NOTE — Telephone Encounter (Signed)
The patient's daughter has been called about the appointments tomorrow for her father. She will need to be with her father for the appointment with Dr. Fletcher Anon at 11:20. She has been advised that they will need to wear masks.      COVID-19 Pre-Screening Questions:  . In the past 7 to 10 days have you had a cough,  shortness of breath, headache, congestion, fever (100 or greater) body aches, chills, sore throat, or sudden loss of taste or sense of smell? No . Have you been around anyone with known Covid 19. No . Have you been around anyone who is awaiting Covid 19 test results in the past 7 to 10 days? No . Have you been around anyone who has been exposed to Covid 19, or has mentioned symptoms of Covid 19 within the past 7 to 10 days? No  If you have any concerns/questions about symptoms patients report during screening (either on the phone or at threshold). Contact the provider seeing the patient or DOD for further guidance.  If neither are available contact a member of the leadership team.

## 2018-09-17 ENCOUNTER — Other Ambulatory Visit: Payer: Self-pay

## 2018-09-17 ENCOUNTER — Ambulatory Visit (HOSPITAL_BASED_OUTPATIENT_CLINIC_OR_DEPARTMENT_OTHER)
Admission: RE | Admit: 2018-09-17 | Discharge: 2018-09-17 | Disposition: A | Discharge status: Home / Self Care | Payer: Medicare Other | Source: Ambulatory Visit | Attending: Cardiovascular Disease | Admitting: Cardiovascular Disease

## 2018-09-17 ENCOUNTER — Ambulatory Visit (INDEPENDENT_AMBULATORY_CARE_PROVIDER_SITE_OTHER): Payer: Medicare Other | Admitting: Cardiovascular Disease

## 2018-09-17 ENCOUNTER — Other Ambulatory Visit: Payer: Self-pay | Admitting: Cardiovascular Disease

## 2018-09-17 ENCOUNTER — Ambulatory Visit (HOSPITAL_COMMUNITY)
Admission: RE | Admit: 2018-09-17 | Discharge: 2018-09-17 | Disposition: A | Discharge status: Home / Self Care | Payer: Medicare Other | Source: Ambulatory Visit | Attending: Cardiology | Admitting: Cardiology

## 2018-09-17 VITALS — BP 146/66 | HR 69 | Temp 98.2°F | Ht 66.0 in | Wt 168.5 lb

## 2018-09-17 DIAGNOSIS — I739 Peripheral vascular disease, unspecified: Secondary | ICD-10-CM

## 2018-09-17 DIAGNOSIS — I779 Disorder of arteries and arterioles, unspecified: Secondary | ICD-10-CM

## 2018-09-17 DIAGNOSIS — I2510 Atherosclerotic heart disease of native coronary artery without angina pectoris: Secondary | ICD-10-CM

## 2018-09-17 DIAGNOSIS — I10 Essential (primary) hypertension: Secondary | ICD-10-CM | POA: Diagnosis not present

## 2018-09-17 NOTE — Progress Notes (Addendum)
Cardiology Office Note   Date:  09/17/2018   ID:  Danny [name], DOB Jul 31, 1926, MRN [id number]  PCP:  Lavone Orn, MD  Cardiologist: Dr. Tamala Julian  No chief complaint on file.     History of Present Illness: Danny [name] is a 85 y.o. male who is here today for follow-up visit regarding  peripheral arterial disease. He has known history of coronary artery disease status post CABG more than 10 years ago. He had bilateral carotid endarterectomy, chronic kidney disease, hypertension, bilateral common iliac artery stenting and hyperlipidemia. He quit smoking more than 20 years ago.  He underwent bilateral kissing stent placement to the common iliac arteries in May of 2016 for severe claudication.  He is known to have restenosis in the iliac stents with gradual worsening of bilateral leg claudication.  However, most recently he has been been dealing with worsening shortness of breath likely due to diastolic heart failure.  He did have significant leg edema that improved with increasing the dose of furosemide.  His weight went down 7 pounds since March.  He is more limited by shortness of breath and claudication.  He denies chest pain.    Past Medical History:  Diagnosis Date  . Basal cell carcinoma of nose    "right side; burned it off"  . Carotid artery disease ([place])    a. s/p prior carotid endarterectomy.  . Cerebrovascular disease   . Chronic kidney disease (CKD), stage III (moderate) (HCC)   . Coronary artery disease   . ED (erectile dysfunction)   . History of gout   . Hyperlipidemia   . Hypertension   . Insomnia   . Myocardial infarction ([place]) 2002  . PVD (peripheral vascular disease) ([place])    a. s/p right and left common iliac artery angioplasty with bilateral stent placement.  . Syncope   . Unequal blood pressure in upper extremities     Past Surgical History:  Procedure Laterality Date  . ABDOMINAL HERNIA REPAIR  X 4  . APPENDECTOMY    . CAROTID ENDARTERECTOMY  Bilateral   . CORONARY ANGIOPLASTY WITH STENT PLACEMENT     "~ 6 months before bypass"  . CORONARY ARTERY BYPASS GRAFT  2002   "CABG X4"  . HERNIA REPAIR    . PERIPHERAL VASCULAR CATHETERIZATION N/A 08/05/2014   Procedure: Abdominal Aortogram;  Surgeon: Wellington Hampshire, MD;  Location: [facility];  Service: Cardiovascular;  Laterality: N/A;  . PERIPHERAL VASCULAR CATHETERIZATION Bilateral 08/05/2014   Procedure: Peripheral Vascular Intervention;  Surgeon: Wellington Hampshire, MD;  Location: [facility];  Service: Cardiovascular;  Laterality: Bilateral;  iliacs     Current Outpatient Medications  Medication Sig Dispense Refill  . allopurinol (ZYLOPRIM) 100 MG tablet Take 1 tablet by mouth at bedtime.     Marland Kitchen aspirin EC 81 MG tablet Take 81 mg by mouth daily.    Marland Kitchen atorvastatin (LIPITOR) 40 MG tablet Take 40 mg by mouth daily at 6 PM.     . cyanocobalamin 100 MCG tablet Take 100 mcg by mouth daily.    . furosemide (LASIX) 40 MG tablet Take 1 tablet (40 mg total) by mouth daily. 90 tablet 3  . lisinopril (PRINIVIL,ZESTRIL) 20 MG tablet Take 20 mg by mouth at bedtime.     . metoprolol tartrate (LOPRESSOR) 25 MG tablet Take 0.5 tablets (12.5 mg total) by mouth 2 (two) times daily. 90 tablet 2  . nitroGLYCERIN (NITROSTAT) 0.4 MG SL tablet Place  0.4 mg under the tongue every 5 (five) minutes as needed for chest pain.      No current facility-administered medications for this visit.     Allergies:   Clindamycin/lincomycin    Social History:  The patient  reports that he has quit smoking. His smoking use included cigarettes. He has a 56.00 pack-year smoking history. He has never used smokeless tobacco. He reports that he does not drink alcohol or use drugs.   Family History:  The patient's family history includes Heart failure in his father and mother.    ROS:  Please see the history of present illness.   Otherwise, review of systems are positive for discoloration and lower  extremity. Family also suggested there is diminished cognitive function.   All other systems are reviewed and negative.    PHYSICAL EXAM: VS:  BP (!) 146/66   Pulse 69   Temp 98.2 F (36.8 C)   Ht 5\' 6"  (1.676 m)   Wt 168 lb 8 oz (76.4 kg)   BMI 27.20 kg/m  , BMI Body mass index is 27.2 kg/m. GEN: Well nourished, well developed, in no acute distress  HEENT: normal  Neck: no JVD, carotid bruits, or masses. Right carotid bruit is heard. Cardiac: RRR; rubs, or gallops,no edema . Respiratory:  clear to auscultation bilaterally, normal work of breathing GI: soft, nontender, nondistended, + BS MS: no deformity or atrophy  Skin: warm and dry, no rash Neuro:  Strength and sensation are intact Psych: euthymic mood, full affect Vascular: Femoral pulses are +1 on the right and +2 on the left.  EKG was performed and showed normal sinus rhythm with anterolateral and inferior T wave changes suggestive of ischemia.     Recent Labs: 12/19/2017: B Natriuretic Peptide 322.2 05/23/2018: ALT 19; Hemoglobin 11.0; Platelets 192 09/03/2018: NT-Pro BNP 10,929 09/12/2018: BUN 41; Creatinine, Ser 2.14; Potassium 4.7; Sodium 146    Lipid Panel    Component Value Date/Time   CHOL 142 05/23/2018 1438   TRIG 53 05/23/2018 1438   HDL 61 05/23/2018 1438   CHOLHDL 2.3 05/23/2018 1438   LDLCALC 70 05/23/2018 1438      Wt Readings from Last 3 Encounters:  09/17/18 168 lb 8 oz (76.4 kg)  07/23/18 170 lb (77.1 kg)  05/23/18 175 lb 9.6 oz (79.7 kg)        ASSESSMENT AND PLAN:  1. Peripheral arterial disease: He continues to have moderate bilateral leg claudication due to known in-stent restenosis in bilateral common iliac arteries.  We repeated his vascular studies today which showed slight improvement in ABI bilaterally to the 0.7 range.  Duplex showed stable velocities.   However, he does have elevated velocity in the mid aorta below the renal arteries which might be responsible for some of his  worsening claudication. I think he is more limited by shortness of breath and claudication.  Due to that and considering his age and underlying chronic kidney disease, I recommend continuing medical therapy and observation for now.  2. Atherosclerosis of native coronary artery of native heart with other form of angina pectoris: Recent worsening of dyspnea likely due to diastolic heart failure: Improved with furosemide but continues to be very limited.  3. Bilateral carotid stenosis: Most recent carotid Doppler showed 40-59% bilateral ICA stenosis.  Repeat study in October 2019.  4. Essential hypertension: Blood pressure is controlled on current medications.  5.  Hyperlipidemia: Currently on atorvastatin 40 mg daily.   Follow-up with me in 6 months.  Signed,  Kathlyn Sacramento, MD  09/17/2018 11:08 AM

## 2018-09-17 NOTE — Patient Instructions (Signed)
Medication Instructions:  The current medical regimen is effective;  continue present plan and medications.  If you need a refill on your cardiac medications before your next appointment, please call your pharmacy.   Follow-Up: At [facility], you and your health needs are our priority.  As part of our continuing mission to provide you with exceptional heart care, we have created designated Provider Care Teams.  These Care Teams include your primary Cardiologist (physician) and Advanced Practice Providers (APPs -  Physician Assistants and Nurse Practitioners) who all work together to provide you with the care you need, when you need it. You will need a follow up appointment in 6 months.  Please call our office 2 months in advance to schedule this appointment.  You may see Dr.Arida or one of the following Advanced Practice Providers on your designated Care Team:   Kerin Ransom, PA-C Roby Lofts, [place] . Sande Rives, PA-C

## 2018-09-23 ENCOUNTER — Telehealth: Payer: Self-pay | Admitting: Interventional Cardiology

## 2018-09-23 NOTE — Telephone Encounter (Signed)
Continue lasix daily. Weight daily. Let us know if weight or BP continue dropping. BP < 337 mmHg systolic is too low. BMET 7-10 days after most recent.

## 2018-09-23 NOTE — Telephone Encounter (Signed)
I spoke to patient's daughter with Dr Thompson Caul recommendations.  She verbalized understanding.

## 2018-09-23 NOTE — Telephone Encounter (Signed)
I spoke to the patient's daughter [name]) who called to let us know that the patient is doing so much better since starting the Lasix 40 mg.  His weight has dropped and he feels good.  She was wondering exactly when Dr Tamala Julian would like the repeat labs drawn and if the patient should continue the Lasix daily.

## 2018-09-23 NOTE — Telephone Encounter (Signed)
New message:    Patient daughter calling they need a lab order for [facility] if it has not been yet. Patient daughter some questions as well concering some medication,

## 2018-09-25 ENCOUNTER — Other Ambulatory Visit: Payer: Self-pay | Admitting: Interventional Cardiology

## 2018-09-26 LAB — CBC
Hematocrit: 29.1 % — ABNORMAL LOW (ref 37.5–51.0)
Hemoglobin: 10.5 g/dL — ABNORMAL LOW (ref 13.0–17.7)
MCH: 34.7 pg — ABNORMAL HIGH (ref 26.6–33.0)
MCHC: 36.1 g/dL — ABNORMAL HIGH (ref 31.5–35.7)
MCV: 96 fL (ref 79–97)
Platelets: 156 10*3/uL (ref 150–450)
RBC: 3.03 x10E6/uL — ABNORMAL LOW (ref 4.14–5.80)
RDW: 13.3 % (ref 11.6–15.4)
WBC: 6.9 10*3/uL (ref 3.4–10.8)

## 2018-09-26 LAB — BASIC METABOLIC PANEL
BUN/Creatinine Ratio: 18 (ref 10–24)
BUN: 39 mg/dL — ABNORMAL HIGH (ref 8–27)
CO2: 21 mmol/L (ref 20–29)
Calcium: 9.2 mg/dL (ref 8.6–10.2)
Chloride: 101 mmol/L (ref 96–106)
Creatinine, Ser: 2.16 mg/dL — ABNORMAL HIGH (ref 0.76–1.27)
GFR calc Af Amer: 30 mL/min/{1.73_m2} — ABNORMAL LOW (ref >59)
GFR calc non Af Amer: 26 mL/min/{1.73_m2} — ABNORMAL LOW (ref >59)
Glucose: 99 mg/dL (ref 65–99)
Potassium: 4.8 mmol/L (ref 3.5–5.2)
Sodium: 139 mmol/L (ref 134–144)

## 2018-09-26 LAB — HEPATIC FUNCTION PANEL
ALT: 28 IU/L (ref 0–44)
AST: 21 IU/L (ref 0–40)
Albumin: 4.4 g/dL (ref 3.6–4.6)
Alkaline Phosphatase: 94 IU/L (ref 39–117)
Bilirubin Total: 0.8 mg/dL (ref 0.0–1.2)
Bilirubin, Direct: 0.26 mg/dL (ref 0.00–0.40)
Total Protein: 6.9 g/dL (ref 6.0–8.5)

## 2018-09-26 LAB — PRO B NATRIURETIC PEPTIDE: NT-Pro BNP: 10644 pg/mL — ABNORMAL HIGH (ref 0–486)

## 2018-09-26 LAB — TSH: TSH: 4.33 u[IU]/mL (ref 0.450–4.500)

## 2018-09-27 ENCOUNTER — Telehealth: Payer: Self-pay | Admitting: Interventional Cardiology

## 2018-09-27 NOTE — Telephone Encounter (Signed)
New message:    Patient daughter returning call back concering lab results. Please call back.

## 2018-09-27 NOTE — Telephone Encounter (Signed)
  Daughter is calling to get lab results for her dad.

## 2018-09-27 NOTE — Telephone Encounter (Signed)
I had MR scan [facility] lab results from 09/25/2018  into system for Dr Tamala Julian to review and to advise.

## 2018-09-27 NOTE — Telephone Encounter (Signed)
I spoke to the patient's daughter [name]) and informed her that the labs have not resulted yet.  She said that they were drawn on [place] [facility].

## 2018-09-30 ENCOUNTER — Telehealth: Payer: Self-pay | Admitting: Interventional Cardiology

## 2018-09-30 NOTE — Telephone Encounter (Signed)
  Daughter is calling to get lab results for her dad

## 2018-09-30 NOTE — Telephone Encounter (Signed)
Spoke with daughter and reviewed results.  Scheduled pt to see Dr. Tamala Julian 10/30/2018.

## 2018-10-30 ENCOUNTER — Encounter: Payer: Self-pay | Admitting: Interventional Cardiology

## 2018-10-30 ENCOUNTER — Ambulatory Visit (INDEPENDENT_AMBULATORY_CARE_PROVIDER_SITE_OTHER): Payer: Medicare Other | Admitting: Interventional Cardiology

## 2018-10-30 ENCOUNTER — Other Ambulatory Visit: Payer: Self-pay

## 2018-10-30 VITALS — BP 98/44 | HR 70 | Ht 66.0 in | Wt 167.0 lb

## 2018-10-30 DIAGNOSIS — I10 Essential (primary) hypertension: Secondary | ICD-10-CM | POA: Diagnosis not present

## 2018-10-30 DIAGNOSIS — I2510 Atherosclerotic heart disease of native coronary artery without angina pectoris: Secondary | ICD-10-CM | POA: Diagnosis not present

## 2018-10-30 DIAGNOSIS — Z7189 Other specified counseling: Secondary | ICD-10-CM

## 2018-10-30 DIAGNOSIS — N184 Chronic kidney disease, stage 4 (severe): Secondary | ICD-10-CM

## 2018-10-30 DIAGNOSIS — I739 Peripheral vascular disease, unspecified: Secondary | ICD-10-CM

## 2018-10-30 DIAGNOSIS — I5033 Acute on chronic diastolic (congestive) heart failure: Secondary | ICD-10-CM

## 2018-10-30 DIAGNOSIS — E7849 Other hyperlipidemia: Secondary | ICD-10-CM

## 2018-10-30 DIAGNOSIS — I779 Disorder of arteries and arterioles, unspecified: Secondary | ICD-10-CM

## 2018-10-30 NOTE — Progress Notes (Signed)
Cardiology Office Note:    Date:  10/30/2018   ID:  Danny [name], DOB 1926-10-11, MRN [id number]  PCP:  Lavone Orn, MD  Cardiologist:  Sinclair Grooms, MD   Referring MD: Lavone Orn, MD   Chief Complaint  Patient presents with  . Congestive Heart Failure  . Coronary Artery Disease    History of Present Illness:    Danny [name] is a 85 y.o. male with a hx of  coronary arterydisease and prior coronarybypass (LIMA to LAD, SVG to ramus, SVG to obtuse marginal, and SVG to PDA. 2002), hypertension, hyperlipidemia, PADwith bilateral iliacstent procedure2016 by Dr. Fletcher Anon, carotid disease(bilat CEA), and chronic kidney disease.  More recently, to be an acute on chronic diastolic heart failure.  Here today for follow-up after significant increase in diuretic regimen as outpatient currently taking 40 mg/day.  Better since we increased furosemide to 40 mg daily.  Lower extremity swelling has decreased but not completely resolved.  Orthopnea has resolved.  He does have dyspnea on exertion.  He otherwise has no specific complaints.  He gets winded if he walks too far.  He gets claudication if he walks too far.  Past Medical History:  Diagnosis Date  . Basal cell carcinoma of nose    "right side; burned it off"  . Carotid artery disease ([place])    a. s/p prior carotid endarterectomy.  . Cerebrovascular disease   . Chronic kidney disease (CKD), stage III (moderate) (HCC)   . Coronary artery disease   . ED (erectile dysfunction)   . History of gout   . Hyperlipidemia   . Hypertension   . Insomnia   . Myocardial infarction ([place]) 2002  . PVD (peripheral vascular disease) ([place])    a. s/p right and left common iliac artery angioplasty with bilateral stent placement.  . Syncope   . Unequal blood pressure in upper extremities     Past Surgical History:  Procedure Laterality Date  . ABDOMINAL HERNIA REPAIR  X 4  . APPENDECTOMY    . CAROTID ENDARTERECTOMY Bilateral   . CORONARY  ANGIOPLASTY WITH STENT PLACEMENT     "~ 6 months before bypass"  . CORONARY ARTERY BYPASS GRAFT  2002   "CABG X4"  . HERNIA REPAIR    . PERIPHERAL VASCULAR CATHETERIZATION N/A 08/05/2014   Procedure: Abdominal Aortogram;  Surgeon: Wellington Hampshire, MD;  Location: [facility];  Service: Cardiovascular;  Laterality: N/A;  . PERIPHERAL VASCULAR CATHETERIZATION Bilateral 08/05/2014   Procedure: Peripheral Vascular Intervention;  Surgeon: Wellington Hampshire, MD;  Location: [facility] [place] [facility];  Service: Cardiovascular;  Laterality: Bilateral;  iliacs    Current Medications: Current Meds  Medication Sig  . allopurinol (ZYLOPRIM) 100 MG tablet Take 1 tablet by mouth at bedtime.   Marland Kitchen aspirin EC 81 MG tablet Take 81 mg by mouth daily.  Marland Kitchen atorvastatin (LIPITOR) 40 MG tablet Take 40 mg by mouth daily at 6 PM.   . cyanocobalamin 100 MCG tablet Take 100 mcg by mouth daily.  . furosemide (LASIX) 40 MG tablet Take 1 tablet (40 mg total) by mouth daily.  Marland Kitchen lisinopril (PRINIVIL,ZESTRIL) 20 MG tablet Take 20 mg by mouth at bedtime.   . metoprolol tartrate (LOPRESSOR) 25 MG tablet Take 0.5 tablets (12.5 mg total) by mouth 2 (two) times daily.  . nitroGLYCERIN (NITROSTAT) 0.4 MG SL tablet Place 0.4 mg under the tongue every 5 (five) minutes as needed for chest pain.  Allergies:   Clindamycin/lincomycin   Social History   Socioeconomic History  . Marital status: Widowed    Spouse name: Not on file  . Number of children: Not on file  . Years of education: Not on file  . Highest education level: Not on file  Occupational History  . Not on file  Social Needs  . Financial resource strain: Not on file  . Food insecurity    Worry: Not on file    Inability: Not on file  . Transportation needs    Medical: Not on file    Non-medical: Not on file  Tobacco Use  . Smoking status: Former Smoker    Packs/day: 1.00    Years: 56.00    Pack years: 56.00    Types: Cigarettes  . Smokeless tobacco:  Never Used  . Tobacco comment: "stopped smoking when I had MI in 2002"  Substance and Sexual Activity  . Alcohol use: No  . Drug use: No  . Sexual activity: Not on file  Lifestyle  . Physical activity    Days per week: Not on file    Minutes per session: Not on file  . Stress: Not on file  Relationships  . Social Herbalist on phone: Not on file    Gets together: Not on file    Attends religious service: Not on file    Active member of club or organization: Not on file    Attends meetings of clubs or organizations: Not on file    Relationship status: Not on file  Other Topics Concern  . Not on file  Social History Narrative  . Not on file     Family History: The patient's family history includes Heart failure in his father and mother. There is no history of Heart attack or Hypertension.  ROS:   Please see the history of present illness.    The appetite is stable.  He has not lost weight.  All other systems reviewed and are negative.  EKGs/Labs/Other Studies Reviewed:    The following studies were reviewed today: Echocardiogram 2019, October: ------------------------------------------------------------------- Study Conclusions  - Left ventricle: The cavity size was normal. There was moderate   concentric hypertrophy. Systolic function was normal. The   estimated ejection fraction was in the range of 60% to 65%. Wall   motion was normal; there were no regional wall motion   abnormalities. Doppler parameters are consistent with abnormal   left ventricular relaxation (grade 1 diastolic dysfunction). - Aortic valve: Trileaflet; mildly thickened, moderately calcified   leaflets. - Mitral valve: There was moderate regurgitation. - Left atrium: The atrium was mildly dilated. - Tricuspid valve: There was mild regurgitation. - Pulmonary arteries: Systolic pressure was mildly increased.  EKG:  EKG repeated  Recent Labs: 12/19/2017: B Natriuretic Peptide 322.2  09/25/2018: ALT 28; BUN 39; Creatinine, Ser 2.16; Hemoglobin 10.5; NT-Pro BNP 10,644; Platelets 156; Potassium 4.8; Sodium 139; TSH 4.330  Recent Lipid Panel    Component Value Date/Time   CHOL 142 05/23/2018 1438   TRIG 53 05/23/2018 1438   HDL 61 05/23/2018 1438   CHOLHDL 2.3 05/23/2018 1438   LDLCALC 70 05/23/2018 1438    Physical Exam:    VS:  BP (!) 98/44   Pulse 70   Ht '5\' 6"'$  (1.676 m)   Wt 167 lb (75.8 kg)   SpO2 96%   BMI 26.95 kg/m     Wt Readings from Last 3 Encounters:  10/30/18 167 lb (75.8  kg)  09/17/18 168 lb 8 oz (76.4 kg)  07/23/18 170 lb (77.1 kg)     GEN: Elderly but appears younger than 85 years of age.. No acute distress HEENT: Normal NECK: No JVD. LYMPHATICS: No lymphadenopathy CARDIAC:  RRR without murmur, gallop, or edema. VASCULAR:  Normal Pulses. No bruits. RESPIRATORY:  Clear to auscultation without rales, wheezing or rhonchi  ABDOMEN: Soft, non-tender, non-distended, No pulsatile mass, MUSCULOSKELETAL: No deformity  SKIN: Warm and dry NEUROLOGIC:  Alert and oriented x 3 PSYCHIATRIC:  Normal affect   ASSESSMENT:    1. Acute on chronic diastolic heart failure ([place])   2. Coronary artery disease involving native coronary artery of native heart without angina pectoris   3. Essential hypertension   4. PAD (peripheral artery disease) (HCC)   5. Carotid artery disease, unspecified laterality ([place])   6. CKD (chronic kidney disease) stage 4, GFR 15-29 ml/min (HCC)   7. Other hyperlipidemia   8. Educated About Covid-19 Virus Infection    PLAN:    In order of problems listed above:  1. Seems improved and certainly has lost fluid based upon his weights from home, significant improvement of edema, and reduction in abdominal girth.  We will check a C met and BNP today.  A 2D Doppler echocardiogram will be repeated to exclude right heart failure. 2. No symptoms to suggest unstable angina. 3. Blood pressure is relatively low limiting our opportunity  to be more aggressive with diuretic therapy.  I personally repeated the blood pressure and it was 108/50 mmHg. 4. Claudication is stable 5. Did not address carotid disease 6. Creatinine in July was 2.16.  C met is obtained today. 7. Did not address lipids 8. Social distancing, masking, and handwashing is discussed in detail.  Follow-up   Medication Adjustments/Labs and Tests Ordered: Current medicines are reviewed at length with the patient today.  Concerns regarding medicines are outlined above.  Orders Placed This Encounter  Procedures  . Basic metabolic panel  . Hepatic function panel  . Pro b natriuretic peptide  . EKG 12-Lead  . ECHOCARDIOGRAM COMPLETE   No orders of the defined types were placed in this encounter.   There are no Patient Instructions on file for this visit.   Signed, Sinclair Grooms, MD  10/30/2018 3:56 PM    [place]

## 2018-10-30 NOTE — Patient Instructions (Signed)
Medication Instructions:  Your physician recommends that you continue on your current medications as directed. Please refer to the Current Medication list given to you today.  If you need a refill on your cardiac medications before your next appointment, please call your pharmacy.   Lab work: BMET, Liver and Pro BNP today  If you have labs (blood work) drawn today and your tests are completely normal, you will receive your results only by: Marland Kitchen MyChart Message (if you have MyChart) OR . A paper copy in the mail If you have any lab test that is abnormal or we need to change your treatment, we will call you to review the results.  Testing/Procedures: Your physician has requested that you have an echocardiogram. Echocardiography is a painless test that uses sound waves to create images of your heart. It provides your doctor with information about the size and shape of your heart and how well your heart's chambers and valves are working. This procedure takes approximately one hour. There are no restrictions for this procedure.   Follow-Up: At [facility], you and your health needs are our priority.  As part of our continuing mission to provide you with exceptional heart care, we have created designated Provider Care Teams.  These Care Teams include your primary Cardiologist (physician) and Advanced Practice Providers (APPs -  Physician Assistants and Nurse Practitioners) who all work together to provide you with the care you need, when you need it. You will need a follow up appointment in 3 months.  Please call our office 2 months in advance to schedule this appointment.  You may see Sinclair Grooms, MD or one of the following Advanced Practice Providers on your designated Care Team:   Truitt Merle, NP Cecilie Kicks, NP . Kathyrn Drown, NP  Any Other Special Instructions Will Be Listed Below (If Applicable).

## 2018-10-31 LAB — BASIC METABOLIC PANEL
BUN/Creatinine Ratio: 17 (ref 10–24)
BUN: 38 mg/dL — ABNORMAL HIGH (ref 10–36)
CO2: 22 mmol/L (ref 20–29)
Calcium: 9.3 mg/dL (ref 8.6–10.2)
Chloride: 105 mmol/L (ref 96–106)
Creatinine, Ser: 2.2 mg/dL — ABNORMAL HIGH (ref 0.76–1.27)
GFR calc Af Amer: 29 mL/min/{1.73_m2} — ABNORMAL LOW (ref >59)
GFR calc non Af Amer: 25 mL/min/{1.73_m2} — ABNORMAL LOW (ref >59)
Glucose: 127 mg/dL — ABNORMAL HIGH (ref 65–99)
Potassium: 4.6 mmol/L (ref 3.5–5.2)
Sodium: 143 mmol/L (ref 134–144)

## 2018-10-31 LAB — HEPATIC FUNCTION PANEL
ALT: 19 IU/L (ref 0–44)
AST: 17 IU/L (ref 0–40)
Albumin: 4.1 g/dL (ref 3.5–4.6)
Alkaline Phosphatase: 92 IU/L (ref 39–117)
Bilirubin Total: 0.7 mg/dL (ref 0.0–1.2)
Bilirubin, Direct: 0.25 mg/dL (ref 0.00–0.40)
Total Protein: 6.4 g/dL (ref 6.0–8.5)

## 2018-10-31 LAB — PRO B NATRIURETIC PEPTIDE: NT-Pro BNP: 12982 pg/mL — ABNORMAL HIGH (ref 0–486)

## 2018-11-06 ENCOUNTER — Other Ambulatory Visit: Payer: Self-pay

## 2018-11-06 ENCOUNTER — Ambulatory Visit (HOSPITAL_COMMUNITY): Payer: Medicare Other | Attending: Cardiovascular Disease

## 2018-11-06 DIAGNOSIS — I5033 Acute on chronic diastolic (congestive) heart failure: Secondary | ICD-10-CM | POA: Diagnosis present

## 2018-11-08 ENCOUNTER — Telehealth: Payer: Self-pay | Admitting: Interventional Cardiology

## 2018-11-08 NOTE — Telephone Encounter (Signed)
New message:      Patient daughter calling for the results of her father's ECHO results. Please call back.

## 2018-11-08 NOTE — Telephone Encounter (Signed)
I spoke to the patient's daughter who is patiently awaiting her father's Echo results.  Thank you.

## 2018-11-11 MED ORDER — CARVEDILOL 3.125 MG PO TABS: 3.1250 mg | ORAL_TABLET | Freq: Two times a day (BID) | ORAL | 3 refills | Status: AC

## 2018-11-11 NOTE — Telephone Encounter (Signed)
New Message:     Patient daughter calling stating stating that some one was going to call her Friday concering her father's. Please call patient daughter.

## 2018-11-11 NOTE — Telephone Encounter (Signed)
Notes recorded by Belva Crome, MD on 11/11/2018 at 2:59 PM EDT  Let the patient know the heart has gotten much weaker. Stop metoprolol and start Carvedilol 3.125 mg po BID. How is he doing? May need to switch to [facility], but should be after carvedilol switch.Damaris Schooner with daughter and went over results.  Daughter states pt is not doing any better.  Doesn't really feel tired, just more of a feeling of weakness.  Advised I would send to Dr. Tamala Julian to make him aware and see when he would like to see the pt in the office.  Pt currently scheduled to return in November.

## 2018-11-13 MED ORDER — ISOSORB DINITRATE-HYDRALAZINE 20-37.5 MG PO TABS: 1.0000 | ORAL_TABLET | Freq: Two times a day (BID) | ORAL | 3 refills | Status: DC

## 2018-11-13 NOTE — Telephone Encounter (Signed)
Spoke with daughter and went over recommendations.  Scheduled pt to see Daune Perch, NP on a day that Dr. Tamala Julian is in the office.  Daughter verbalized understanding and was in agreement with this plan.

## 2018-11-13 NOTE — Telephone Encounter (Signed)
Please let the daughter know that the findings on the echo are very concerning.  Poor kidney function will prevent using some of the therapies that we ordinarily use.  Stop lisinopril.  Start BiDil 1 tablet twice daily.  Basic metabolic panel 1 week later.  He will need follow-up in office with a team member in 7 to 10 days.

## 2018-11-16 ENCOUNTER — Telehealth: Payer: Self-pay | Admitting: Cardiology

## 2018-11-16 NOTE — Telephone Encounter (Signed)
Pt daughter called- her father has not tolerated BiDil well- weakness.  I suggested she stop this and keep f/u 9/9 as scheduled.  Kerin Ransom PA-C 11/16/2018 1:05 PM

## 2018-11-20 ENCOUNTER — Other Ambulatory Visit: Payer: Self-pay

## 2018-11-20 ENCOUNTER — Ambulatory Visit (INDEPENDENT_AMBULATORY_CARE_PROVIDER_SITE_OTHER): Payer: Medicare Other | Admitting: Cardiology

## 2018-11-20 ENCOUNTER — Encounter: Payer: Self-pay | Admitting: Cardiology

## 2018-11-20 VITALS — BP 120/52 | HR 72 | Ht 66.0 in | Wt 170.0 lb

## 2018-11-20 DIAGNOSIS — I10 Essential (primary) hypertension: Secondary | ICD-10-CM | POA: Diagnosis not present

## 2018-11-20 DIAGNOSIS — N184 Chronic kidney disease, stage 4 (severe): Secondary | ICD-10-CM | POA: Diagnosis not present

## 2018-11-20 DIAGNOSIS — I5043 Acute on chronic combined systolic (congestive) and diastolic (congestive) heart failure: Secondary | ICD-10-CM | POA: Diagnosis not present

## 2018-11-20 DIAGNOSIS — I2510 Atherosclerotic heart disease of native coronary artery without angina pectoris: Secondary | ICD-10-CM

## 2018-11-20 MED ORDER — TORSEMIDE 20 MG PO TABS: 20.0000 mg | ORAL_TABLET | Freq: Two times a day (BID) | ORAL | 3 refills | Status: DC

## 2018-11-20 NOTE — Addendum Note (Signed)
Addended by: Belva Crome on: 11/20/2018 06:24 PM   Modules accepted: Level of Service

## 2018-11-20 NOTE — Patient Instructions (Addendum)
Medication Instructions:  STOP: Lasix   START: Torsemide 20 mg twice a day   If you need a refill on your cardiac medications before your next appointment, please call your pharmacy.   Lab work: TODAY: BMET & CBC  If you have labs (blood work) drawn today and your tests are completely normal, you will receive your results only by: Marland Kitchen MyChart Message (if you have MyChart) OR . A paper copy in the mail If you have any lab test that is abnormal or we need to change your treatment, we will call you to review the results.  Testing/Procedures:   Follow-Up: Follow up with Pecolia Ades, NP on 12/05/2018 @ 2:30 PM  [place] ON 02/08/2019 @ 2:20 PM   Any Other Special Instructions Will Be Listed Below (If Applicable).

## 2018-11-20 NOTE — Progress Notes (Addendum)
The patient has been seen in conjunction with Daune Perch, NP-C. All aspects of care have been considered and discussed. The patient has been personally interviewed, examined, and all clinical data has been reviewed.   End-of-life discussion was had with the patient and his daughter present.  Had greater than 85 years of age, ejection fraction less than 20%, and refractory volume overload due to hypotension, treatment options are few.  Prognosis for long-term survival beyond 6 to 12 months is guarded.  This was discussed with the patient and his daughter.  I asked that they consider DO NOT RESUSCITATE orders.  Treatment options explored include "full court press" with hospitalizations including IV diuresis to improve current condition.  Because of age he would not be a candidate for advanced heart failure therapies.  This type aggressive approach could lead to invasive procedures and possibly mechanical ventilation should complications arise.  I did explain limitations on procedures is possible in this scenario.  The other option would be palliative care/Hospice with DO NOT RESUSCITATE orders with a goal towards comfort care and remaining at home rather than having multiple hospital stays.  They were not willing to commit to either treatment path at this time.  He is concerned about admission to the hospital in the [place] 19 pandemic era.  Today we switched from furosemide to torsemide which will hopefully decrease volume.  Plan is to have further conversation over the next 3 to 7 days.  45 minutes was spent.  Greater than 50% of the time during this office visit was spent in education, counseling, and coordination of care related to underlying disease process and testing as outlined.   Cardiology Office Note:    Date:  11/20/2018   ID:  [name], DOB 23-Nov-1926, MRN [id number]  PCP:  Lavone Orn, MD  Cardiologist:  Sinclair Grooms, MD  Referring MD: Lavone Orn, MD   Chief  Complaint  Patient presents with  . Congestive Heart Failure    History of Present Illness:    Danny [name] is a 85 y.o. male with a past medical history significant for coronary artery disease s/p CABG 2002 (LIMA to LAD, SVG to ramus, SVG to obtuse marginal, and SVG to PDA), hypertension, hyperlipidemia, PAD with bilateral iliac stent 2016 by Dr. Fletcher Anon, carotid artery disease s/p bilateral CEA and CKD.   Was recently seen in the office on 10/30/2018 by Dr. Tamala Julian after finding of acute on chronic diastolic heart failure and initiation of Lasix 40 mg daily.  He reported improvement in symptoms.  Lower extremity swelling had decreased but not completely resolved.  Orthopnea had resolved.  He still had dyspnea on exertion.  Dr. Tamala Julian ordered labs and an echocardiogram.  Echocardiogram showed significant decrease in LV function with EF 15-20%, mild to moderately dilated LV and diastolic dysfunction.  RV systolic pressure was moderately elevated with estimated pressure of 43 mmHg.  EF is down from 60-65% by last echo in 12/2017.  Labs showed serum creatinine of 2.20.  The patient's baseline creatinine had been about 1.5-1.7 over the last couple of years.  Pro BNP was elevated at 12,982.  Dr. Tamala Julian switched metoprolol to carvedilol.  Due to poor renal function ACE inhibitor was stopped and patient was started on BiDil.  Patient's daughter called in to report that her father was not tolerating BiDil well due to weakness.  He was advised to stop the BiDil and follow-up in the office.  Today Danny [name] is here with  his daughter.  Mr. Willden lives alone and has been continuing to be fairly active.  He mowed his grass the other day.  His daughter feels that he is still significantly volume overloaded.  She notes that the patient had only taken 3 doses of the Bidil when he developed Blurred vision, weakness, off balance and confusion. BP got down to 78.  He stopped taking that medication.  Over the last 3 days  BP has come up to 120's/50's-60's.   Today Mr. Sangalang says that his breathing has gotten a little worse with shortness of breath with less activity. His dry wt is about 165 pounds. His wt has been around 168-170 over the last week. His LE edema is a little better by report, still has trace-1+ lower leg edema. Pt says that his stomach feels fuller.  He does have orthopnea, unable to lay flat at night.  The patient and his daughter feel that the patient is still significantly volume overloaded.   Past Medical History:  Diagnosis Date  . Basal cell carcinoma of nose    "right side; burned it off"  . Carotid artery disease ([place])    a. s/p prior carotid endarterectomy.  . Cerebrovascular disease   . Chronic kidney disease (CKD), stage III (moderate) (HCC)   . Coronary artery disease   . ED (erectile dysfunction)   . History of gout   . Hyperlipidemia   . Hypertension   . Insomnia   . Myocardial infarction ([place]) 2002  . PVD (peripheral vascular disease) ([place])    a. s/p right and left common iliac artery angioplasty with bilateral stent placement.  . Syncope   . Unequal blood pressure in upper extremities     Past Surgical History:  Procedure Laterality Date  . ABDOMINAL HERNIA REPAIR  X 4  . APPENDECTOMY    . CAROTID ENDARTERECTOMY Bilateral   . CORONARY ANGIOPLASTY WITH STENT PLACEMENT     "~ 6 months before bypass"  . CORONARY ARTERY BYPASS GRAFT  2002   "CABG X4"  . HERNIA REPAIR    . PERIPHERAL VASCULAR CATHETERIZATION N/A 08/05/2014   Procedure: Abdominal Aortogram;  Surgeon: Wellington Hampshire, MD;  Location: [facility];  Service: Cardiovascular;  Laterality: N/A;  . PERIPHERAL VASCULAR CATHETERIZATION Bilateral 08/05/2014   Procedure: Peripheral Vascular Intervention;  Surgeon: Wellington Hampshire, MD;  Location: [facility];  Service: Cardiovascular;  Laterality: Bilateral;  iliacs    Current Medications: Current Meds  Medication Sig  . allopurinol (ZYLOPRIM) 100  MG tablet Take 1 tablet by mouth at bedtime.   Marland Kitchen aspirin EC 81 MG tablet Take 81 mg by mouth daily.  Marland Kitchen atorvastatin (LIPITOR) 40 MG tablet Take 40 mg by mouth daily at 6 PM.   . carvedilol (COREG) 3.125 MG tablet Take 1 tablet (3.125 mg total) by mouth 2 (two) times daily.  . cyanocobalamin 100 MCG tablet Take 100 mcg by mouth daily.  . nitroGLYCERIN (NITROSTAT) 0.4 MG SL tablet Place 0.4 mg under the tongue every 5 (five) minutes as needed for chest pain.   . [DISCONTINUED] furosemide (LASIX) 40 MG tablet Take 1 tablet (40 mg total) by mouth daily.     Allergies:   Bidil [isosorb dinitrate-hydralazine] and Clindamycin/lincomycin   Social History   Socioeconomic History  . Marital status: Widowed    Spouse name: Not on file  . Number of children: Not on file  . Years of education: Not on file  . Highest education  level: Not on file  Occupational History  . Not on file  Social Needs  . Financial resource strain: Not on file  . Food insecurity    Worry: Not on file    Inability: Not on file  . Transportation needs    Medical: Not on file    Non-medical: Not on file  Tobacco Use  . Smoking status: Former Smoker    Packs/day: 1.00    Years: 56.00    Pack years: 56.00    Types: Cigarettes  . Smokeless tobacco: Never Used  . Tobacco comment: "stopped smoking when I had MI in 2002"  Substance and Sexual Activity  . Alcohol use: No  . Drug use: No  . Sexual activity: Not on file  Lifestyle  . Physical activity    Days per week: Not on file    Minutes per session: Not on file  . Stress: Not on file  Relationships  . Social Herbalist on phone: Not on file    Gets together: Not on file    Attends religious service: Not on file    Active member of club or organization: Not on file    Attends meetings of clubs or organizations: Not on file    Relationship status: Not on file  Other Topics Concern  . Not on file  Social History Narrative  . Not on file      Family History: The patient's family history includes Heart failure in his father and mother. There is no history of Heart attack or Hypertension. ROS:   Please see the history of present illness.     All other systems reviewed and are negative.  EKGs/Labs/Other Studies Reviewed:    The following studies were reviewed today:  Echocardiogram 11/06/2018 IMPRESSIONS  1. Moderate dyskinesis of the left ventricular, mid-apical anteroseptal wall.  2. The left ventricle has a visually estimated ejection fraction of 15-20%. The cavity size was mild to moderately dilated. Left ventricular diastolic Doppler parameters are consistent with pseudonormalization.  3. The right ventricle has moderately reduced systolic function. The cavity was normal. There is no increase in right ventricular wall thickness. Right ventricular systolic pressure is moderately elevated with an estimated pressure of 43.0 mmHg.  4. Left atrial size was mildly dilated.  5. Right atrial size was mildly dilated.  6. No evidence of mitral valve stenosis.  7. The aortic valve is abnormal. Moderate thickening of the aortic valve. Moderate calcification of the aortic valve. No stenosis of the aortic valve.  8. The aorta is normal unless otherwise noted.  9. The aortic root and ascending aorta are normal in size and structure. 10. The atrial septum is grossly normal.  Echo 12/20/2017: EF 60-65%  EKG:  EKG is not ordered today.    Recent Labs: 12/19/2017: B Natriuretic Peptide 322.2 09/25/2018: Hemoglobin 10.5; Platelets 156; TSH 4.330 10/30/2018: ALT 19; BUN 38; Creatinine, Ser 2.20; NT-Pro BNP 12,982; Potassium 4.6; Sodium 143   Recent Lipid Panel    Component Value Date/Time   CHOL 142 05/23/2018 1438   TRIG 53 05/23/2018 1438   HDL 61 05/23/2018 1438   CHOLHDL 2.3 05/23/2018 1438   LDLCALC 70 05/23/2018 1438    Physical Exam:    VS:  BP (!) 120/52   Pulse 72   Ht '5\' 6"'$  (1.676 m)   Wt 170 lb (77.1 kg)   SpO2 98%    BMI 27.44 kg/m     Wt Readings from Last 3  Encounters:  11/20/18 170 lb (77.1 kg)  10/30/18 167 lb (75.8 kg)  09/17/18 168 lb 8 oz (76.4 kg)     Physical Exam  Constitutional: He is oriented to person, place, and time. He appears well-developed and well-nourished. No distress.  HENT:  Head: Normocephalic and atraumatic.  Neck: Normal range of motion. Neck supple. No JVD present.  Cardiovascular: Normal rate, regular rhythm, normal heart sounds and intact distal pulses. Exam reveals no gallop and no friction rub.  No murmur heard. Pulmonary/Chest: Effort normal and breath sounds normal. No respiratory distress. He has no wheezes.  Few crackles in posterior bases  Abdominal: Soft. Bowel sounds are normal. He exhibits distension.  Musculoskeletal:        General: Edema present.     Comments: Trace-1+ lower leg edema bilaterally  Neurological: He is alert and oriented to person, place, and time.  Skin: Skin is warm and dry.  Psychiatric: He has a normal mood and affect. His behavior is normal. Judgment and thought content normal.     ASSESSMENT:    1. Acute on chronic combined systolic and diastolic heart failure ([place])   2. Coronary artery disease involving native coronary artery of native heart without angina pectoris   3. CKD (chronic kidney disease) stage 4, GFR 15-29 ml/min (HCC)   4. Essential hypertension, benign    PLAN:    In order of problems listed above:  Acute combined systolic and diastolic heart failure -Patient was recently seen for heart failure type complaints.  He was started on Lasix 40 mg daily with improvement. -Echocardiogram 11/06/2018 showed significant decrease in LV function with EF 15-20%, mild to moderately dilated LV and diastolic dysfunction.  RV systolic pressure was moderately elevated with estimated pressure of 43 mmHg.  EF is down from 60-65% by last echo in 12/2017.  Pro BNP was elevated at 12,982.  Dr. Tamala Julian switched metoprolol to carvedilol.   Due to poor renal function ACE inhibitor was stopped and patient was started on BiDil, however the patient could not tolerate this due to symptomatic hypotension. -Today the patient notes slight worsening of his symptoms, on Lasix 40 mg daily. -The patient appears to have low output heart failure as well as worsening renal function and at this point we have limitations on treatment due to hypotension.  -The patient is at risk for sudden cardiac death with his significant LV dysfunction.  Dr. Tamala Julian came in and had a long discussion with the patient and his daughter about his poor prognosis and options for aggressive therapies versus the more realistic conservative treatment with diuretics to give him the best quality of life.  -With his abdominal fullness, we will switch him to torsemide 20 mg twice daily to try to optimize diuresis.  -I will see him in the office in 2 weeks to assess his response to therapy.  CKD stage  IV -The patient's baseline creatinine had been about 1.5-1.7 over the last couple of years.  Serum creatinine on recent labs was 2.2 -Check be met today.  Patient may need potassium with escalation of diuretic therapy. -We will place priority of diuresis and comfort of the patient above worsening renal function at this point.  CAD -s/p CABG 2002 (LIMA to LAD, SVG to ramus, SVG to obtuse marginal, and SVG to PDA) -Patient is not having any chest pain.  Is decreasing LV function is likely due to some type of cardiac event or loss of a graft.  Essential hypertension -Patient now has  relative hypotension.  Unable to tolerate most heart failure medications.   Medication Adjustments/Labs and Tests Ordered: Current medicines are reviewed at length with the patient today.  Concerns regarding medicines are outlined above. Labs and tests ordered and medication changes are outlined in the patient instructions below:  Patient Instructions  Medication Instructions:  STOP: Lasix   START:  Torsemide 20 mg twice a day   If you need a refill on your cardiac medications before your next appointment, please call your pharmacy.   Lab work: TODAY: BMET & CBC  If you have labs (blood work) drawn today and your tests are completely normal, you will receive your results only by: Marland Kitchen MyChart Message (if you have MyChart) OR . A paper copy in the mail If you have any lab test that is abnormal or we need to change your treatment, we will call you to review the results.  Testing/Procedures:   Follow-Up: Follow up with Pecolia Ades, NP on 12/05/2018 @ 2:30 PM  [place] ON 02/02/2019 @ 2:20 PM   Any Other Special Instructions Will Be Listed Below (If Applicable).       Signed, Daune Perch, NP  11/20/2018 2:13 PM    [place]

## 2018-11-21 ENCOUNTER — Telehealth: Payer: Self-pay

## 2018-11-21 DIAGNOSIS — I10 Essential (primary) hypertension: Secondary | ICD-10-CM

## 2018-11-21 LAB — CBC
Hematocrit: 31.4 % — ABNORMAL LOW (ref 37.5–51.0)
Hemoglobin: 10.8 g/dL — ABNORMAL LOW (ref 13.0–17.7)
MCH: 35.5 pg — ABNORMAL HIGH (ref 26.6–33.0)
MCHC: 34.4 g/dL (ref 31.5–35.7)
MCV: 103 fL — ABNORMAL HIGH (ref 79–97)
Platelets: 137 10*3/uL — ABNORMAL LOW (ref 150–450)
RBC: 3.04 x10E6/uL — ABNORMAL LOW (ref 4.14–5.80)
RDW: 14 % (ref 11.6–15.4)
WBC: 7.8 10*3/uL (ref 3.4–10.8)

## 2018-11-21 LAB — BASIC METABOLIC PANEL
BUN/Creatinine Ratio: 22 (ref 10–24)
BUN: 46 mg/dL — ABNORMAL HIGH (ref 10–36)
CO2: 21 mmol/L (ref 20–29)
Calcium: 9.5 mg/dL (ref 8.6–10.2)
Chloride: 97 mmol/L (ref 96–106)
Creatinine, Ser: 2.13 mg/dL — ABNORMAL HIGH (ref 0.76–1.27)
GFR calc Af Amer: 31 mL/min/{1.73_m2} — ABNORMAL LOW (ref >59)
GFR calc non Af Amer: 26 mL/min/{1.73_m2} — ABNORMAL LOW (ref >59)
Glucose: 98 mg/dL (ref 65–99)
Potassium: 5.2 mmol/L (ref 3.5–5.2)
Sodium: 136 mmol/L (ref 134–144)

## 2018-11-21 NOTE — Telephone Encounter (Signed)
Spoke with pt's daughter per dpr. Pt made aware of results. Pt will come in for labs on 11/27/2018

## 2018-11-21 NOTE — Telephone Encounter (Signed)
-----   Message from Daune Perch, NP sent at 11/21/2018  1:29 PM EDT ----- Kidney function is stable. Potassium is high normal. No need to add potassium supplement at this time. Recheck BMet in 7 days after starting torsemide. Pt is mildly anemic, has been stable around the same for several years.   Daune Perch, NP

## 2018-11-27 ENCOUNTER — Other Ambulatory Visit: Payer: Self-pay

## 2018-11-27 ENCOUNTER — Other Ambulatory Visit: Payer: Medicare Other | Admitting: *Deleted

## 2018-11-27 DIAGNOSIS — I10 Essential (primary) hypertension: Secondary | ICD-10-CM

## 2018-11-28 ENCOUNTER — Telehealth: Payer: Self-pay | Admitting: Interventional Cardiology

## 2018-11-28 LAB — BASIC METABOLIC PANEL
BUN/Creatinine Ratio: 22 (ref 10–24)
BUN: 48 mg/dL — ABNORMAL HIGH (ref 10–36)
CO2: 25 mmol/L (ref 20–29)
Calcium: 9.5 mg/dL (ref 8.6–10.2)
Chloride: 99 mmol/L (ref 96–106)
Creatinine, Ser: 2.18 mg/dL — ABNORMAL HIGH (ref 0.76–1.27)
GFR calc Af Amer: 30 mL/min/{1.73_m2} — ABNORMAL LOW (ref >59)
GFR calc non Af Amer: 26 mL/min/{1.73_m2} — ABNORMAL LOW (ref >59)
Glucose: 96 mg/dL (ref 65–99)
Potassium: 3.8 mmol/L (ref 3.5–5.2)
Sodium: 141 mmol/L (ref 134–144)

## 2018-11-28 NOTE — Telephone Encounter (Signed)
New message:    Patient daughter calling to ask a question about the lab.

## 2018-11-28 NOTE — Telephone Encounter (Signed)
Daughter on [place].  Left message on VM letting her know labs were stable.  Advised to call back if any questions.

## 2018-12-05 ENCOUNTER — Encounter: Payer: Self-pay | Admitting: Cardiology

## 2018-12-05 ENCOUNTER — Ambulatory Visit (INDEPENDENT_AMBULATORY_CARE_PROVIDER_SITE_OTHER): Payer: Medicare Other | Admitting: Cardiology

## 2018-12-05 ENCOUNTER — Other Ambulatory Visit: Payer: Self-pay

## 2018-12-05 VITALS — BP 112/52 | HR 72 | Ht 66.0 in | Wt 163.0 lb

## 2018-12-05 DIAGNOSIS — N184 Chronic kidney disease, stage 4 (severe): Secondary | ICD-10-CM

## 2018-12-05 DIAGNOSIS — I10 Essential (primary) hypertension: Secondary | ICD-10-CM | POA: Diagnosis not present

## 2018-12-05 DIAGNOSIS — I5043 Acute on chronic combined systolic (congestive) and diastolic (congestive) heart failure: Secondary | ICD-10-CM | POA: Diagnosis not present

## 2018-12-05 DIAGNOSIS — I2510 Atherosclerotic heart disease of native coronary artery without angina pectoris: Secondary | ICD-10-CM | POA: Diagnosis not present

## 2018-12-05 DIAGNOSIS — Z79899 Other long term (current) drug therapy: Secondary | ICD-10-CM

## 2018-12-05 NOTE — Patient Instructions (Addendum)
Medication Instructions:  Your physician recommends that you continue on your current medications as directed. Please refer to the Current Medication list given to you today.  If you need a refill on your cardiac medications before your next appointment, please call your pharmacy.   Lab work: FUTURE; BMET on 12/19/2018  If you have labs (blood work) drawn today and your tests are completely normal, you will receive your results only by: Marland Kitchen MyChart Message (if you have MyChart) OR . A paper copy in the mail If you have any lab test that is abnormal or we need to change your treatment, we will call you to review the results.  Testing/Procedures: None   Follow-Up: You are scheduled to see Dr. Tamala Julian on 02/03/2019 @ 2:20 PM  Any Other Special Instructions Will Be Listed Below (If Applicable).

## 2018-12-05 NOTE — Progress Notes (Signed)
Cardiology Office Note:    Date:  12/05/2018   ID:  Danny [name], DOB 1926-03-20, MRN [id number]  PCP:  Lavone Orn, MD  Cardiologist:  Sinclair Grooms, MD  Referring MD: Lavone Orn, MD   Chief Complaint  Patient presents with  . Follow-up  . Congestive Heart Failure    History of Present Illness:    Danny [name] is a 85 y.o. male with a past medical history significant for coronary artery disease s/p CABG 2002 (LIMA to LAD, SVG to ramus, SVG to obtuse marginal, and SVG to PDA), hypertension, hyperlipidemia, PAD with bilateral iliac stent 2016 by Dr. Fletcher Anon, carotid artery disease s/p bilateral CEA and CKD.   Patient has been treated for diastolic dysfunction with Lasix, however echocardiogram on 11/06/2018 showed EF of 15-20% which was down from 60-65% by echo in 12/2017. RV systolic pressure was moderately elevated with estimated pressure of 43 mmHg. Labs showed serum creatinine of 2.20.  The patient's baseline creatinine had been about 1.5-1.7 over the last couple of years.  Pro BNP was elevated at 12,982.  Dr. Tamala Julian switched metoprolol to carvedilol.  Due to poor renal function ACE inhibitor was stopped. Pt was unable to tolerate a trial of Bidil due to hypotension/weakness.  At last office visit dr. Tamala Julian discussed the seriousness of the patient's heart failure with evidence of low flow and organ dysfunction. (kidneys). Also putting the patient at risk for sudden cardiac death. With the patient's advanced age, pt not felt to be a candidate for advanced heart failure therapies. He recommended pt to consider DNR status and possibly palliative care/Hospice. Since the patient continued to be fairly active, living alone and mowing his grass, the daughter was reluctant for any end of life planning.   Mr. [name] is here today for close follow up after switching his lasix to torsemide for better bioavailability. Mr [name] says that he is feeling much better.  His weight is down 8-9  pounds.  His ankle edema nearly resolved. Abdominal fullness better. Breathing is better.  He still has early satiety but is trying to get enough to eat.  He is still living alone with his daughter checking on him frequently.  He cooks and still mows the grass and does weed eating.  He knows to take frequent breaks and pace himself.  He denies any lightheadedness.   Home weights down 8-9 pounds.   170>>161.5 Home blood pressures- 110's/50's-60's. Occ SBP in the 120's.   Cardiac studies   Echocardiogram 11/06/2018 IMPRESSIONS 1. Moderate dyskinesis of the left ventricular, mid-apical anteroseptal wall. 2. The left ventricle has a visually estimated ejection fraction of 15-20%. The cavity size was mild to moderately dilated. Left ventricular diastolic Doppler parameters are consistent with pseudonormalization. 3. The right ventricle has moderately reduced systolic function. The cavity was normal. There is no increase in right ventricular wall thickness. Right ventricular systolic pressure is moderately elevated with an estimated pressure of 43.0 mmHg. 4. Left atrial size was mildly dilated. 5. Right atrial size was mildly dilated. 6. No evidence of mitral valve stenosis. 7. The aortic valve is abnormal. Moderate thickening of the aortic valve. Moderate calcification of the aortic valve. No stenosis of the aortic valve. 8. The aorta is normal unless otherwise noted. 9. The aortic root and ascending aorta are normal in size and structure. 10. The atrial septum is grossly normal.  Echo 12/20/2017: EF 60-65%   Past Medical History:  Diagnosis Date  . Basal cell carcinoma  of nose    "right side; burned it off"  . Carotid artery disease ([place])    a. s/p prior carotid endarterectomy.  . Cerebrovascular disease   . Chronic kidney disease (CKD), stage III (moderate) (HCC)   . Coronary artery disease   . ED (erectile dysfunction)   . History of gout   . Hyperlipidemia   . Hypertension    . Insomnia   . Myocardial infarction ([place]) 2002  . PVD (peripheral vascular disease) ([place])    a. s/p right and left common iliac artery angioplasty with bilateral stent placement.  . Syncope   . Unequal blood pressure in upper extremities     Past Surgical History:  Procedure Laterality Date  . ABDOMINAL HERNIA REPAIR  X 4  . APPENDECTOMY    . CAROTID ENDARTERECTOMY Bilateral   . CORONARY ANGIOPLASTY WITH STENT PLACEMENT     "~ 6 months before bypass"  . CORONARY ARTERY BYPASS GRAFT  2002   "CABG X4"  . HERNIA REPAIR    . PERIPHERAL VASCULAR CATHETERIZATION N/A 08/05/2014   Procedure: Abdominal Aortogram;  Surgeon: Wellington Hampshire, MD;  Location: [facility];  Service: Cardiovascular;  Laterality: N/A;  . PERIPHERAL VASCULAR CATHETERIZATION Bilateral 08/05/2014   Procedure: Peripheral Vascular Intervention;  Surgeon: Wellington Hampshire, MD;  Location: [facility];  Service: Cardiovascular;  Laterality: Bilateral;  iliacs    Current Medications: Current Meds  Medication Sig  . allopurinol (ZYLOPRIM) 100 MG tablet Take 1 tablet by mouth at bedtime.   Marland Kitchen aspirin EC 81 MG tablet Take 81 mg by mouth daily.  Marland Kitchen atorvastatin (LIPITOR) 40 MG tablet Take 40 mg by mouth daily at 6 PM.   . carvedilol (COREG) 3.125 MG tablet Take 1 tablet (3.125 mg total) by mouth 2 (two) times daily.  . cyanocobalamin 100 MCG tablet Take 100 mcg by mouth daily.  . nitroGLYCERIN (NITROSTAT) 0.4 MG SL tablet Place 0.4 mg under the tongue every 5 (five) minutes as needed for chest pain.   Marland Kitchen torsemide (DEMADEX) 20 MG tablet Take 1 tablet (20 mg total) by mouth 2 (two) times daily.     Allergies:   Bidil [isosorb dinitrate-hydralazine] and Clindamycin/lincomycin   Social History   Socioeconomic History  . Marital status: Widowed    Spouse name: Not on file  . Number of children: Not on file  . Years of education: Not on file  . Highest education level: Not on file  Occupational History  .  Not on file  Social Needs  . Financial resource strain: Not on file  . Food insecurity    Worry: Not on file    Inability: Not on file  . Transportation needs    Medical: Not on file    Non-medical: Not on file  Tobacco Use  . Smoking status: Former Smoker    Packs/day: 1.00    Years: 56.00    Pack years: 56.00    Types: Cigarettes  . Smokeless tobacco: Never Used  . Tobacco comment: "stopped smoking when I had MI in 2002"  Substance and Sexual Activity  . Alcohol use: No  . Drug use: No  . Sexual activity: Not on file  Lifestyle  . Physical activity    Days per week: Not on file    Minutes per session: Not on file  . Stress: Not on file  Relationships  . Social Herbalist on phone: Not on file    Gets  together: Not on file    Attends religious service: Not on file    Active member of club or organization: Not on file    Attends meetings of clubs or organizations: Not on file    Relationship status: Not on file  Other Topics Concern  . Not on file  Social History Narrative  . Not on file     Family History: The patient's family history includes Heart failure in his father and mother. There is no history of Heart attack or Hypertension. ROS:   Please see the history of present illness.     All other systems reviewed and are negative.   EKG:  EKG is not ordered today.    Recent Labs: 12/19/2017: B Natriuretic Peptide 322.2 09/25/2018: TSH 4.330 10/30/2018: ALT 19; NT-Pro BNP 12,982 11/20/2018: Hemoglobin 10.8; Platelets 137 11/27/2018: BUN 48; Creatinine, Ser 2.18; Potassium 3.8; Sodium 141   Recent Lipid Panel    Component Value Date/Time   CHOL 142 05/23/2018 1438   TRIG 53 05/23/2018 1438   HDL 61 05/23/2018 1438   CHOLHDL 2.3 05/23/2018 1438   LDLCALC 70 05/23/2018 1438    Physical Exam:    VS:  BP (!) 112/52   Pulse 72   Ht 5\' 6"  (1.676 m)   Wt 163 lb (73.9 kg)   SpO2 99%   BMI 26.31 kg/m     Wt Readings from Last 6 Encounters:   12/05/18 163 lb (73.9 kg)  11/20/18 170 lb (77.1 kg)  10/30/18 167 lb (75.8 kg)  09/17/18 168 lb 8 oz (76.4 kg)  07/23/18 170 lb (77.1 kg)  05/23/18 175 lb 9.6 oz (79.7 kg)     Physical Exam  Constitutional: He is oriented to person, place, and time. No distress.  Slightly frail, elderly male  HENT:  Head: Normocephalic and atraumatic.  Neck: Normal range of motion. Neck supple. No JVD present.  Cardiovascular: Normal rate, regular rhythm, normal heart sounds and intact distal pulses. Exam reveals no gallop and no friction rub.  No murmur heard. Pulmonary/Chest: Effort normal and breath sounds normal. No respiratory distress. He has no wheezes. He has no rales.  Abdominal: Soft. Bowel sounds are normal.  Musculoskeletal: Normal range of motion.        General: No edema.  Neurological: He is alert and oriented to person, place, and time.  Skin: Skin is warm and dry.  Psychiatric: He has a normal mood and affect. His behavior is normal. Judgment and thought content normal.  Vitals reviewed.    ASSESSMENT:    1. Acute on chronic combined systolic and diastolic heart failure ([place])   2. CKD (chronic kidney disease) stage 4, GFR 15-29 ml/min (HCC)   3. Coronary artery disease involving native coronary artery of native heart without angina pectoris   4. Essential (primary) hypertension   5. Medication management    PLAN:    In order of problems listed above:  Acute systolic and diastolic heart failure -Patient with recently reduced LV function, EF 15- 20%.  Patient also has renal insufficiency and has been unable to tolerate heart failure treatment due to low blood pressure, weakness.  This indicates a likely low flow heart failure and poor prognosis. -At this time, priority is symptom management/comfort, mostly with diuresis. -Patient was last seen on 11/20/2018 with mildly increased shortness of breath and edema.  We switched his Lasix to torsemide for better bioavailability.  The  patient reports that he is considerably better with improved breathing,  resolution of ankle edema, no orthopnea. -I again discussed the patient's poor prognosis, but with his improvement on diuretic and ability to continue his activities of daily living, the patient and his daughter feel that they are not ready for hospice or palliative care at this time.  We discussed watching his fluid status closely for not only fluid overload but possible overdiuresis at some point.  They seem to have a good grasp on therapy.  We also discussed that they will call the office if his condition deteriorates and we can reconsider palliative care/hospice in the future. -Patient has follow-up with Dr. Tamala Julian in early November.  CKD stage IV -Baseline creatinine about 1.5-1.7 over the last couple of years. -Follow-up labs after starting torsemide indicate renal function is stable with creatinine of 2.18, was 2.13 prior to med change.  Potassium is down from 5.2 to 3.8. -We will recheck a metabolic panel in 2 weeks to continue to monitor renal function and potassium.  His daughter is very interested in continuing close monitoring with his labs.  CAD -s/p CABG 2002 (LIMA to LAD, SVG to ramus, SVG to obtuse marginal, and SVG to PDA) -Patient is not having any chest pain.  Is decreasing LV function is likely due to some type of cardiac event or loss of a graft.  Essential hypertension -Patient now has relative hypotension.  Unable to tolerate most heart failure medications.  Home blood pressures have been stable and blood pressure is good today.  He is having no lightheadedness.  Continue current therapy.   Medication Adjustments/Labs and Tests Ordered: Current medicines are reviewed at length with the patient today.  Concerns regarding medicines are outlined above. Labs and tests ordered and medication changes are outlined in the patient instructions below:  Patient Instructions  Medication Instructions:  Your  physician recommends that you continue on your current medications as directed. Please refer to the Current Medication list given to you today.  If you need a refill on your cardiac medications before your next appointment, please call your pharmacy.   Lab work: FUTURE; BMET on 12/19/2018  If you have labs (blood work) drawn today and your tests are completely normal, you will receive your results only by: Marland Kitchen MyChart Message (if you have MyChart) OR . A paper copy in the mail If you have any lab test that is abnormal or we need to change your treatment, we will call you to review the results.  Testing/Procedures: None   Follow-Up: You are scheduled to see Dr. Tamala Julian on 02/07/2019 @ 2:20 PM  Any Other Special Instructions Will Be Listed Below (If Applicable).      Signed, Daune Perch, NP  12/05/2018 4:34 PM    [place]

## 2018-12-19 ENCOUNTER — Other Ambulatory Visit: Payer: Medicare Other | Admitting: *Deleted

## 2018-12-19 ENCOUNTER — Other Ambulatory Visit: Payer: Self-pay

## 2018-12-19 DIAGNOSIS — I5043 Acute on chronic combined systolic (congestive) and diastolic (congestive) heart failure: Secondary | ICD-10-CM

## 2018-12-19 DIAGNOSIS — Z79899 Other long term (current) drug therapy: Secondary | ICD-10-CM

## 2018-12-20 LAB — BASIC METABOLIC PANEL
BUN/Creatinine Ratio: 21 (ref 10–24)
BUN: 45 mg/dL — ABNORMAL HIGH (ref 10–36)
CO2: 25 mmol/L (ref 20–29)
Calcium: 9.4 mg/dL (ref 8.6–10.2)
Chloride: 98 mmol/L (ref 96–106)
Creatinine, Ser: 2.15 mg/dL — ABNORMAL HIGH (ref 0.76–1.27)
GFR calc Af Amer: 30 mL/min/{1.73_m2} — ABNORMAL LOW (ref >59)
GFR calc non Af Amer: 26 mL/min/{1.73_m2} — ABNORMAL LOW (ref >59)
Glucose: 161 mg/dL — ABNORMAL HIGH (ref 65–99)
Potassium: 4 mmol/L (ref 3.5–5.2)
Sodium: 140 mmol/L (ref 134–144)

## 2019-01-13 ENCOUNTER — Telehealth: Payer: Self-pay | Admitting: Cardiovascular Disease

## 2019-01-13 NOTE — Telephone Encounter (Signed)
Follow up  Daughter calling back wants to know if Torsemide needs to be increased. Also reporting cough and some shortness of breath

## 2019-01-13 NOTE — Telephone Encounter (Signed)
Pt c/o swelling: STAT is pt has developed SOB within 24 hours  1) How much weight have you gained and in what time span? no  2) If swelling, where is the swelling located? ankles  3) Are you currently taking a fluid pill? Yes- wonder if he need to increase his fluid medicine? 4) Are you currently SOB?--- SOB because of CHF  5) Do you have a log of your daily weights (if so, list)?   6) Have you gained 3 pounds in a day or 5 pounds in a week?  Do not think so* 7) Have you traveled recently? no

## 2019-01-13 NOTE — Telephone Encounter (Signed)
Per Dr. Tamala Julian- increase Torsemide to 40mg  in AM and 40mg  in PM until swelling and cough resolve, then decrease to 40mg  in AM, 20mg  in PM.  Plan to do labs at f/u visit Monday.  Spoke with daughter and went over recommendations.  Daughter verbalized understanding.

## 2019-01-13 NOTE — Telephone Encounter (Signed)
The patient's daughter [name]) is calling because the patient recently has developed swollen bilateral ankles.  His SOB, due to CHF, has not gotten any worse than usual.  He is taking Torsemide 20 mg bid and has CKD.    The daughter was wondering if he could take an extra Torsemide, if needed until he comes to see you next week, 11/9.  Please advise, thank you.

## 2019-01-15 ENCOUNTER — Telehealth: Payer: Self-pay | Admitting: Interventional Cardiology

## 2019-01-15 NOTE — Telephone Encounter (Signed)
No issues with his current cardiac medications. For OTC medications, could try Pepto-Bismol (this would be less sedating than something like dramamine or meclizine). If this does not work, would have pt follow up with PCP - Zofran is a good option but is prescription-only.

## 2019-01-15 NOTE — Telephone Encounter (Signed)
New Message  Patient has been experiencing a lot of nausea and patient's daughter is calling in to see if there is a medicine that can be prescribed or a medicine that could be purchased over the counter to help with the nausea that the patient is experiencing. Please give patient's daughter a call back.

## 2019-01-15 NOTE — Telephone Encounter (Signed)
Spoke with daughter and went over recommendations.  Daughter verbalized understanding and was appreciative for call.

## 2019-01-15 NOTE — Telephone Encounter (Signed)
Any anti-nausea meds that interfere with cardiac meds?

## 2019-01-16 NOTE — Progress Notes (Signed)
Cardiology Office Note:    Date:  01/17/2019   ID:  Danny [name], DOB 03/27/1926, MRN [id number]  PCP:  Lavone Orn, MD  Cardiologist:  Sinclair Grooms, MD   Referring MD: Lavone Orn, MD   Chief Complaint  Patient presents with  . Congestive Heart Failure  . Coronary Artery Disease    History of Present Illness:    Danny [name] is a 85 y.o. male with a hx of coronary arterydisease and prior coronarybypass (LIMA to LAD, SVG to ramus, SVG to obtuse marginal, and SVG to PDA. 2002), hypertension, hyperlipidemia, PADwith bilateral iliacstent procedure2016 by Dr. Fletcher Anon, carotid disease(bilat CEA), and chronic kidney disease, and  acute on chronic systolic heart failure.  The more is noticing decreased appetite, more lower extremity swelling, and dyspnea.  We received this information 3 days ago at which time torsemide was increased to 40 mg twice daily.  Since then he has lost 4 pounds.  Breathing is not much better but certainly is not worse.  Lower extremity edema is down somewhat.  Past Medical History:  Diagnosis Date  . Basal cell carcinoma of nose    "right side; burned it off"  . Carotid artery disease ([place])    a. s/p prior carotid endarterectomy.  . Cerebrovascular disease   . Chronic kidney disease (CKD), stage III (moderate)   . Coronary artery disease   . ED (erectile dysfunction)   . History of gout   . Hyperlipidemia   . Hypertension   . Insomnia   . Myocardial infarction ([place]) 2002  . PVD (peripheral vascular disease) ([place])    a. s/p right and left common iliac artery angioplasty with bilateral stent placement.  . Syncope   . Unequal blood pressure in upper extremities     Past Surgical History:  Procedure Laterality Date  . ABDOMINAL HERNIA REPAIR  X 4  . APPENDECTOMY    . CAROTID ENDARTERECTOMY Bilateral   . CORONARY ANGIOPLASTY WITH STENT PLACEMENT     "~ 6 months before bypass"  . CORONARY ARTERY BYPASS GRAFT  2002   "CABG X4"  . HERNIA  REPAIR    . PERIPHERAL VASCULAR CATHETERIZATION N/A 08/05/2014   Procedure: Abdominal Aortogram;  Surgeon: Wellington Hampshire, MD;  Location: [facility];  Service: Cardiovascular;  Laterality: N/A;  . PERIPHERAL VASCULAR CATHETERIZATION Bilateral 08/05/2014   Procedure: Peripheral Vascular Intervention;  Surgeon: Wellington Hampshire, MD;  Location: [facility];  Service: Cardiovascular;  Laterality: Bilateral;  iliacs    Current Medications: Current Meds  Medication Sig  . allopurinol (ZYLOPRIM) 100 MG tablet Take 1 tablet by mouth at bedtime.   Marland Kitchen aspirin EC 81 MG tablet Take 81 mg by mouth daily.  Marland Kitchen atorvastatin (LIPITOR) 40 MG tablet Take 40 mg by mouth daily at 6 PM.   . carvedilol (COREG) 3.125 MG tablet Take 1 tablet (3.125 mg total) by mouth 2 (two) times daily.  . cyanocobalamin 100 MCG tablet Take 100 mcg by mouth daily.  . nitroGLYCERIN (NITROSTAT) 0.4 MG SL tablet Place 0.4 mg under the tongue every 5 (five) minutes as needed for chest pain.   Marland Kitchen ondansetron (ZOFRAN) 4 MG tablet Take 4 mg by mouth as needed.  . torsemide (DEMADEX) 20 MG tablet Take 40 mg by mouth 2 (two) times daily.     Allergies:   Bidil [isosorb dinitrate-hydralazine] and Clindamycin/lincomycin   Social History   Socioeconomic History  . Marital status: Widowed  Spouse name: Not on file  . Number of children: Not on file  . Years of education: Not on file  . Highest education level: Not on file  Occupational History  . Not on file  Social Needs  . Financial resource strain: Not on file  . Food insecurity    Worry: Not on file    Inability: Not on file  . Transportation needs    Medical: Not on file    Non-medical: Not on file  Tobacco Use  . Smoking status: Former Smoker    Packs/day: 1.00    Years: 56.00    Pack years: 56.00    Types: Cigarettes  . Smokeless tobacco: Never Used  . Tobacco comment: "stopped smoking when I had MI in 2002"  Substance and Sexual Activity  . Alcohol  use: No  . Drug use: No  . Sexual activity: Not on file  Lifestyle  . Physical activity    Days per week: Not on file    Minutes per session: Not on file  . Stress: Not on file  Relationships  . Social Herbalist on phone: Not on file    Gets together: Not on file    Attends religious service: Not on file    Active member of club or organization: Not on file    Attends meetings of clubs or organizations: Not on file    Relationship status: Not on file  Other Topics Concern  . Not on file  Social History Narrative  . Not on file     Family History: The patient's family history includes Heart failure in his father and mother. There is no history of Heart attack or Hypertension.  ROS:   Please see the history of present illness.    Cough, difficulty with lower extremity swelling, and decreased appetite as mentioned above.  All other systems reviewed and are negative.  EKGs/Labs/Other Studies Reviewed:    The following studies were reviewed today: 2-D Echocardiogram 2020: IMPRESSIONS    1. Moderate dyskinesis of the left ventricular, mid-apical anteroseptal wall.  2. The left ventricle has a visually estimated ejection fraction of 15-20%. The cavity size was mild to moderately dilated. Left ventricular diastolic Doppler parameters are consistent with pseudonormalization.  3. The right ventricle has moderately reduced systolic function. The cavity was normal. There is no increase in right ventricular wall thickness. Right ventricular systolic pressure is moderately elevated with an estimated pressure of 43.0 mmHg.  4. Left atrial size was mildly dilated.  5. Right atrial size was mildly dilated.  6. No evidence of mitral valve stenosis.  7. The aortic valve is abnormal. Moderate thickening of the aortic valve. Moderate calcification of the aortic valve. No stenosis of the aortic valve.  8. The aorta is normal unless otherwise noted.  9. The aortic root and ascending  aorta are normal in size and structure. 10. The atrial septum is grossly normal.  EKG:  EKG the most recent EKG done in July reveals sinus rhythm.  Clinically, heart rate and rhythm are stable today and therefore no tracing was felt indicated.  Recent Labs: 09/25/2018: TSH 4.330 10/30/2018: ALT 19; NT-Pro BNP 12,982 11/20/2018: Hemoglobin 10.8; Platelets 137 12/19/2018: BUN 45; Creatinine, Ser 2.15; Potassium 4.0; Sodium 140  Recent Lipid Panel    Component Value Date/Time   CHOL 142 05/23/2018 1438   TRIG 53 05/23/2018 1438   HDL 61 05/23/2018 1438   CHOLHDL 2.3 05/23/2018 1438   LDLCALC 70  05/23/2018 1438    Physical Exam:    VS:  BP (!) 108/52   Pulse 66   Ht 5\' 6"  (1.676 m)   Wt 160 lb 6.4 oz (72.8 kg)   SpO2 94%   BMI 25.89 kg/m     Wt Readings from Last 3 Encounters:  01/17/19 160 lb 6.4 oz (72.8 kg)  12/05/18 163 lb (73.9 kg)  11/20/18 170 lb (77.1 kg)     GEN: Elderly and frail. No acute distress HEENT: Normal NECK: No JVD. LYMPHATICS: No lymphadenopathy CARDIAC:  RRR without murmur, gallop, or edema. VASCULAR: Bilateral 2-3+ ankle edema.  Diminished left radial and no pedal pulses. No bruits. RESPIRATORY:  Clear to auscultation without rales, wheezing or rhonchi  ABDOMEN: Soft, non-tender, non-distended, No pulsatile mass, MUSCULOSKELETAL: No deformity  SKIN: Warm and dry NEUROLOGIC:  Alert and oriented x 3 PSYCHIATRIC:  Normal affect   ASSESSMENT:    1. Acute on chronic combined systolic and diastolic heart failure ([place])   2. CKD (chronic kidney disease) stage 4, GFR 15-29 ml/min (HCC)   3. Coronary artery disease involving native coronary artery of native heart without angina pectoris   4. Essential (primary) hypertension   5. PAD (peripheral artery disease) ([place])   6. Bilateral carotid artery stenosis   7. Educated about COVID-19 virus infection    PLAN:    In order of problems listed above:  1. Continue on torsemide 40 mg twice per day.  This is  an intensified dose that was started 72 hours ago.  He has lost 4 pounds.  We will get a basic metabolic panel and CBC today.  He will weigh and monitor lower extremity edema and call us early next week.  Clinical follow-up in 3 weeks. 2. Basic metabolic panel will be done today. 3. He denies angina. 4. Blood pressure target should be above 90 mmHg systolic.  His daughter is notified that if systolic blood pressures drift below 90 they should call immediately. 5. Not addressed 6. Not addressed 7. The 3 W's are being followed.  Clinical follow-up 3 weeks.   Medication Adjustments/Labs and Tests Ordered: Current medicines are reviewed at length with the patient today.  Concerns regarding medicines are outlined above.  No orders of the defined types were placed in this encounter.  No orders of the defined types were placed in this encounter.   There are no Patient Instructions on file for this visit.   Signed, Sinclair Grooms, MD  01/17/2019 11:19 AM    [place]

## 2019-01-17 ENCOUNTER — Encounter: Payer: Self-pay | Admitting: Interventional Cardiology

## 2019-01-17 ENCOUNTER — Other Ambulatory Visit: Payer: Self-pay | Admitting: *Deleted

## 2019-01-17 ENCOUNTER — Other Ambulatory Visit: Payer: Self-pay

## 2019-01-17 ENCOUNTER — Ambulatory Visit: Payer: Medicare Other | Admitting: Interventional Cardiology

## 2019-01-17 VITALS — BP 108/52 | HR 66 | Ht 66.0 in | Wt 160.4 lb

## 2019-01-17 DIAGNOSIS — I739 Peripheral vascular disease, unspecified: Secondary | ICD-10-CM

## 2019-01-17 DIAGNOSIS — I10 Essential (primary) hypertension: Secondary | ICD-10-CM

## 2019-01-17 DIAGNOSIS — I2510 Atherosclerotic heart disease of native coronary artery without angina pectoris: Secondary | ICD-10-CM | POA: Diagnosis not present

## 2019-01-17 DIAGNOSIS — I5043 Acute on chronic combined systolic (congestive) and diastolic (congestive) heart failure: Secondary | ICD-10-CM

## 2019-01-17 DIAGNOSIS — I6523 Occlusion and stenosis of bilateral carotid arteries: Secondary | ICD-10-CM

## 2019-01-17 DIAGNOSIS — N184 Chronic kidney disease, stage 4 (severe): Secondary | ICD-10-CM

## 2019-01-17 DIAGNOSIS — Z7189 Other specified counseling: Secondary | ICD-10-CM

## 2019-01-17 LAB — BASIC METABOLIC PANEL
BUN/Creatinine Ratio: 20 (ref 10–24)
BUN: 56 mg/dL — ABNORMAL HIGH (ref 10–36)
CO2: 24 mmol/L (ref 20–29)
Calcium: 8.5 mg/dL — ABNORMAL LOW (ref 8.6–10.2)
Chloride: 97 mmol/L (ref 96–106)
Creatinine, Ser: 2.76 mg/dL — ABNORMAL HIGH (ref 0.76–1.27)
GFR calc Af Amer: 22 mL/min/{1.73_m2} — ABNORMAL LOW (ref >59)
GFR calc non Af Amer: 19 mL/min/{1.73_m2} — ABNORMAL LOW (ref >59)
Glucose: 100 mg/dL — ABNORMAL HIGH (ref 65–99)
Potassium: 3.2 mmol/L — ABNORMAL LOW (ref 3.5–5.2)
Sodium: 140 mmol/L (ref 134–144)

## 2019-01-17 LAB — CBC
Hematocrit: 30.1 % — ABNORMAL LOW (ref 37.5–51.0)
Hemoglobin: 10.4 g/dL — ABNORMAL LOW (ref 13.0–17.7)
MCH: 34.1 pg — ABNORMAL HIGH (ref 26.6–33.0)
MCHC: 34.6 g/dL (ref 31.5–35.7)
MCV: 99 fL — ABNORMAL HIGH (ref 79–97)
Platelets: 122 10*3/uL — ABNORMAL LOW (ref 150–450)
RBC: 3.05 x10E6/uL — ABNORMAL LOW (ref 4.14–5.80)
RDW: 13.4 % (ref 11.6–15.4)
WBC: 5.3 10*3/uL (ref 3.4–10.8)

## 2019-01-17 MED ORDER — POTASSIUM CHLORIDE ER 10 MEQ PO TBCR: 10.0000 meq | EXTENDED_RELEASE_TABLET | Freq: Every day | ORAL | 3 refills | Status: AC

## 2019-01-17 NOTE — Patient Instructions (Signed)
Medication Instructions:  Your physician recommends that you continue on your current medications as directed. Please refer to the Current Medication list given to you today.  *If you need a refill on your cardiac medications before your next appointment, please call your pharmacy*  Lab Work: BMET and CBC today  If you have labs (blood work) drawn today and your tests are completely normal, you will receive your results only by: Marland Kitchen MyChart Message (if you have MyChart) OR . A paper copy in the mail If you have any lab test that is abnormal or we need to change your treatment, we will call you to review the results.  Testing/Procedures: None  Follow-Up: At [facility], you and your health needs are our priority.  As part of our continuing mission to provide you with exceptional heart care, we have created designated Provider Care Teams.  These Care Teams include your primary Cardiologist (physician) and Advanced Practice Providers (APPs -  Physician Assistants and Nurse Practitioners) who all work together to provide you with the care you need, when you need it.  Your next appointment:   2-3 weeks  The format for your next appointment:   In Person  Provider:   You may see Sinclair Grooms, MD or one of the following Advanced Practice Providers on your designated Care Team:    Truitt Merle, NP  Cecilie Kicks, NP  Kathyrn Drown, NP   Other Instructions  Monitor blood pressure and if you see the top number consistently below 90, contact the office.

## 2019-01-20 ENCOUNTER — Inpatient Hospital Stay (HOSPITAL_COMMUNITY)
Admission: EM | Admit: 2019-01-20 | Discharge: 2019-02-11 | DRG: 177 | Disposition: E | Payer: Medicare Other | Attending: Internal Medicine | Admitting: Internal Medicine

## 2019-01-20 ENCOUNTER — Emergency Department (HOSPITAL_COMMUNITY): Payer: Medicare Other

## 2019-01-20 ENCOUNTER — Telehealth: Payer: Self-pay | Admitting: Interventional Cardiology

## 2019-01-20 ENCOUNTER — Encounter (HOSPITAL_COMMUNITY): Payer: Self-pay

## 2019-01-20 ENCOUNTER — Ambulatory Visit: Payer: Medicare Other | Admitting: Interventional Cardiology

## 2019-01-20 ENCOUNTER — Other Ambulatory Visit: Payer: Self-pay

## 2019-01-20 DIAGNOSIS — Z955 Presence of coronary angioplasty implant and graft: Secondary | ICD-10-CM | POA: Diagnosis not present

## 2019-01-20 DIAGNOSIS — I5021 Acute systolic (congestive) heart failure: Secondary | ICD-10-CM

## 2019-01-20 DIAGNOSIS — Z8249 Family history of ischemic heart disease and other diseases of the circulatory system: Secondary | ICD-10-CM

## 2019-01-20 DIAGNOSIS — Z951 Presence of aortocoronary bypass graft: Secondary | ICD-10-CM

## 2019-01-20 DIAGNOSIS — I5043 Acute on chronic combined systolic (congestive) and diastolic (congestive) heart failure: Secondary | ICD-10-CM | POA: Diagnosis present

## 2019-01-20 DIAGNOSIS — N179 Acute kidney failure, unspecified: Secondary | ICD-10-CM | POA: Diagnosis present

## 2019-01-20 DIAGNOSIS — E785 Hyperlipidemia, unspecified: Secondary | ICD-10-CM | POA: Diagnosis present

## 2019-01-20 DIAGNOSIS — I10 Essential (primary) hypertension: Secondary | ICD-10-CM | POA: Diagnosis not present

## 2019-01-20 DIAGNOSIS — D638 Anemia in other chronic diseases classified elsewhere: Secondary | ICD-10-CM | POA: Diagnosis present

## 2019-01-20 DIAGNOSIS — J9601 Acute respiratory failure with hypoxia: Secondary | ICD-10-CM | POA: Diagnosis present

## 2019-01-20 DIAGNOSIS — Z881 Allergy status to other antibiotic agents status: Secondary | ICD-10-CM

## 2019-01-20 DIAGNOSIS — I2510 Atherosclerotic heart disease of native coronary artery without angina pectoris: Secondary | ICD-10-CM | POA: Diagnosis present

## 2019-01-20 DIAGNOSIS — Z8673 Personal history of transient ischemic attack (TIA), and cerebral infarction without residual deficits: Secondary | ICD-10-CM

## 2019-01-20 DIAGNOSIS — Z66 Do not resuscitate: Secondary | ICD-10-CM | POA: Diagnosis present

## 2019-01-20 DIAGNOSIS — I5041 Acute combined systolic (congestive) and diastolic (congestive) heart failure: Secondary | ICD-10-CM

## 2019-01-20 DIAGNOSIS — N184 Chronic kidney disease, stage 4 (severe): Secondary | ICD-10-CM | POA: Diagnosis present

## 2019-01-20 DIAGNOSIS — Z79899 Other long term (current) drug therapy: Secondary | ICD-10-CM | POA: Diagnosis not present

## 2019-01-20 DIAGNOSIS — E876 Hypokalemia: Secondary | ICD-10-CM | POA: Diagnosis present

## 2019-01-20 DIAGNOSIS — Z6824 Body mass index (BMI) 24.0-24.9, adult: Secondary | ICD-10-CM

## 2019-01-20 DIAGNOSIS — M109 Gout, unspecified: Secondary | ICD-10-CM | POA: Diagnosis present

## 2019-01-20 DIAGNOSIS — D539 Nutritional anemia, unspecified: Secondary | ICD-10-CM | POA: Diagnosis present

## 2019-01-20 DIAGNOSIS — U071 COVID-19: Secondary | ICD-10-CM | POA: Diagnosis present

## 2019-01-20 DIAGNOSIS — Z87891 Personal history of nicotine dependence: Secondary | ICD-10-CM | POA: Diagnosis not present

## 2019-01-20 DIAGNOSIS — I25709 Atherosclerosis of coronary artery bypass graft(s), unspecified, with unspecified angina pectoris: Secondary | ICD-10-CM | POA: Diagnosis present

## 2019-01-20 DIAGNOSIS — I130 Hypertensive heart and chronic kidney disease with heart failure and stage 1 through stage 4 chronic kidney disease, or unspecified chronic kidney disease: Secondary | ICD-10-CM | POA: Diagnosis present

## 2019-01-20 DIAGNOSIS — R634 Abnormal weight loss: Secondary | ICD-10-CM | POA: Diagnosis present

## 2019-01-20 DIAGNOSIS — Z7982 Long term (current) use of aspirin: Secondary | ICD-10-CM | POA: Diagnosis not present

## 2019-01-20 DIAGNOSIS — E7849 Other hyperlipidemia: Secondary | ICD-10-CM | POA: Diagnosis not present

## 2019-01-20 DIAGNOSIS — J1289 Other viral pneumonia: Secondary | ICD-10-CM | POA: Diagnosis present

## 2019-01-20 DIAGNOSIS — R1310 Dysphagia, unspecified: Secondary | ICD-10-CM

## 2019-01-20 DIAGNOSIS — I708 Atherosclerosis of other arteries: Secondary | ICD-10-CM | POA: Diagnosis present

## 2019-01-20 DIAGNOSIS — Z888 Allergy status to other drugs, medicaments and biological substances status: Secondary | ICD-10-CM

## 2019-01-20 DIAGNOSIS — Z9889 Other specified postprocedural states: Secondary | ICD-10-CM

## 2019-01-20 DIAGNOSIS — R197 Diarrhea, unspecified: Secondary | ICD-10-CM | POA: Diagnosis present

## 2019-01-20 DIAGNOSIS — J1282 Pneumonia due to coronavirus disease 2019: Secondary | ICD-10-CM

## 2019-01-20 DIAGNOSIS — Z85828 Personal history of other malignant neoplasm of skin: Secondary | ICD-10-CM

## 2019-01-20 DIAGNOSIS — I509 Heart failure, unspecified: Secondary | ICD-10-CM

## 2019-01-20 DIAGNOSIS — Z95820 Peripheral vascular angioplasty status with implants and grafts: Secondary | ICD-10-CM

## 2019-01-20 LAB — COMPREHENSIVE METABOLIC PANEL
ALT: 37 U/L (ref 0–44)
AST: 45 U/L — ABNORMAL HIGH (ref 15–41)
Albumin: 2.7 g/dL — ABNORMAL LOW (ref 3.5–5.0)
Alkaline Phosphatase: 123 U/L (ref 38–126)
Anion gap: 15 (ref 5–15)
BUN: 64 mg/dL — ABNORMAL HIGH (ref 8–23)
CO2: 24 mmol/L (ref 22–32)
Calcium: 8.3 mg/dL — ABNORMAL LOW (ref 8.9–10.3)
Chloride: 101 mmol/L (ref 98–111)
Creatinine, Ser: 3.01 mg/dL — ABNORMAL HIGH (ref 0.61–1.24)
GFR calc Af Amer: 20 mL/min — ABNORMAL LOW (ref >60)
GFR calc non Af Amer: 17 mL/min — ABNORMAL LOW (ref >60)
Glucose, Bld: 111 mg/dL — ABNORMAL HIGH (ref 70–99)
Potassium: 3.2 mmol/L — ABNORMAL LOW (ref 3.5–5.1)
Sodium: 140 mmol/L (ref 135–145)
Total Bilirubin: 2 mg/dL — ABNORMAL HIGH (ref 0.3–1.2)
Total Protein: 6.1 g/dL — ABNORMAL LOW (ref 6.5–8.1)

## 2019-01-20 LAB — CBC WITH DIFFERENTIAL/PLATELET
Abs Immature Granulocytes: 0.07 10*3/uL (ref 0.00–0.07)
Basophils Absolute: 0 10*3/uL (ref 0.0–0.1)
Basophils Relative: 0 %
Eosinophils Absolute: 0 10*3/uL (ref 0.0–0.5)
Eosinophils Relative: 0 %
HCT: 31.1 % — ABNORMAL LOW (ref 39.0–52.0)
Hemoglobin: 10.3 g/dL — ABNORMAL LOW (ref 13.0–17.0)
Immature Granulocytes: 1 %
Lymphocytes Relative: 6 %
Lymphs Abs: 0.4 10*3/uL — ABNORMAL LOW (ref 0.7–4.0)
MCH: 34.7 pg — ABNORMAL HIGH (ref 26.0–34.0)
MCHC: 33.1 g/dL (ref 30.0–36.0)
MCV: 104.7 fL — ABNORMAL HIGH (ref 80.0–100.0)
Monocytes Absolute: 0.3 10*3/uL (ref 0.1–1.0)
Monocytes Relative: 5 %
Neutro Abs: 5.5 10*3/uL (ref 1.7–7.7)
Neutrophils Relative %: 88 %
Platelets: 148 10*3/uL — ABNORMAL LOW (ref 150–400)
RBC: 2.97 MIL/uL — ABNORMAL LOW (ref 4.22–5.81)
RDW: 15.5 % (ref 11.5–15.5)
WBC: 6.2 10*3/uL (ref 4.0–10.5)
nRBC: 0 % (ref 0.0–0.2)

## 2019-01-20 LAB — URINALYSIS, ROUTINE W REFLEX MICROSCOPIC
Bilirubin Urine: NEGATIVE
Glucose, UA: NEGATIVE mg/dL
Ketones, ur: NEGATIVE mg/dL
Leukocytes,Ua: NEGATIVE
Nitrite: NEGATIVE
Protein, ur: 30 mg/dL — AB
Specific Gravity, Urine: 1.013 (ref 1.005–1.030)
pH: 5 (ref 5.0–8.0)

## 2019-01-20 LAB — BRAIN NATRIURETIC PEPTIDE: B Natriuretic Peptide: 3833.7 pg/mL — ABNORMAL HIGH (ref 0.0–100.0)

## 2019-01-20 MED ORDER — ONDANSETRON HCL 4 MG/2ML IJ SOLN
4.0000 mg | Freq: Once | INTRAMUSCULAR | Status: AC
  Administered 2019-01-20: 4 mg via INTRAVENOUS
  Filled 2019-01-20: qty 2

## 2019-01-20 MED ORDER — CARVEDILOL 3.125 MG PO TABS
3.1250 mg | ORAL_TABLET | Freq: Two times a day (BID) | ORAL | Status: DC
  Administered 2019-01-20 – 2019-01-25 (×9): 3.125 mg via ORAL
  Filled 2019-01-20 (×10): qty 1

## 2019-01-20 MED ORDER — ATORVASTATIN CALCIUM 40 MG PO TABS
40.0000 mg | ORAL_TABLET | Freq: Every day | ORAL | Status: DC
  Administered 2019-01-21 – 2019-01-24 (×4): 40 mg via ORAL
  Filled 2019-01-20 (×5): qty 1

## 2019-01-20 MED ORDER — SODIUM CHLORIDE 0.9% FLUSH
3.0000 mL | Freq: Two times a day (BID) | INTRAVENOUS | Status: DC
  Administered 2019-01-21 – 2019-01-24 (×6): 3 mL via INTRAVENOUS

## 2019-01-20 MED ORDER — ALLOPURINOL 100 MG PO TABS
100.0000 mg | ORAL_TABLET | Freq: Every day | ORAL | Status: DC
  Administered 2019-01-20 – 2019-01-25 (×5): 100 mg via ORAL
  Filled 2019-01-20 (×5): qty 1

## 2019-01-20 MED ORDER — ONDANSETRON HCL 4 MG/2ML IJ SOLN: 4.0000 mg | Freq: Four times a day (QID) | INTRAMUSCULAR | Status: DC | PRN

## 2019-01-20 MED ORDER — HEPARIN SODIUM (PORCINE) 5000 UNIT/ML IJ SOLN
5000.0000 [IU] | Freq: Three times a day (TID) | INTRAMUSCULAR | Status: DC
  Administered 2019-01-20 – 2019-01-25 (×12): 5000 [IU] via SUBCUTANEOUS
  Filled 2019-01-20 (×13): qty 1

## 2019-01-20 MED ORDER — ACETAMINOPHEN 325 MG PO TABS: 325.0000 mg | ORAL_TABLET | Freq: Four times a day (QID) | ORAL | Status: DC | PRN

## 2019-01-20 MED ORDER — ASPIRIN EC 81 MG PO TBEC
81.0000 mg | DELAYED_RELEASE_TABLET | Freq: Every day | ORAL | Status: DC
  Administered 2019-01-21 – 2019-01-24 (×4): 81 mg via ORAL
  Filled 2019-01-20 (×5): qty 1

## 2019-01-20 MED ORDER — SODIUM CHLORIDE 0.9 % IV SOLN
250.0000 mL | INTRAVENOUS | Status: DC | PRN
  Administered 2019-01-25: via INTRAVENOUS

## 2019-01-20 MED ORDER — ONDANSETRON HCL 4 MG PO TABS: 4.0000 mg | ORAL_TABLET | Freq: Three times a day (TID) | ORAL | Status: DC | PRN

## 2019-01-20 MED ORDER — NITROGLYCERIN 0.4 MG SL SUBL: 0.4000 mg | SUBLINGUAL_TABLET | SUBLINGUAL | Status: DC | PRN

## 2019-01-20 MED ORDER — MEGESTROL ACETATE 400 MG/10ML PO SUSP
400.0000 mg | Freq: Every day | ORAL | Status: DC
  Administered 2019-01-21 – 2019-01-24 (×4): 400 mg via ORAL
  Filled 2019-01-20 (×5): qty 10

## 2019-01-20 MED ORDER — SODIUM CHLORIDE 0.9% FLUSH: 3.0000 mL | INTRAVENOUS | Status: DC | PRN

## 2019-01-20 MED ORDER — POTASSIUM CHLORIDE CRYS ER 10 MEQ PO TBCR
10.0000 meq | EXTENDED_RELEASE_TABLET | Freq: Every day | ORAL | Status: DC
  Administered 2019-01-20 – 2019-01-24 (×5): 10 meq via ORAL
  Filled 2019-01-20 (×8): qty 1

## 2019-01-20 MED ORDER — FUROSEMIDE 10 MG/ML IJ SOLN
40.0000 mg | Freq: Once | INTRAMUSCULAR | Status: AC
  Administered 2019-01-20: 40 mg via INTRAVENOUS
  Filled 2019-01-20: qty 4

## 2019-01-20 MED ORDER — VITAMIN B-12 100 MCG PO TABS
100.0000 ug | ORAL_TABLET | Freq: Every day | ORAL | Status: DC
  Administered 2019-01-21 – 2019-01-24 (×4): 100 ug via ORAL
  Filled 2019-01-20 (×6): qty 1

## 2019-01-20 MED ORDER — FUROSEMIDE 10 MG/ML IJ SOLN
20.0000 mg | Freq: Two times a day (BID) | INTRAMUSCULAR | Status: DC
  Administered 2019-01-21 – 2019-01-22 (×3): 20 mg via INTRAVENOUS
  Filled 2019-01-20 (×3): qty 2

## 2019-01-20 MED ORDER — ACETAMINOPHEN 325 MG PO TABS
650.0000 mg | ORAL_TABLET | ORAL | Status: DC | PRN
  Administered 2019-01-25: 650 mg via ORAL
  Filled 2019-01-20: qty 2

## 2019-01-20 NOTE — ED Provider Notes (Addendum)
[facility] EMERGENCY DEPARTMENT Provider Note   CSN: [id number] Arrival date & time: 02/06/2019  1819     History   Chief Complaint Chief Complaint  Patient presents with  . Fatigue  . Shortness of Breath  . Chest Pain    HPI Danny [name] is a 85 y.o. male.     HPI  This elderly male presents with his daughter who provides much of the HPI.  Patient has substantial hearing difficulty, but when spoken to directly, loudly, he does answer questions appropriately. Patient has multiple medical issues including CKD, CAD and CHF. Over the past 2 weeks, and more prominently over the past 2 days he has had worsening weakness, and increasing cough. No reported fever, no vomiting, no diarrhea, no abdominal pain, no chest pain. However, with his worsening weakness, and increasing anorexia, his physicians have had multiple conversations with him and family, and multiple medications have been adjusted, including transition from one diuretic to another. In spite of this, no relief with anything, and with progress weakness, cough, he presents for evaluation. Notably, EMS reports the patient had hypoxia, with a saturation of 80% on room air on their arrival, this improved to 96% with 10 L nasal cannula. Currently the patient is using 2 L via nasal cannula for saturation of 93%.  Past Medical History:  Diagnosis Date  . Basal cell carcinoma of nose    "right side; burned it off"  . Carotid artery disease ([place])    a. s/p prior carotid endarterectomy.  . Cerebrovascular disease   . Chronic kidney disease (CKD), stage III (moderate)   . Coronary artery disease   . ED (erectile dysfunction)   . History of gout   . Hyperlipidemia   . Hypertension   . Insomnia   . Myocardial infarction ([place]) 2002  . PVD (peripheral vascular disease) ([place])    a. s/p right and left common iliac artery angioplasty with bilateral stent placement.  . Syncope   . Unequal blood pressure in upper  extremities     Patient Active Problem List   Diagnosis Date Noted  . Acute CHF (congestive heart failure) ([place]) 01/19/2019  . Near syncope 12/19/2017  . Gout 12/19/2017  . Anemia of chronic disease 12/19/2017  . Stenosis of left subclavian artery ([place]) 06/05/2016  . History of bilateral carotid endarterectomy 06/04/2016  . PAD (peripheral artery disease) ([place]) 08/05/2014  . Essential hypertension, benign 02/12/2013  . Coronary artery disease involving coronary bypass graft of native heart with angina pectoris ([place]) 02/12/2013  . CKD (chronic kidney disease) stage 4, GFR 15-29 ml/min (H[facility]) 02/12/2013  . Hyperlipidemia 02/12/2013  . C. difficile colitis 08/26/2011  . Hypotension 08/24/2011  . Anemia 08/24/2011    Past Surgical History:  Procedure Laterality Date  . ABDOMINAL HERNIA REPAIR  X 4  . APPENDECTOMY    . CAROTID ENDARTERECTOMY Bilateral   . CORONARY ANGIOPLASTY WITH STENT PLACEMENT     "~ 6 months before bypass"  . CORONARY ARTERY BYPASS GRAFT  2002   "CABG X4"  . HERNIA REPAIR    . PERIPHERAL VASCULAR CATHETERIZATION N/A 08/05/2014   Procedure: Abdominal Aortogram;  Surgeon: Wellington Hampshire, MD;  Location: [facility];  Service: Cardiovascular;  Laterality: N/A;  . PERIPHERAL VASCULAR CATHETERIZATION Bilateral 08/05/2014   Procedure: Peripheral Vascular Intervention;  Surgeon: Wellington Hampshire, MD;  Location: [facility];  Service: Cardiovascular;  Laterality: Bilateral;  iliacs  Home Medications    Prior to Admission medications   Medication Sig Start Date End Date Taking? Authorizing Provider  allopurinol (ZYLOPRIM) 100 MG tablet Take 1 tablet by mouth at bedtime.  06/05/16   [provider]  aspirin EC 81 MG tablet Take 81 mg by mouth daily.    [provider]  atorvastatin (LIPITOR) 40 MG tablet Take 40 mg by mouth daily at 6 PM.     [provider]  carvedilol (COREG) 3.125 MG tablet Take 1 tablet (3.125 mg  total) by mouth 2 (two) times daily. 11/11/18   Belva Crome, MD  cyanocobalamin 100 MCG tablet Take 100 mcg by mouth daily.    [provider]  nitroGLYCERIN (NITROSTAT) 0.4 MG SL tablet Place 0.4 mg under the tongue every 5 (five) minutes as needed for chest pain.     [provider]  ondansetron (ZOFRAN) 4 MG tablet Take 4 mg by mouth as needed. 01/15/19   [provider]  potassium chloride (KLOR-CON) 10 MEQ tablet Take 1 tablet (10 mEq total) by mouth daily. 01/17/19 04/17/19  Belva Crome, MD  torsemide (DEMADEX) 20 MG tablet Take 40 mg by mouth 2 (two) times daily.    [provider]    Family History Family History  Problem Relation Age of Onset  . Heart failure Mother   . Heart failure Father   . Heart attack Neg Hx   . Hypertension Neg Hx     Social History Social History   Tobacco Use  . Smoking status: Former Smoker    Packs/day: 1.00    Years: 56.00    Pack years: 56.00    Types: Cigarettes  . Smokeless tobacco: Never Used  . Tobacco comment: "stopped smoking when I had MI in 2002"  Substance Use Topics  . Alcohol use: No  . Drug use: No     Allergies   Bidil [isosorb dinitrate-hydralazine] and Clindamycin/lincomycin   Review of Systems Review of Systems  Constitutional:       Per HPI, otherwise negative  HENT:       Per HPI, otherwise negative  Respiratory:       Per HPI, otherwise negative  Cardiovascular:       Per HPI, otherwise negative  Gastrointestinal: Positive for nausea. Negative for abdominal pain and vomiting.  Endocrine:       Negative aside from HPI  Genitourinary:       Neg aside from HPI   Musculoskeletal:       Per HPI, otherwise negative  Skin: Negative.   Neurological: Positive for weakness. Negative for syncope.     Physical Exam Updated Vital Signs BP (!) 129/55   Pulse 76   Temp (!) 97.5 F (36.4 C) (Oral)   Resp 20   SpO2 94%   Physical Exam Vitals signs and nursing note  reviewed.  Constitutional:      General: He is not in acute distress.    Appearance: He is well-developed. He is ill-appearing.     Comments: Chronically ill-appearing elderly male awake and alert.  Patient is interactive, but has very poor hearing, interacts only occasionally.  HENT:     Head: Normocephalic and atraumatic.  Eyes:     Conjunctiva/sclera: Conjunctivae normal.  Cardiovascular:     Rate and Rhythm: Normal rate and regular rhythm.  Pulmonary:     Effort: Pulmonary effort is normal. No respiratory distress.     Breath sounds: No stridor.  Abdominal:  General: There is no distension.  Musculoskeletal:     Right lower leg: Edema present.     Left lower leg: Edema present.  Skin:    General: Skin is warm and dry.  Neurological:     Mental Status: He is alert and oriented to person, place, and time.      ED Treatments / Results  Labs (all labs ordered are listed, but only abnormal results are displayed) Labs Reviewed  COMPREHENSIVE METABOLIC PANEL - Abnormal; Notable for the following components:      Result Value   Potassium 3.2 (*)    Glucose, Bld 111 (*)    BUN 64 (*)    Creatinine, Ser 3.01 (*)    Calcium 8.3 (*)    Total Protein 6.1 (*)    Albumin 2.7 (*)    AST 45 (*)    Total Bilirubin 2.0 (*)    GFR calc non Af Amer 17 (*)    GFR calc Af Amer 20 (*)    All other components within normal limits  CBC WITH DIFFERENTIAL/PLATELET - Abnormal; Notable for the following components:   RBC 2.97 (*)    Hemoglobin 10.3 (*)    HCT 31.1 (*)    MCV 104.7 (*)    MCH 34.7 (*)    Platelets 148 (*)    Lymphs Abs 0.4 (*)    All other components within normal limits  BRAIN NATRIURETIC PEPTIDE - Abnormal; Notable for the following components:   B Natriuretic Peptide 3,833.7 (*)    All other components within normal limits  URINALYSIS, ROUTINE W REFLEX MICROSCOPIC - Abnormal; Notable for the following components:   APPearance HAZY (*)    Hgb urine dipstick SMALL  (*)    Protein, ur 30 (*)    Bacteria, UA RARE (*)    All other components within normal limits  SARS CORONAVIRUS 2 (TAT 6-24 HRS)    EMS rhythm strip rate 89, nonspecific T wave changes, intraventricular conduction delay, artifact, abnormal   EKG EKG Interpretation  Date/Time:  Monday January 20 2019 18:24:22 EST Ventricular Rate:  80 PR Interval:    QRS Duration: 93 QT Interval:  387 QTC Calculation: 447 R Axis:   -23 Text Interpretation: Sinus rhythm Left axis deviation T wave abnormality Abnormal ekg Confirmed by Carmin Muskrat [phone]) on 01/24/2019 6:35:48 PM   Radiology Dg Chest Port 1 View  Result Date: 02/01/2019 CLINICAL DATA:  Shortness of breath EXAM: PORTABLE CHEST 1 VIEW COMPARISON:  12/19/2017 FINDINGS: Post sternotomy changes. Apices are obscured by the patient's neck and chin. Mild cardiomegaly. Interim development of interstitial and peripheral ground-glass opacity and consolidations. No pneumothorax. IMPRESSION: Interim development of diffuse bilateral interstitial and ground-glass opacities and patchy consolidations, some of which are peripheral in distribution. Findings suspect for multifocal infection, particularly atypical or viral pneumonia. Electronically Signed   By: Donavan Foil M.D.   On: 01/15/2019 19:10    Procedures Procedures (including critical care time)  CRITICAL CARE Performed by: Carmin Muskrat Total critical care time: 40 minutes Critical care time was exclusive of separately billable procedures and treating other patients. Critical care was necessary to treat or prevent imminent or life-threatening deterioration. Critical care was time spent personally by me on the following activities: development of treatment plan with patient and/or surrogate as well as nursing, discussions with consultants, evaluation of patient's response to treatment, examination of patient, obtaining history from patient or surrogate, ordering and performing  treatments and interventions, ordering and review of  laboratory studies, ordering and review of radiographic studies, pulse oximetry and re-evaluation of patient's condition.   Medications Ordered in ED Medications  ondansetron (ZOFRAN) injection 4 mg (4 mg Intravenous Given 01/16/2019 2019)  furosemide (LASIX) injection 40 mg (40 mg Intravenous Given 01/21/2019 2019)     Initial Impression / Assessment and Plan / ED Course  I have reviewed the triage vital signs and the nursing notes.  Pertinent labs & imaging results that were available during my care of the patient were reviewed by me and considered in my medical decision making (see chart for details).    Initial labs notable for elevated BNP, greater than 3800, and creatinine also elevated, though not as substantially compared to baseline.     8:38 PM Patient in similar condition, receiving supplemental oxygen, preparing to receive IV Lasix. I discussed all findings with him and his daughter. In particular we discussed concern for heart failure exacerbation, complicated by the patient's chronic kidney disease. X-ray notable for interstitial edema.  Patient is afebrile, though he has mild cough, there is some suspicion for fluid overload status, versus Covid, given his description of nausea, fatigue as well. Covid test pending. No early evidence for concurrent ischemia. We discussed goals of care, and the patient, daughter note that he is DNR. With concern for acute on chronic heart failure, complicated by CKD, as above, the patient will require admission for further monitoring, management.  I discussed his case at bedside with our internal medicine colleagues for admission.   February 21, 2023 addendum: Patient died 2023/02/18) Final Clinical Impressions(s) / ED Diagnoses   Final diagnoses:  Acute combined systolic and diastolic congestive heart failure ([place])      Carmin Muskrat, MD 02/01/2019 2042    Carmin Muskrat, MD 02/21/19 [phone]

## 2019-01-20 NOTE — H&P (Signed)
History and Physical   Danny [name] NFA:[id number] DOB: 01/16/1927 DOA: 01/30/2019  Referring MD/NP/PA: Dr. Vanita Panda  PCP: Lavone Orn, MD   Outpatient Specialists: Dr. Conchita Paris, cardiology  Patient coming from: Home  Chief Complaint: Shortness of breath and chest pain  HPI: Danny [name] is a 85 y.o. male with medical history significant of systolic dysfunction CHF with EF of 15%, history of CVA, chronic kidney disease stage III, coronary artery disease, hyperlipidemia, hypertension, peripheral vascular disease who presents to the ER with progressive shortness of breath cough fatigue and chest pain.  Patient has been on high-dose Lasix of 80 mg.  He has been weak and debilitated lately.  He has lost appetite and has not been functioning well.  Daughter is primary historian as patient has poor hearing.  He has had PND and orthopnea.  Also increasing lower extremity edema.  Patient is a DNR.  No fever or chills.  No exposure to COVID-19.  He has been admitted with acute on chronic CHF exacerbation with worsening renal function..  ED Course: Temperature 97.5, blood pressure 142/82, pulse 81, respiratory rate of 21 and oxygen sats 94% room air.  White count 6.2, hemoglobin 10.3 and platelets 148.  Sodium 140 potassium 3.2 chloride 101 CO2 24 BUN 64 creatinine 3.01 and calcium 8.3.  Glucose 111.  Albumin is 2.7 total bilirubin 2.0.  Chest x-ray showed interval development of diffuse bilateral interstitial and groundglass opacity and patchy consolidations.  Consistent with multifocal infection.  Patient being admitted to the hospital for treatment  Review of Systems: As per HPI otherwise 10 point review of systems negative.    Past Medical History:  Diagnosis Date  . Basal cell carcinoma of nose    "right side; burned it off"  . Carotid artery disease ([place])    a. s/p prior carotid endarterectomy.  . Cerebrovascular disease   . Chronic kidney disease (CKD), stage III (moderate)   .  Coronary artery disease   . ED (erectile dysfunction)   . History of gout   . Hyperlipidemia   . Hypertension   . Insomnia   . Myocardial infarction ([place]) 2002  . PVD (peripheral vascular disease) ([place])    a. s/p right and left common iliac artery angioplasty with bilateral stent placement.  . Syncope   . Unequal blood pressure in upper extremities     Past Surgical History:  Procedure Laterality Date  . ABDOMINAL HERNIA REPAIR  X 4  . APPENDECTOMY    . CAROTID ENDARTERECTOMY Bilateral   . CORONARY ANGIOPLASTY WITH STENT PLACEMENT     "~ 6 months before bypass"  . CORONARY ARTERY BYPASS GRAFT  2002   "CABG X4"  . HERNIA REPAIR    . PERIPHERAL VASCULAR CATHETERIZATION N/A 08/05/2014   Procedure: Abdominal Aortogram;  Surgeon: Wellington Hampshire, MD;  Location: [facility];  Service: Cardiovascular;  Laterality: N/A;  . PERIPHERAL VASCULAR CATHETERIZATION Bilateral 08/05/2014   Procedure: Peripheral Vascular Intervention;  Surgeon: Wellington Hampshire, MD;  Location: [facility];  Service: Cardiovascular;  Laterality: Bilateral;  iliacs     reports that he has quit smoking. His smoking use included cigarettes. He has a 56.00 pack-year smoking history. He has never used smokeless tobacco. He reports that he does not drink alcohol or use drugs.  Allergies  Allergen Reactions  . Bidil [Isosorb Dinitrate-Hydralazine] Nausea Only and Other (See Comments)    Pt states he couldn't see, mind wasn't clear, and weakness  .  Clindamycin/Lincomycin Other (See Comments)    Pt states caused c-diff and hospitalization    Family History  Problem Relation Age of Onset  . Heart failure Mother   . Heart failure Father   . Heart attack Neg Hx   . Hypertension Neg Hx      Prior to Admission medications   Medication Sig Start Date End Date Taking? Authorizing Provider  acetaminophen (TYLENOL) 325 MG tablet Take 325-650 mg by mouth every 6 (six) hours as needed for mild pain or  headache.   Yes [provider]  allopurinol (ZYLOPRIM) 100 MG tablet Take 100 mg by mouth at bedtime.  06/05/16  Yes [provider]  aspirin EC 81 MG tablet Take 81 mg by mouth daily.   Yes [provider]  atorvastatin (LIPITOR) 40 MG tablet Take 40 mg by mouth daily at 6 PM.    Yes [provider]  carvedilol (COREG) 3.125 MG tablet Take 1 tablet (3.125 mg total) by mouth 2 (two) times daily. 11/11/18  Yes Belva Crome, MD  cyanocobalamin 100 MCG tablet Take 100 mcg by mouth daily.   Yes [provider]  nitroGLYCERIN (NITROSTAT) 0.4 MG SL tablet Place 0.4 mg under the tongue every 5 (five) minutes as needed for chest pain.    Yes [provider]  ondansetron (ZOFRAN) 4 MG tablet Take 4 mg by mouth every 8 (eight) hours as needed for nausea or vomiting.  01/15/19  Yes [provider]  potassium chloride (KLOR-CON) 10 MEQ tablet Take 1 tablet (10 mEq total) by mouth daily. 01/17/19 04/17/19  Belva Crome, MD  torsemide (DEMADEX) 20 MG tablet Take 40 mg by mouth 2 (two) times daily.    [provider]    Physical Exam: Vitals:   02/10/2019 1900 01/27/2019 2030 02/04/2019 2100 02/06/2019 2130  BP: (!) 129/55 122/65 136/74 115/60  Pulse:  81 81 77  Resp: 20 (!) 21 19 20   Temp:      TempSrc:      SpO2:  95% 96% 98%      Constitutional: NAD, calm, hard of hearing, chronically ill looking Vitals:   02/06/2019 1900 01/18/2019 2030 02/01/2019 2100 01/23/2019 2130  BP: (!) 129/55 122/65 136/74 115/60  Pulse:  81 81 77  Resp: 20 (!) 21 19 20   Temp:      TempSrc:      SpO2:  95% 96% 98%   Eyes: PERRL, lids and conjunctivae normal ENMT: Mucous membranes are moist. Posterior pharynx clear of any exudate or lesions.Normal dentition.  Neck: normal, supple, no masses, no thyromegaly, positive JVD Respiratory: clear to auscultation bilaterally, no wheezing, no crackles. Normal respiratory effort. No accessory muscle use.  Cardiovascular:  Regular rate and rhythm, systolic ejection murmurs / rubs / gallops.  1+ pedal edema. 2+ pedal pulses. No carotid bruits.  Abdomen: no tenderness, no masses palpated. No hepatosplenomegaly. Bowel sounds positive.  Musculoskeletal: no clubbing / cyanosis. No joint deformity upper and lower extremities. Good ROM, no contractures. Normal muscle tone.  Skin: no rashes, lesions, ulcers. No induration Neurologic: CN 2-12 grossly intact. Sensation intact, DTR normal. Strength 5/5 in all 4.  Psychiatric: Normal judgment and insight. Alert and oriented x 3.  Hard of hearing    Labs on Admission: I have personally reviewed following labs and imaging studies  CBC: Recent Labs  Lab 01/17/19 1144 02/04/2019 1847  WBC 5.3 6.2  NEUTROABS  --  5.5  HGB 10.4* 10.3*  HCT 30.1*  31.1*  MCV 99* 104.7*  PLT 122* 914*   Basic Metabolic Panel: Recent Labs  Lab 01/17/19 1144 02/10/2019 1847  NA 140 140  K 3.2* 3.2*  CL 97 101  CO2 24 24  GLUCOSE 100* 111*  BUN 56* 64*  CREATININE 2.76* 3.01*  CALCIUM 8.5* 8.3*   GFR: Estimated Creatinine Clearance: 14.7 mL/min (A) (by C-G formula based on SCr of 3.01 mg/dL (H)). Liver Function Tests: Recent Labs  Lab 02/05/2019 1847  AST 45*  ALT 37  ALKPHOS 123  BILITOT 2.0*  PROT 6.1*  ALBUMIN 2.7*   No results for input(s): LIPASE, AMYLASE in the last 168 hours. No results for input(s): AMMONIA in the last 168 hours. Coagulation Profile: No results for input(s): INR, PROTIME in the last 168 hours. Cardiac Enzymes: No results for input(s): CKTOTAL, CKMB, CKMBINDEX, TROPONINI in the last 168 hours. BNP (last 3 results) Recent Labs    09/03/18 1400 09/25/18 1431 10/30/18 1605  PROBNP 10,929* 10,644* 12,982*   HbA1C: No results for input(s): HGBA1C in the last 72 hours. CBG: No results for input(s): GLUCAP in the last 168 hours. Lipid Profile: No results for input(s): CHOL, HDL, LDLCALC, TRIG, CHOLHDL, LDLDIRECT in the last 72 hours. Thyroid  Function Tests: No results for input(s): TSH, T4TOTAL, FREET4, T3FREE, THYROIDAB in the last 72 hours. Anemia Panel: No results for input(s): VITAMINB12, FOLATE, FERRITIN, TIBC, IRON, RETICCTPCT in the last 72 hours. Urine analysis:    Component Value Date/Time   COLORURINE YELLOW 01/19/2019 1938   APPEARANCEUR HAZY (A) 01/12/2019 1938   LABSPEC 1.013 01/29/2019 1938   PHURINE 5.0 01/27/2019 1938   GLUCOSEU NEGATIVE 01/29/2019 1938   HGBUR SMALL (A) 01/17/2019 1938   BILIRUBINUR NEGATIVE 01/26/2019 1938   KETONESUR NEGATIVE 01/16/2019 1938   PROTEINUR 30 (A) 02/02/2019 1938   UROBILINOGEN 0.2 08/24/2011 1641   NITRITE NEGATIVE 01/28/2019 1938   LEUKOCYTESUR NEGATIVE 02/03/2019 1938   Sepsis Labs: @LABRCNTIP (procalcitonin:4,lacticidven:4) )No results found for this or any previous visit (from the past 240 hour(s)).   Radiological Exams on Admission: Dg Chest Port 1 View  Result Date: 01/23/2019 CLINICAL DATA:  Shortness of breath EXAM: PORTABLE CHEST 1 VIEW COMPARISON:  12/19/2017 FINDINGS: Post sternotomy changes. Apices are obscured by the patient's neck and chin. Mild cardiomegaly. Interim development of interstitial and peripheral ground-glass opacity and consolidations. No pneumothorax. IMPRESSION: Interim development of diffuse bilateral interstitial and ground-glass opacities and patchy consolidations, some of which are peripheral in distribution. Findings suspect for multifocal infection, particularly atypical or viral pneumonia. Electronically Signed   By: Donavan Foil M.D.   On: 01/16/2019 19:10    EKG: Independently reviewed.  Shows sinus rhythm with a rate of 80.  Low voltage EKG, nonspecific ST changes  Assessment/Plan Principal Problem:   Acute CHF (congestive heart failure) (HCC) Active Problems:   Hypokalemia   Essential hypertension, benign   Coronary artery disease involving coronary bypass graft of native heart with angina pectoris (HCC)   CKD (chronic kidney  disease) stage 4, GFR 15-29 ml/min (HCC)   Hyperlipidemia   History of bilateral carotid endarterectomy   Anemia of chronic disease     #1 acute exacerbation of CHF: Patient being admitted.  We will continue home regimen except for diuretics.  Start IV Lasix.  Cardiology consult in the morning.  #2 possible pneumonia: Chest x-ray suggestive of possible infection but no fever no chills and new leukocytosis.  We will observe closely.  #3 generalized weakness and debility: Most likely  as a result of advanced CHF with EF of only 15%.  PT OT once stable  #4 anemia of chronic disease: H&H stable.  Continue monitor  #5 hypertension: Blood pressure is well controlled.  Continue monitoring  #6 hyperlipidemia: Continue statin  #7 chronic kidney disease stage III: Slight worsening of renal function.  We will continue very close and tight monitoring.  Diuretics will have to be careful not to worsen renal function.  May need nephrology referral.  #8 hypokalemia: Replete potassium    DVT prophylaxis: Heparin Code Status: DNR Family Communication: Daughter who is at bedside Disposition Plan: To be determined Consults called: Cardiology and nephrology consult in the morning Admission status: Inpatient  Severity of Illness: The appropriate patient status for this patient is INPATIENT. Inpatient status is judged to be reasonable and necessary in order to provide the required intensity of service to ensure the patient's safety. The patient's presenting symptoms, physical exam findings, and initial radiographic and laboratory data in the context of their chronic comorbidities is felt to place them at high risk for further clinical deterioration. Furthermore, it is not anticipated that the patient will be medically stable for discharge from the hospital within 2 midnights of admission. The following factors support the patient status of inpatient.   " The patient's presenting symptoms include shortness  of breath. " The worrisome physical exam findings include lower extremity edema with coarse breath sounds. " The initial radiographic and laboratory data are worrisome because of chest x-ray findings suggestive of infection with CHF. " The chronic co-morbidities include advanced systolic dysfunction.   * I certify that at the point of admission it is my clinical judgment that the patient will require inpatient hospital care spanning beyond 2 midnights from the point of admission due to high intensity of service, high risk for further deterioration and high frequency of surveillance required.Barbette Merino MD [facility] Pager [phone]  If 7PM-7AM, please contact night-coverage [email or web address] Password [email or web address]  01/21/2019, 11:01 PM

## 2019-01-20 NOTE — ED Triage Notes (Signed)
Per [facility], pt from home w/ a c/o generalized weakness and SOB x2 days. Pt was found to be at 80% spo2 on RA with clear LS. 100% on 10 lpm via NRB. No complaints of pain. Pt has an EF of 15%. No LOC.   119/52 HR 80 rr 18

## 2019-01-20 NOTE — Telephone Encounter (Signed)
Spoke with Dr. Tamala Julian and he said to have pt hold Torsemide today and tomorrow, then restart Wednesday at 20mg  BID.  Spoke with daughter and went over recommendations.  Daughter verbalized understanding and was in agreement with this plan.

## 2019-01-20 NOTE — ED Notes (Signed)
Additional complaints of centrally located CP that radiates to the left side of his chest. Pain is an intermittent ache. Cough present x2 weeks. Nausea without vomiting. SOB. Pain is not reproducible upon palpation.

## 2019-01-20 NOTE — Telephone Encounter (Signed)
New message     Pt daughter is calling She says she would like for Anderson Malta to give her a call  She says her dad is weak and over the weakened has hardly eaten anything.  She is concerned that he is going to be dehydrated and she would like some guidance on what to do      Please call

## 2019-01-21 ENCOUNTER — Telehealth: Payer: Self-pay | Admitting: Interventional Cardiology

## 2019-01-21 DIAGNOSIS — U071 COVID-19: Secondary | ICD-10-CM | POA: Diagnosis present

## 2019-01-21 LAB — CBC
HCT: 32.8 % — ABNORMAL LOW (ref 39.0–52.0)
Hemoglobin: 10.6 g/dL — ABNORMAL LOW (ref 13.0–17.0)
MCH: 34.8 pg — ABNORMAL HIGH (ref 26.0–34.0)
MCHC: 32.3 g/dL (ref 30.0–36.0)
MCV: 107.5 fL — ABNORMAL HIGH (ref 80.0–100.0)
Platelets: 155 10*3/uL (ref 150–400)
RBC: 3.05 MIL/uL — ABNORMAL LOW (ref 4.22–5.81)
RDW: 15.7 % — ABNORMAL HIGH (ref 11.5–15.5)
WBC: 7.1 10*3/uL (ref 4.0–10.5)
nRBC: 0 % (ref 0.0–0.2)

## 2019-01-21 LAB — COMPREHENSIVE METABOLIC PANEL
ALT: 34 U/L (ref 0–44)
AST: 39 U/L (ref 15–41)
Albumin: 2.6 g/dL — ABNORMAL LOW (ref 3.5–5.0)
Alkaline Phosphatase: 124 U/L (ref 38–126)
Anion gap: 14 (ref 5–15)
BUN: 62 mg/dL — ABNORMAL HIGH (ref 8–23)
CO2: 25 mmol/L (ref 22–32)
Calcium: 8.4 mg/dL — ABNORMAL LOW (ref 8.9–10.3)
Chloride: 102 mmol/L (ref 98–111)
Creatinine, Ser: 2.85 mg/dL — ABNORMAL HIGH (ref 0.61–1.24)
GFR calc Af Amer: 22 mL/min — ABNORMAL LOW (ref >60)
GFR calc non Af Amer: 19 mL/min — ABNORMAL LOW (ref >60)
Glucose, Bld: 107 mg/dL — ABNORMAL HIGH (ref 70–99)
Potassium: 3.2 mmol/L — ABNORMAL LOW (ref 3.5–5.1)
Sodium: 141 mmol/L (ref 135–145)
Total Bilirubin: 2.7 mg/dL — ABNORMAL HIGH (ref 0.3–1.2)
Total Protein: 6.1 g/dL — ABNORMAL LOW (ref 6.5–8.1)

## 2019-01-21 LAB — FERRITIN: Ferritin: 517 ng/mL — ABNORMAL HIGH (ref 24–336)

## 2019-01-21 LAB — CBC WITH DIFFERENTIAL/PLATELET
Abs Immature Granulocytes: 0.07 10*3/uL (ref 0.00–0.07)
Basophils Absolute: 0 10*3/uL (ref 0.0–0.1)
Basophils Relative: 0 %
Eosinophils Absolute: 0 10*3/uL (ref 0.0–0.5)
Eosinophils Relative: 0 %
HCT: 33.2 % — ABNORMAL LOW (ref 39.0–52.0)
Hemoglobin: 10.8 g/dL — ABNORMAL LOW (ref 13.0–17.0)
Immature Granulocytes: 1 %
Lymphocytes Relative: 6 %
Lymphs Abs: 0.5 10*3/uL — ABNORMAL LOW (ref 0.7–4.0)
MCH: 34.6 pg — ABNORMAL HIGH (ref 26.0–34.0)
MCHC: 32.5 g/dL (ref 30.0–36.0)
MCV: 106.4 fL — ABNORMAL HIGH (ref 80.0–100.0)
Monocytes Absolute: 0.3 10*3/uL (ref 0.1–1.0)
Monocytes Relative: 4 %
Neutro Abs: 7.3 10*3/uL (ref 1.7–7.7)
Neutrophils Relative %: 89 %
Platelets: 163 10*3/uL (ref 150–400)
RBC: 3.12 MIL/uL — ABNORMAL LOW (ref 4.22–5.81)
RDW: 15.8 % — ABNORMAL HIGH (ref 11.5–15.5)
WBC: 8.2 10*3/uL (ref 4.0–10.5)
nRBC: 0 % (ref 0.0–0.2)

## 2019-01-21 LAB — TSH: TSH: 6.836 u[IU]/mL — ABNORMAL HIGH (ref 0.350–4.500)

## 2019-01-21 LAB — D-DIMER, QUANTITATIVE: D-Dimer, Quant: 1.78 ug/mL-FEU — ABNORMAL HIGH (ref 0.00–0.50)

## 2019-01-21 LAB — CREATININE, SERUM
Creatinine, Ser: 2.78 mg/dL — ABNORMAL HIGH (ref 0.61–1.24)
GFR calc Af Amer: 22 mL/min — ABNORMAL LOW (ref >60)
GFR calc non Af Amer: 19 mL/min — ABNORMAL LOW (ref >60)

## 2019-01-21 LAB — TROPONIN I (HIGH SENSITIVITY)
Troponin I (High Sensitivity): 120 ng/L (ref <18)
Troponin I (High Sensitivity): 134 ng/L (ref <18)
Troponin I (High Sensitivity): 161 ng/L (ref <18)

## 2019-01-21 LAB — C-REACTIVE PROTEIN: CRP: 6.2 mg/dL — ABNORMAL HIGH (ref <1.0)

## 2019-01-21 LAB — SARS CORONAVIRUS 2 (TAT 6-24 HRS): SARS Coronavirus 2: POSITIVE — AB

## 2019-01-21 LAB — PHOSPHORUS: Phosphorus: 3.5 mg/dL (ref 2.5–4.6)

## 2019-01-21 LAB — ABO/RH: ABO/RH(D): A POS

## 2019-01-21 LAB — MAGNESIUM: Magnesium: 1.6 mg/dL — ABNORMAL LOW (ref 1.7–2.4)

## 2019-01-21 MED ORDER — TRAZODONE HCL 100 MG PO TABS: 100.0000 mg | ORAL_TABLET | Freq: Every evening | ORAL | Status: DC | PRN

## 2019-01-21 MED ORDER — SODIUM CHLORIDE 0.9% FLUSH
3.0000 mL | Freq: Two times a day (BID) | INTRAVENOUS | Status: DC
  Administered 2019-01-21 – 2019-01-25 (×6): 3 mL via INTRAVENOUS

## 2019-01-21 MED ORDER — HYDROCOD POLST-CPM POLST ER 10-8 MG/5ML PO SUER
5.0000 mL | Freq: Two times a day (BID) | ORAL | Status: DC | PRN
  Administered 2019-01-21 – 2019-01-25 (×3): 5 mL via ORAL
  Filled 2019-01-21 (×3): qty 5

## 2019-01-21 MED ORDER — ALBUTEROL SULFATE HFA 108 (90 BASE) MCG/ACT IN AERS
2.0000 | INHALATION_SPRAY | Freq: Four times a day (QID) | RESPIRATORY_TRACT | Status: DC | PRN
  Administered 2019-01-23 – 2019-01-24 (×2): 2 via RESPIRATORY_TRACT
  Filled 2019-01-21: qty 6.7

## 2019-01-21 MED ORDER — SODIUM CHLORIDE 0.9 % IV SOLN
100.0000 mg | INTRAVENOUS | Status: DC
  Administered 2019-01-22 – 2019-01-24 (×3): 100 mg via INTRAVENOUS
  Filled 2019-01-21 (×5): qty 20

## 2019-01-21 MED ORDER — POTASSIUM CHLORIDE CRYS ER 20 MEQ PO TBCR
40.0000 meq | EXTENDED_RELEASE_TABLET | Freq: Once | ORAL | Status: AC
  Administered 2019-01-21: 40 meq via ORAL
  Filled 2019-01-21: qty 2

## 2019-01-21 MED ORDER — LACTULOSE 10 GM/15ML PO SOLN
10.0000 g | Freq: Every day | ORAL | Status: DC | PRN
  Administered 2019-01-23: 10 g via ORAL
  Filled 2019-01-21: qty 15

## 2019-01-21 MED ORDER — SODIUM CHLORIDE 0.9 % IV SOLN
200.0000 mg | Freq: Once | INTRAVENOUS | Status: AC
  Administered 2019-01-21: 200 mg via INTRAVENOUS
  Filled 2019-01-21: qty 40

## 2019-01-21 MED ORDER — DOCUSATE SODIUM 100 MG PO CAPS
100.0000 mg | ORAL_CAPSULE | Freq: Every day | ORAL | Status: DC
  Administered 2019-01-21 – 2019-01-24 (×4): 100 mg via ORAL
  Filled 2019-01-21 (×4): qty 1

## 2019-01-21 MED ORDER — GUAIFENESIN-DM 100-10 MG/5ML PO SYRP
10.0000 mL | ORAL_SOLUTION | ORAL | Status: DC | PRN
  Administered 2019-01-24: 10 mL via ORAL
  Filled 2019-01-21: qty 10

## 2019-01-21 MED ORDER — DEXAMETHASONE 6 MG PO TABS
6.0000 mg | ORAL_TABLET | Freq: Every day | ORAL | Status: DC
  Administered 2019-01-21 – 2019-01-24 (×4): 6 mg via ORAL
  Filled 2019-01-21 (×5): qty 2

## 2019-01-21 MED ORDER — ZINC SULFATE 220 (50 ZN) MG PO CAPS
220.0000 mg | ORAL_CAPSULE | Freq: Every day | ORAL | Status: DC
  Administered 2019-01-21 – 2019-01-24 (×4): 220 mg via ORAL
  Filled 2019-01-21 (×5): qty 1

## 2019-01-21 MED ORDER — VITAMIN C 500 MG PO TABS
500.0000 mg | ORAL_TABLET | Freq: Every day | ORAL | Status: DC
  Administered 2019-01-21 – 2019-01-24 (×4): 500 mg via ORAL
  Filled 2019-01-21 (×4): qty 1

## 2019-01-21 MED ORDER — ADULT MULTIVITAMIN W/MINERALS CH
1.0000 | ORAL_TABLET | Freq: Every day | ORAL | Status: DC
  Administered 2019-01-21 – 2019-01-24 (×4): 1 via ORAL
  Filled 2019-01-21 (×4): qty 1

## 2019-01-21 NOTE — Patient Care Conference (Signed)
Called and updated patient's daughter. All questions answered 

## 2019-01-21 NOTE — Progress Notes (Addendum)
PROGRESS NOTE    MAJID MCCRAVY  PJA:[id number] DOB: [birth year] DOA: 01/17/2019 PCP: Lavone Orn, MD    Brief Narrative:  85 y.o. male with medical history significant of systolic dysfunction CHF with EF of 15%, history of CVA, chronic kidney disease stage III, coronary artery disease, hyperlipidemia, hypertension, peripheral vascular disease who presents to the ER with progressive shortness of breath cough fatigue and chest pain.  Patient has been on high-dose Lasix of 80 mg.  He has been weak and debilitated lately.  He has lost appetite and has not been functioning well.  Daughter is primary historian as patient has poor hearing.  He has had PND and orthopnea.  Also increasing lower extremity edema.  Patient is a DNR.  No fever or chills.  No exposure to COVID-19.  He has been admitted with acute on chronic CHF exacerbation with worsening renal function..  ED Course: Temperature 97.5, blood pressure 142/82, pulse 81, respiratory rate of 21 and oxygen sats 94% room air.  White count 6.2, hemoglobin 10.3 and platelets 148.  Sodium 140 potassium 3.2 chloride 101 CO2 24 BUN 64 creatinine 3.01 and calcium 8.3.  Glucose 111.  Albumin is 2.7 total bilirubin 2.0.  Chest x-ray showed interval development of diffuse bilateral interstitial and groundglass opacity and patchy consolidations.  Consistent with multifocal infection  Assessment & Plan:   Principal Problem:   Acute CHF (congestive heart failure) (HCC) Active Problems:   Hypokalemia   Essential hypertension, benign   Coronary artery disease involving coronary bypass graft of native heart with angina pectoris (HCC)   CKD (chronic kidney disease) stage 4, GFR 15-29 ml/min (HCC)   Hyperlipidemia   History of bilateral carotid endarterectomy   Anemia of chronic disease   COVID-19   #1 acute exacerbation of CHF:  -Presented with volume overload and concerns of acute systolic CHF -BNP 3,419 on presentation -Pt is continued on IV lasix  bid at 20mg  since admit -Would follow strict I/o and daily wts -follow serial lytes -high sensitivity troponin mildly elevated at 120, remaining stable at 134 on recheck. Cont to follow serial trop. Suspect elevated trop in setting of acute heart failure  #2 COVID pneumonia:  -Chest x-ray reviewed with findings suggesting PNA -COVID is pos -Of note, pt was recently seen in Cardiology office on 11/6 for worsening sob, thought to be related to heart failure -On chart review, pt's daughter was concerned of worsening diarrhea on 11/4 -CRP of 6.2, ddimer 1.78 -Pt is now on dexamethasone IV, remdesivir, vit c and zinc -Currently on 7LNC, wean as tolerated  #3 generalized weakness and debility:  -Most likely as a result of advanced CHF with EF of only 15% in setting of COVID -PT OT consulted  #4 anemia of chronic disease:  -labs reviewed. H&H stable.  -repeat cbc in AM  #5 hypertension:  -Blood pressure is well controlled.  Continue monitoring  #6 hyperlipidemia:  -Continue statin as tolerated  #7 chronic kidney disease stage III:  -Slight worsening of renal function.   -Cr improved this AM, seems to be tolerating diuretics -Continue lasix as tolerated  #8 hypokalemia:  -Replaced -Repeat bmet in AM   DVT prophylaxis: Heparin subQ Code Status: DNR Family Communication: Pt in room, family not at bedside Disposition Plan: Uncertain at this time  Consultants:     Procedures:     Antimicrobials: Anti-infectives (From admission, onward)   Start     Dose/Rate Route Frequency Ordered Stop   01/22/19 1000  remdesivir  100 mg in sodium chloride 0.9 % 250 mL IVPB     100 mg 500 mL/hr over 30 Minutes Intravenous Every 24 hours 01/21/19 0330 01/26/19 0959   01/21/19 0530  remdesivir 200 mg in sodium chloride 0.9 % 250 mL IVPB     200 mg 500 mL/hr over 30 Minutes Intravenous Once 01/21/19 0330 01/21/19 0543       Subjective: Complaining of some sob this AM   Objective: Vitals:   01/21/19 0556 01/21/19 0600 01/21/19 0900 01/21/19 1200  BP:  132/62 (!) 124/51 (!) 118/57  Pulse:  81    Resp:  (!) 21    Temp:  97.6 F (36.4 C) 97.6 F (36.4 C) (!) 97.4 F (36.3 C)  TempSrc:  Oral Oral Oral  SpO2:  100% 95% 94%  Weight: 69.8 kg     Height: 5\' 6"  (1.676 m)       Intake/Output Summary (Last 24 hours) at 01/21/2019 1551 Last data filed at 01/21/2019 1400 Gross per 24 hour  Intake 240 ml  Output 300 ml  Net -60 ml   Filed Weights   01/21/19 0556  Weight: 69.8 kg    Examination:  General exam: Appears calm and comfortable  Respiratory system: no audible wheezing. Respiratory effort normal. Cardiovascular system: perfused, regular rate Gastrointestinal system: nondistedned, obese Central nervous system: Alert and oriented. No focal neurological deficits. Extremities: Symmetric 5 x 5 power. Skin: No rashes, lesions Psychiatry: Judgement and insight appear normal. Mood & affect appropriate.   Data Reviewed: I have personally reviewed following labs and imaging studies  CBC: Recent Labs  Lab 01/17/19 1144 01/29/2019 1847 01/21/19 0027 01/21/19 0447  WBC 5.3 6.2 7.1 8.2  NEUTROABS  --  5.5  --  7.3  HGB 10.4* 10.3* 10.6* 10.8*  HCT 30.1* 31.1* 32.8* 33.2*  MCV 99* 104.7* 107.5* 106.4*  PLT 122* 148* 155 564   Basic Metabolic Panel: Recent Labs  Lab 01/17/19 1144 01/19/2019 1847 01/21/19 0027 01/21/19 0447  NA 140 140  --  141  K 3.2* 3.2*  --  3.2*  CL 97 101  --  102  CO2 24 24  --  25  GLUCOSE 100* 111*  --  107*  BUN 56* 64*  --  62*  CREATININE 2.76* 3.01* 2.78* 2.85*  CALCIUM 8.5* 8.3*  --  8.4*  MG  --   --   --  1.6*  PHOS  --   --   --  3.5   GFR: Estimated Creatinine Clearance: 15.5 mL/min (A) (by C-G formula based on SCr of 2.85 mg/dL (H)). Liver Function Tests: Recent Labs  Lab 01/31/2019 1847 01/21/19 0447  AST 45* 39  ALT 37 34  ALKPHOS 123 124  BILITOT 2.0* 2.7*  PROT 6.1* 6.1*  ALBUMIN 2.7*  2.6*   No results for input(s): LIPASE, AMYLASE in the last 168 hours. No results for input(s): AMMONIA in the last 168 hours. Coagulation Profile: No results for input(s): INR, PROTIME in the last 168 hours. Cardiac Enzymes: No results for input(s): CKTOTAL, CKMB, CKMBINDEX, TROPONINI in the last 168 hours. BNP (last 3 results) Recent Labs    09/03/18 1400 09/25/18 1431 10/30/18 1605  PROBNP 10,929* 10,644* 12,982*   HbA1C: No results for input(s): HGBA1C in the last 72 hours. CBG: No results for input(s): GLUCAP in the last 168 hours. Lipid Profile: No results for input(s): CHOL, HDL, LDLCALC, TRIG, CHOLHDL, LDLDIRECT in the last 72 hours. Thyroid Function Tests: Recent Labs  01/21/19 0027  TSH 6.836*   Anemia Panel: Recent Labs    01/21/19 0447  FERRITIN 517*   Sepsis Labs: No results for input(s): PROCALCITON, LATICACIDVEN in the last 168 hours.  Recent Results (from the past 240 hour(s))  SARS CORONAVIRUS 2 (TAT 6-24 HRS) Nasopharyngeal Nasopharyngeal Swab     Status: Abnormal   Collection Time: 01/31/2019  7:18 PM   Specimen: Nasopharyngeal Swab  Result Value Ref Range Status   SARS Coronavirus 2 POSITIVE (A) NEGATIVE Final    Comment: RESULT CALLED TO, READ BACK BY AND VERIFIED WITH: JAMES RAMOS RN.@[email or web address]  ON 11.10.2020 BY TCALDWELL MT. (NOTE) SARS-CoV-2 target nucleic acids are DETECTED. The SARS-CoV-2 RNA is generally detectable in upper and lower respiratory specimens during the acute phase of infection. Positive results are indicative of active infection with SARS-CoV-2. Clinical  correlation with patient history and other diagnostic information is necessary to determine patient infection status. Positive results do  not rule out bacterial infection or co-infection with other viruses. The expected result is Negative. Fact Sheet for Patients: SugarRoll.be Fact Sheet for Healthcare Providers:  https://www.woods-mathews.com/ This test is not yet approved or cleared by the [place] FDA and  has been authorized for detection and/or diagnosis of SARS-CoV-2 by FDA under an Emergency Use Authorization (EUA). This EUA will remain  in effect (meaning this test ca n be used) for the duration of the COVID-19 declaration under Section 564(b)(1) of the Act, 21 U.S.C. section 360bbb-3(b)(1), unless the authorization is terminated or revoked sooner. Performed at [facility], [place] [address], [place], [place] [address]      Radiology Studies: Dg Chest Port 1 View  Result Date: 01/22/2019 CLINICAL DATA:  Shortness of breath EXAM: PORTABLE CHEST 1 VIEW COMPARISON:  12/19/2017 FINDINGS: Post sternotomy changes. Apices are obscured by the patient's neck and chin. Mild cardiomegaly. Interim development of interstitial and peripheral ground-glass opacity and consolidations. No pneumothorax. IMPRESSION: Interim development of diffuse bilateral interstitial and ground-glass opacities and patchy consolidations, some of which are peripheral in distribution. Findings suspect for multifocal infection, particularly atypical or viral pneumonia. Electronically Signed   By: Donavan Foil M.D.   On: 01/31/2019 19:10    Scheduled Meds: . allopurinol  100 mg Oral QHS  . aspirin EC  81 mg Oral Daily  . atorvastatin  40 mg Oral q1800  . carvedilol  3.125 mg Oral BID  . dexamethasone  6 mg Oral Daily  . furosemide  20 mg Intravenous BID  . heparin  5,000 Units Subcutaneous Q8H  . megestrol  400 mg Oral Daily  . multivitamin with minerals  1 tablet Oral Daily  . potassium chloride  10 mEq Oral Daily  . sodium chloride flush  3 mL Intravenous Q12H  . sodium chloride flush  3 mL Intravenous Q12H  . cyanocobalamin  100 mcg Oral Daily  . vitamin C  500 mg Oral Daily  . zinc sulfate  220 mg Oral Daily   Continuous Infusions: . sodium chloride    . [START ON 01/22/2019] remdesivir 100 mg  in NS 250 mL       LOS: 1 day   Marylu Lund, MD [facility] Pager On Amion  If 7PM-7AM, please contact night-coverage 01/21/2019, 3:51 PM

## 2019-01-21 NOTE — Progress Notes (Signed)
CRITICAL VALUE ALERT  Critical Value:  Troponin level 120  Date & Time Notied:  01/21/19  1258  Provider Notified: 01/21/19 1300  Orders Received/Actions taken:  No new orders from Dr Wyline Copas spoke on phone at 1302

## 2019-01-21 NOTE — Telephone Encounter (Signed)
Dr. Gardiner Rhyme called and wanted to let the office know that the patient has tested positive for COVID. He was seen by Dr. Tamala Julian in the office on 11/06

## 2019-01-22 ENCOUNTER — Inpatient Hospital Stay (HOSPITAL_COMMUNITY): Payer: Medicare Other

## 2019-01-22 DIAGNOSIS — I5043 Acute on chronic combined systolic (congestive) and diastolic (congestive) heart failure: Secondary | ICD-10-CM

## 2019-01-22 DIAGNOSIS — E7849 Other hyperlipidemia: Secondary | ICD-10-CM

## 2019-01-22 DIAGNOSIS — J9601 Acute respiratory failure with hypoxia: Secondary | ICD-10-CM

## 2019-01-22 DIAGNOSIS — E876 Hypokalemia: Secondary | ICD-10-CM

## 2019-01-22 LAB — COMPREHENSIVE METABOLIC PANEL
ALT: 30 U/L (ref 0–44)
AST: 33 U/L (ref 15–41)
Albumin: 2.3 g/dL — ABNORMAL LOW (ref 3.5–5.0)
Alkaline Phosphatase: 116 U/L (ref 38–126)
Anion gap: 12 (ref 5–15)
BUN: 75 mg/dL — ABNORMAL HIGH (ref 8–23)
CO2: 26 mmol/L (ref 22–32)
Calcium: 8.5 mg/dL — ABNORMAL LOW (ref 8.9–10.3)
Chloride: 104 mmol/L (ref 98–111)
Creatinine, Ser: 2.94 mg/dL — ABNORMAL HIGH (ref 0.61–1.24)
GFR calc Af Amer: 21 mL/min — ABNORMAL LOW (ref >60)
GFR calc non Af Amer: 18 mL/min — ABNORMAL LOW (ref >60)
Glucose, Bld: 166 mg/dL — ABNORMAL HIGH (ref 70–99)
Potassium: 3.5 mmol/L (ref 3.5–5.1)
Sodium: 142 mmol/L (ref 135–145)
Total Bilirubin: 1.9 mg/dL — ABNORMAL HIGH (ref 0.3–1.2)
Total Protein: 5.4 g/dL — ABNORMAL LOW (ref 6.5–8.1)

## 2019-01-22 LAB — CBC WITH DIFFERENTIAL/PLATELET
Abs Immature Granulocytes: 0.03 10*3/uL (ref 0.00–0.07)
Basophils Absolute: 0 10*3/uL (ref 0.0–0.1)
Basophils Relative: 0 %
Eosinophils Absolute: 0 10*3/uL (ref 0.0–0.5)
Eosinophils Relative: 0 %
HCT: 30.1 % — ABNORMAL LOW (ref 39.0–52.0)
Hemoglobin: 9.9 g/dL — ABNORMAL LOW (ref 13.0–17.0)
Immature Granulocytes: 0 %
Lymphocytes Relative: 7 %
Lymphs Abs: 0.5 10*3/uL — ABNORMAL LOW (ref 0.7–4.0)
MCH: 34.7 pg — ABNORMAL HIGH (ref 26.0–34.0)
MCHC: 32.9 g/dL (ref 30.0–36.0)
MCV: 105.6 fL — ABNORMAL HIGH (ref 80.0–100.0)
Monocytes Absolute: 0.2 10*3/uL (ref 0.1–1.0)
Monocytes Relative: 3 %
Neutro Abs: 6.7 10*3/uL (ref 1.7–7.7)
Neutrophils Relative %: 90 %
Platelets: 170 10*3/uL (ref 150–400)
RBC: 2.85 MIL/uL — ABNORMAL LOW (ref 4.22–5.81)
RDW: 15.6 % — ABNORMAL HIGH (ref 11.5–15.5)
WBC: 7.4 10*3/uL (ref 4.0–10.5)
nRBC: 0.3 % — ABNORMAL HIGH (ref 0.0–0.2)

## 2019-01-22 LAB — D-DIMER, QUANTITATIVE: D-Dimer, Quant: 1.48 ug/mL-FEU — ABNORMAL HIGH (ref 0.00–0.50)

## 2019-01-22 LAB — C-REACTIVE PROTEIN: CRP: 8.1 mg/dL — ABNORMAL HIGH (ref <1.0)

## 2019-01-22 LAB — PHOSPHORUS: Phosphorus: 3 mg/dL (ref 2.5–4.6)

## 2019-01-22 LAB — FERRITIN: Ferritin: 429 ng/mL — ABNORMAL HIGH (ref 24–336)

## 2019-01-22 LAB — MAGNESIUM: Magnesium: 1.8 mg/dL (ref 1.7–2.4)

## 2019-01-22 MED ORDER — FUROSEMIDE 10 MG/ML IJ SOLN
80.0000 mg | Freq: Two times a day (BID) | INTRAMUSCULAR | Status: DC
  Administered 2019-01-22 – 2019-01-23 (×3): 80 mg via INTRAVENOUS
  Filled 2019-01-22 (×4): qty 8

## 2019-01-22 NOTE — Evaluation (Signed)
Occupational Therapy Evaluation Patient Details Name: Danny [name] MRN: [id number] DOB: 07/13/1926 Today's Date: 01/22/2019    History of Present Illness 85 y.o. male with medical history significant of systolic dysfunction CHF with EF of 15%, history of CVA, chronic kidney disease stage III, coronary artery disease, hyperlipidemia, hypertension, peripheral vascular disease who presents to the ER with progressive shortness of breath cough fatigue and chest pain. Admitted 02/06/2019 with acute on chronic CHF exacerbation with worsening renal function, inpresence of COVID+   Clinical Impression   PTA, pt was living alone and was independent; pt's daughter visiting at least once a week, assisting with IADLs, and has been checking in more often with recent decline. Pt currently performing ADLs and functional mobility at [place] with RW. Upon arrival, pt on 8L O2 via HFNC with an extension and SpO2 maintaining at 94-93% with O2 monitor on finger. During activity, SpO2 maintaining >96% on 4L O2 via HFNC with O2 monitor on earlobe; see general comments. Pt would benefit from further acute OT to facilitate safe dc. Recommend dc to home with 24/7 support and HHOT for further OT to optimize safety, independence with ADLs, and return to PLOF.   Discussed possible dc options with daughter over the phone as well as confirming home support. She reports she would like to get palliative involved for increased support in preparation for dc to home.     Follow Up Recommendations  Home health OT;Supervision/Assistance - 24 hour    Equipment Recommendations  3 in 1 bedside commode    Recommendations for Other Services PT consult;Other (comment)(Palliative)     Precautions / Restrictions Precautions Precautions: Fall;Other (comment)(watch SpO2)      Mobility Bed Mobility Overal bed mobility: Needs Assistance Bed Mobility: Supine to Sit     Supine to sit: Min guard;HOB elevated     General bed  mobility comments: Increased time and effort  Transfers Overall transfer level: Needs assistance Equipment used: None Transfers: Sit to/from Stand Sit to Stand: Min guard         General transfer comment: Min Guard A for safety    Balance Overall balance assessment: Needs assistance Sitting-balance support: No upper extremity supported;Feet supported Sitting balance-Leahy Scale: Good     Standing balance support: No upper extremity supported;During functional activity Standing balance-Leahy Scale: Fair Standing balance comment: Able to maintain standing at sink for oral care without assist or UE support                           ADL either performed or assessed with clinical judgement   ADL Overall ADL's : Needs assistance/impaired Eating/Feeding: Set up;Sitting   Grooming: Oral care;Min guard;Standing Grooming Details (indicate cue type and reason): Pt performing oral care at sink with Min guard A for safety. Pt tolerating standing at sink for ~7 minutes without fatigue. SpO2 >96% on 4L O2 via HFNC with O2 monitor on his ear.  Upper Body Bathing: Min guard;Sitting   Lower Body Bathing: Min guard;Sit to/from stand   Upper Body Dressing : Min guard;Sitting   Lower Body Dressing: Min guard;Sit to/from stand   Toilet Transfer: Min guard;Ambulation;RW(simulated to recliner)           Functional mobility during ADLs: Min guard;Rolling walker General ADL Comments: Pt performing ADLs with Min Guard A for safety. Pt very motivated to participate in therapy     Vision Baseline Vision/History: Wears glasses Wears Glasses: Reading only Patient Visual  Report: No change from baseline       Perception     Praxis      Pertinent Vitals/Pain Pain Assessment: No/denies pain     Hand Dominance     Extremity/Trunk Assessment Upper Extremity Assessment Upper Extremity Assessment: Generalized weakness   Lower Extremity Assessment Lower Extremity Assessment:  Defer to PT evaluation   Cervical / Trunk Assessment Cervical / Trunk Assessment: Normal   Communication Communication Communication: No difficulties   Cognition Arousal/Alertness: Awake/alert Behavior During Therapy: WFL for tasks assessed/performed Overall Cognitive Status: Within Functional Limits for tasks assessed                                 General Comments: Very motivated and sweet. Age appropiate cognition   General Comments  SpO2 94-93% on 8L in supine with HFNC on one extension. Took out extension and placed on direct 8L O2 via HFNC. Pt maintaining SpO2 94-93%. Lowered O2 to 6L via HFNC and pt maintained SpO2 94-91% during mobility and oral care. Switched O2 monitor from finger to ear; SpO2 at 98-96% on 4L O2 via HFNC throughout session. Notified RN.     Exercises     Shoulder Instructions      Home Living Family/patient expects to be discharged to:: Private residence Living Arrangements: Alone Available Help at Discharge: Family Type of Home: House Home Access: Stairs to enter;Ramped entrance Entrance Stairs-Number of Steps: 5 Entrance Stairs-Rails: Right;Left Home Layout: Two level;Able to live on main level with bedroom/bathroom     Bathroom Shower/Tub: Teacher, early years/pre: Standard     Home Equipment: Shower seat;Grab bars - toilet          Prior Functioning/Environment Level of Independence: Independent                 OT Problem List: Decreased strength;Decreased range of motion;Decreased activity tolerance;Impaired balance (sitting and/or standing);Decreased knowledge of use of DME or AE;Decreased knowledge of precautions      OT Treatment/Interventions: Self-care/ADL training;Therapeutic exercise;Energy conservation;DME and/or AE instruction;Therapeutic activities;Patient/family education    OT Goals(Current goals can be found in the care plan section) Acute Rehab OT Goals Patient Stated Goal: "go home" OT  Goal Formulation: With patient Time For Goal Achievement: 02/05/19 Potential to Achieve Goals: Good  OT Frequency: Min 3X/week   Barriers to D/C:            Co-evaluation PT/OT/SLP Co-Evaluation/Treatment: Yes Reason for Co-Treatment: Complexity of the patient's impairments (multi-system involvement);For patient/therapist safety;To address functional/ADL transfers   OT goals addressed during session: ADL's and self-care      AM-PAC OT "6 Clicks" Daily Activity     Outcome Measure Help from another person eating meals?: None Help from another person taking care of personal grooming?: A Little Help from another person toileting, which includes using toliet, bedpan, or urinal?: A Little Help from another person bathing (including washing, rinsing, drying)?: A Little Help from another person to put on and taking off regular upper body clothing?: A Little Help from another person to put on and taking off regular lower body clothing?: A Little 6 Click Score: 19   End of Session Equipment Utilized During Treatment: Oxygen(8-4L) Nurse Communication: Mobility status  Activity Tolerance: Patient tolerated treatment well Patient left: in chair;with call bell/phone within reach;with chair alarm set;with nursing/sitter in room  OT Visit Diagnosis: Unsteadiness on feet (R26.81);Other abnormalities of gait and mobility (R26.89);Muscle weakness (  generalized) (M62.81)                Time: 9507-2257 OT Time Calculation (min): 39 min Charges:  OT General Charges $OT Visit: 1 Visit OT Evaluation $OT Eval Moderate Complexity: 1 Mod OT Treatments $Self Care/Home Management : 8-22 mins  Kabrea Seeney MSOT, OTR/L Acute Rehab Pager: [phone] Office: [place] 01/22/2019, 4:50 PM

## 2019-01-22 NOTE — Plan of Care (Signed)

## 2019-01-22 NOTE — Evaluation (Signed)
Physical Therapy Evaluation Patient Details Name: Danny [name] MRN: [id number] DOB: 1926/06/30 Today's Date: 01/22/2019   History of Present Illness  85 y.o. male with medical history significant of systolic dysfunction CHF with EF of 15%, history of CVA, chronic kidney disease stage III, coronary artery disease, hyperlipidemia, hypertension, peripheral vascular disease who presents to the ER with progressive shortness of breath cough fatigue and chest pain. Admitted 01/18/2019 with acute on chronic CHF exacerbation with worsening renal function, inpresence of COVID+  Clinical Impression  PTA pt living alone in single story home with 5 steps to enter. Pt's daughter has been providing more assistance in last couple months. Pt was independent in limited community ambulation without AD and was independent in ADLs, daughter assisted with some iADLs. Pt is currently limited in safe mobility by novel oxygen requirement in presence of generalized weakness and decreased dynamic balance. Pt requires min guard for bed mobility and transfers and for majority of ambulation however did have 1x LoB requiring min A for recovery. Given pt's motivation and desire to return home PT recommends HHPT if daughter can arrange for 24 hour care for safety. PT will continue to follow acutely.     Follow Up Recommendations Home health PT;Supervision/Assistance - 24 hour    Equipment Recommendations  Rolling walker with 5" wheels       Precautions / Restrictions Precautions Precautions: Fall;Other (comment)(watch SpO2) Restrictions Weight Bearing Restrictions: No      Mobility  Bed Mobility Overal bed mobility: Needs Assistance Bed Mobility: Supine to Sit     Supine to sit: Min guard;HOB elevated     General bed mobility comments: Increased time and effort  Transfers Overall transfer level: Needs assistance Equipment used: None Transfers: Sit to/from Stand Sit to Stand: Min guard         General  transfer comment: Min Guard A for safety  Ambulation/Gait Ambulation/Gait assistance: Min assist;Min guard Gait Distance (Feet): 20 Feet Assistive device: Rolling walker (2 wheeled) Gait Pattern/deviations: Step-through pattern;Decreased step length - right;Decreased step length - left;Shuffle Gait velocity: slowed Gait velocity interpretation: <1.8 ft/sec, indicate of risk for recurrent falls General Gait Details: min guard for safety with 1x minA for LOB, vc for proximity to RW,         Balance Overall balance assessment: Needs assistance Sitting-balance support: No upper extremity supported;Feet supported Sitting balance-Leahy Scale: Good     Standing balance support: No upper extremity supported;During functional activity Standing balance-Leahy Scale: Fair Standing balance comment: Able to maintain standing at sink for oral care without assist or UE support                             Pertinent Vitals/Pain Pain Assessment: No/denies pain    Home Living Family/patient expects to be discharged to:: Private residence Living Arrangements: Alone Available Help at Discharge: Family Type of Home: House Home Access: Stairs to enter;Ramped entrance Entrance Stairs-Rails: Right;Left Entrance Stairs-Number of Steps: 5 Home Layout: Two level;Able to live on main level with bedroom/bathroom Home Equipment: Shower seat;Grab bars - toilet      Prior Function Level of Independence: Independent                  Extremity/Trunk Assessment   Upper Extremity Assessment Upper Extremity Assessment: Defer to OT evaluation    Lower Extremity Assessment Lower Extremity Assessment: Generalized weakness    Cervical / Trunk Assessment Cervical / Trunk Assessment: Normal  Communication  Communication: No difficulties  Cognition Arousal/Alertness: Awake/alert Behavior During Therapy: WFL for tasks assessed/performed Overall Cognitive Status: Within Functional  Limits for tasks assessed                                 General Comments: Very motivated and sweet. Age appropiate cognition      General Comments General comments (skin integrity, edema, etc.): SpO2 94-93% on 8L in supine with HFNC on one extension. Took out extension and placed on direct 8L O2 via HFNC. Pt maintaining SpO2 94-93%. Lowered O2 to 6L via HFNC and pt maintained SpO2 94-91% during mobility and oral care. Switched O2 monitor from finger to ear; SpO2 at 98-96% on 4L O2 via HFNC throughout session. Notified RN.      Assessment/Plan    PT Assessment Patient needs continued PT services  PT Problem List Decreased strength;Decreased balance;Decreased mobility;Cardiopulmonary status limiting activity       PT Treatment Interventions DME instruction;Gait training;Functional mobility training;Therapeutic activities;Therapeutic exercise;Balance training;Patient/family education;Stair training    PT Goals (Current goals can be found in the Care Plan section)  Acute Rehab PT Goals Patient Stated Goal: "go home" PT Goal Formulation: With patient Time For Goal Achievement: 02/05/19 Potential to Achieve Goals: Good    Frequency Min 3X/week   Barriers to discharge Decreased caregiver support daughter looking for [facility] to assit overnight    Co-evaluation PT/OT/SLP Co-Evaluation/Treatment: Yes Reason for Co-Treatment: Complexity of the patient's impairments (multi-system involvement) PT goals addressed during session: Mobility/safety with mobility OT goals addressed during session: ADL's and self-care       AM-PAC PT "6 Clicks" Mobility  Outcome Measure Help needed turning from your back to your side while in a flat bed without using bedrails?: A Little Help needed moving from lying on your back to sitting on the side of a flat bed without using bedrails?: A Little Help needed moving to and from a bed to a chair (including a wheelchair)?: A Little Help needed  standing up from a chair using your arms (e.g., wheelchair or bedside chair)?: A Little Help needed to walk in hospital room?: A Little Help needed climbing 3-5 steps with a railing? : A Lot 6 Click Score: 17    End of Session Equipment Utilized During Treatment: Oxygen Activity Tolerance: Patient tolerated treatment well Patient left: in chair;with call bell/phone within reach;with chair alarm set;with nursing/sitter in room Nurse Communication: Mobility status PT Visit Diagnosis: Unsteadiness on feet (R26.81);Other abnormalities of gait and mobility (R26.89);Muscle weakness (generalized) (M62.81)    Time: 6789-3810 PT Time Calculation (min) (ACUTE ONLY): 39 min   Charges:   PT Evaluation $PT Eval Moderate Complexity: 1 Mod          Emelynn Rance B. Migdalia Dk PT, DPT Acute Rehabilitation Services Pager [phone] Office [phone]   [place] 01/22/2019, 5:39 PM

## 2019-01-22 NOTE — Progress Notes (Signed)
[facility]  PROGRESS NOTE  Danny [name] FBP:[id number] DOB: 03/23/1926 DOA: 01/29/2019 PCP: Lavone Orn, MD Admit date - 02/06/2019   Admitting Physician Elwyn Reach, MD  Outpatient Primary MD for the patient is Lavone Orn, MD  LOS - 2 Brief Narrative   Danny [name] is a 85 y.o. year old male with medical history significant for systolic dysfunction CHF with EF of 15%, history of CVA, chronic kidney disease stage III, coronary artery disease, hyperlipidemia, hypertension, peripheral vascular disease who presented on 01/29/2019 with progressive shortness of breath, cough, fatigue and chest pain and was found to have acute hypoxic respiratory failure of multifactorial etiology including COVID-19 pneumonia and acute on chronic CHF exacerbation with worsening renal function.  Hospital course complicated by persistently high oxygen requirements related to multifocal Covid infection and acute on chronic CHF exacerbation  Subjective  FNAME@ [facility] today has persistent shortness of breath.  No chest pain.  Minimal cough.  Tolerating diet. A & P    1. Acute hypoxic respiratory failure, multifactorial-including Covid pneumonia and CHF exacerbation, worsening now requiring 8 L nasal cannula. Continue COVID treatment. Increasing IV lasix diuresis and cardiology assisting.   2. Covid pneumonia, repeat CXR shows slight interval progression of airspace opacities.  Continue to monitor serial inflammatory markers, continue Decadron, remdesivir, vitamin C, zinc.  Hold off on [facility] transfer given cardiology assistance  3. Acute on chronic CHF with reduced ejection fraction exacerbation, worsening.  JVD noted, peripheral edema present, BNP > 3000, persistent O2 requirements, need to increase IV Lasix to 80 mg twice daily, cardiology assisting given advanced heart failure and poor tolerance of BP with diuresis as well as CKD, closely monitor kidney function  4. AKI on CKD stage IV, likely prerenal  in setting of CHF extubation, hopeful improvement with continued diuresis, avoid nephrotoxins, monitor BMP  5. Hypokalemia, in the setting of active diuresis, replete, repeat BMP in a.m., check mag as well  6. Macrocytic anemia, stable.  Hemoglobin stable at around 10, no signs of bleeding, continue home vitamin B12  7. CAD status post CABG (2002).  Asymptomatic.  Troponin trend flat neck not consistent with ACS, most likely related to Covid  8. HTN, currently normotensive.  Continue carvedilol  9. Gout, stable continue allopurinol  10. HLD, stable continue Lipitor     Family Communication  :  Spoke with [name] at [phone] and gave an update  Code Status :  DNR  Disposition Plan  :  Continue as inpatient needing continued IV diuresis for CHF exacerbation and remesdevir for COVID and persistent oxygen requirement  Consults  :  Cardiology  Procedures  :  none  DVT Prophylaxis  :  Heparin   Lab Results  Component Value Date   PLT 170 01/22/2019    Diet :  Diet Order            Diet Heart Room service appropriate? Yes; Fluid consistency: Thin  Diet effective now               Inpatient Medications Scheduled Meds: . allopurinol  100 mg Oral QHS  . aspirin EC  81 mg Oral Daily  . atorvastatin  40 mg Oral q1800  . carvedilol  3.125 mg Oral BID  . dexamethasone  6 mg Oral Daily  . docusate sodium  100 mg Oral Daily  . furosemide  80 mg Intravenous BID  . heparin  5,000 Units Subcutaneous Q8H  . megestrol  400 mg Oral Daily  .  multivitamin with minerals  1 tablet Oral Daily  . potassium chloride  10 mEq Oral Daily  . sodium chloride flush  3 mL Intravenous Q12H  . sodium chloride flush  3 mL Intravenous Q12H  . cyanocobalamin  100 mcg Oral Daily  . vitamin C  500 mg Oral Daily  . zinc sulfate  220 mg Oral Daily   Continuous Infusions: . sodium chloride    . remdesivir 100 mg in NS 250 mL 100 mg (01/22/19 1020)   PRN Meds:.sodium chloride, acetaminophen,  albuterol, chlorpheniramine-HYDROcodone, guaiFENesin-dextromethorphan, lactulose, nitroGLYCERIN, ondansetron (ZOFRAN) IV, ondansetron, sodium chloride flush  Antibiotics  :   Anti-infectives (From admission, onward)   Start     Dose/Rate Route Frequency Ordered Stop   01/22/19 1000  remdesivir 100 mg in sodium chloride 0.9 % 250 mL IVPB     100 mg 500 mL/hr over 30 Minutes Intravenous Every 24 hours 01/21/19 0330 01/26/19 0959   01/21/19 0530  remdesivir 200 mg in sodium chloride 0.9 % 250 mL IVPB     200 mg 500 mL/hr over 30 Minutes Intravenous Once 01/21/19 0330 01/21/19 1900       Objective   Vitals:   01/22/19 1200 01/22/19 1440 01/22/19 1610 01/22/19 1640  BP:  113/64  121/69  Pulse:      Resp:   20 17  Temp:  97.6 F (36.4 C)    TempSrc:  Oral    SpO2: 95% 95% 99% 100%  Weight:      Height:        SpO2: 100 % O2 Flow Rate (L/min): 4 L/min  Wt Readings from Last 3 Encounters:  01/21/19 69.8 kg  01/17/19 72.8 kg  12/05/18 73.9 kg     Intake/Output Summary (Last 24 hours) at 01/22/2019 1838 Last data filed at 01/22/2019 1640 Gross per 24 hour  Intake 610 ml  Output 500 ml  Net 110 ml    Physical Exam:  Awake Alert, Oriented X 3, Normal affect No new F.N deficits,  [place].AT, JVD noted Symmetrical Chest wall movement, diffuse rhonchi, crackles at bases, no respiratory distress on 8 LNC, 95% SpO2 RRR,No Gallops,Rubs or new Murmurs,  +ve B.Sounds, Abd Soft, No tenderness,  No rebound, guarding or rigidity. No Cyanosis,    I have personally reviewed the following:   Data Reviewed:  CBC Recent Labs  Lab 01/17/19 1144 01/15/2019 1847 01/21/19 0027 01/21/19 0447 01/22/19 0322  WBC 5.3 6.2 7.1 8.2 7.4  HGB 10.4* 10.3* 10.6* 10.8* 9.9*  HCT 30.1* 31.1* 32.8* 33.2* 30.1*  PLT 122* 148* 155 163 170  MCV 99* 104.7* 107.5* 106.4* 105.6*  MCH 34.1* 34.7* 34.8* 34.6* 34.7*  MCHC 34.6 33.1 32.3 32.5 32.9  RDW 13.4 15.5 15.7* 15.8* 15.6*  LYMPHSABS  --   0.4*  --  0.5* 0.5*  MONOABS  --  0.3  --  0.3 0.2  EOSABS  --  0.0  --  0.0 0.0  BASOSABS  --  0.0  --  0.0 0.0    Chemistries  Recent Labs  Lab 01/17/19 1144 01/24/2019 1847 01/21/19 0027 01/21/19 0447 01/22/19 0322  NA 140 140  --  141 142  K 3.2* 3.2*  --  3.2* 3.5  CL 97 101  --  102 104  CO2 24 24  --  25 26  GLUCOSE 100* 111*  --  107* 166*  BUN 56* 64*  --  62* 75*  CREATININE 2.76* 3.01* 2.78* 2.85* 2.94*  CALCIUM  8.5* 8.3*  --  8.4* 8.5*  MG  --   --   --  1.6* 1.8  AST  --  45*  --  39 33  ALT  --  37  --  34 30  ALKPHOS  --  123  --  124 116  BILITOT  --  2.0*  --  2.7* 1.9*   ------------------------------------------------------------------------------------------------------------------ No results for input(s): CHOL, HDL, LDLCALC, TRIG, CHOLHDL, LDLDIRECT in the last 72 hours.  No results found for: HGBA1C ------------------------------------------------------------------------------------------------------------------ Recent Labs    01/21/19 0027  TSH 6.836*   ------------------------------------------------------------------------------------------------------------------ Recent Labs    01/21/19 0447 01/22/19 0322  FERRITIN 517* 429*    Coagulation profile No results for input(s): INR, PROTIME in the last 168 hours.  Recent Labs    01/21/19 0447 01/22/19 0322  DDIMER 1.78* 1.48*    Cardiac Enzymes No results for input(s): CKMB, TROPONINI, MYOGLOBIN in the last 168 hours.  Invalid input(s): CK ------------------------------------------------------------------------------------------------------------------    Component Value Date/Time   BNP 3,833.7 (H) 01/14/2019 1837    Micro Results Recent Results (from the past 240 hour(s))  SARS CORONAVIRUS 2 (TAT 6-24 HRS) Nasopharyngeal Nasopharyngeal Swab     Status: Abnormal   Collection Time: 01/26/2019  7:18 PM   Specimen: Nasopharyngeal Swab  Result Value Ref Range Status   SARS  Coronavirus 2 POSITIVE (A) NEGATIVE Final    Comment: RESULT CALLED TO, READ BACK BY AND VERIFIED WITH: JAMES RAMOS [email or web address].@[email or web address]  ON 11.10.2020 BY TCALDWELL MT. (NOTE) SARS-CoV-2 target nucleic acids are DETECTED. The SARS-CoV-2 RNA is generally detectable in upper and lower respiratory specimens during the acute phase of infection. Positive results are indicative of active infection with SARS-CoV-2. Clinical  correlation with patient history and other diagnostic information is necessary to determine patient infection status. Positive results do  not rule out bacterial infection or co-infection with other viruses. The expected result is Negative. Fact Sheet for Patients: SugarRoll.be Fact Sheet for Healthcare Providers: https://www.woods-mathews.com/ This test is not yet approved or cleared by the [place] FDA and  has been authorized for detection and/or diagnosis of SARS-CoV-2 by FDA under an Emergency Use Authorization (EUA). This EUA will remain  in effect (meaning this test ca n be used) for the duration of the COVID-19 declaration under Section 564(b)(1) of the Act, 21 U.S.C. section 360bbb-3(b)(1), unless the authorization is terminated or revoked sooner. Performed at [facility], [place] [address], [place], [place] [address]     Radiology Reports Dg Chest [place] 1 View  Result Date: 01/22/2019 CLINICAL DATA:  Shortness of breath, COVID-19 positive EXAM: PORTABLE CHEST 1 VIEW COMPARISON:  01/21/2019 FINDINGS: Postsurgical changes again noted to the sternum and mediastinum. Mild cardiomegaly. Calcific aortic knob. Patchy bilateral airspace opacities with slight interval progression when compared to the prior study. No pneumothorax. IMPRESSION: Patchy bilateral airspace opacities with slight interval progression when compared to the prior study. Electronically Signed   By: Davina Poke M.D.   On: 01/22/2019 11:49   Dg Chest Port 1  View  Result Date: 01/19/2019 CLINICAL DATA:  Shortness of breath EXAM: PORTABLE CHEST 1 VIEW COMPARISON:  12/19/2017 FINDINGS: Post sternotomy changes. Apices are obscured by the patient's neck and chin. Mild cardiomegaly. Interim development of interstitial and peripheral ground-glass opacity and consolidations. No pneumothorax. IMPRESSION: Interim development of diffuse bilateral interstitial and ground-glass opacities and patchy consolidations, some of which are peripheral in distribution. Findings suspect for multifocal infection, particularly atypical or viral pneumonia. Electronically Signed  By: Donavan Foil M.D.   On: 01/28/2019 19:10     Time Spent in minutes  30     Desiree Hane M.D on 01/22/2019 at 6:38 PM  To page go to [email or web address] - password [email or web address]

## 2019-01-22 NOTE — Consult Note (Addendum)
Cardiology Consultation:   Patient ID: Danny [name] MRN: [id number]; DOB: 22-Oct-1926  Admit date: 01/26/2019 Date of Consult: 01/22/2019  Primary Care Provider: Lavone Orn, MD Primary Cardiologist: Sinclair Grooms, MD  Primary Electrophysiologist:  None    Patient Profile:   Danny [name] is a 85 y.o. 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 Teryl Lucy.  History of Present Illness:   Patinet follows with Dr. Tamala Julian for the above cardiac issues. He has a histroy of CAD s/p CABG in 2002 ( LIMA to LAD, SVG to ramus, SVG to obtuse margin, and SVG to PDA), hypertension, hyperlipidemia, PAD with bilateral iliac stent 2016 by Dr. Fletcher Anon, and carotid artery disease s/p bilateral CEA. In the past patient was being treated for diastolic heart dailure but recent echo 11/06/18 showed EF 15-20% which was decreased from echo 12/2017 with EF 60-65%. RV systolic pressures was moderately elevated with estimated pressure of 43 mmHg. Patient was following with Dr. Tamala Julian and labs also showed worsening creatinine. Pro BNP was elevated to 12,982. Metorpolol was switched to carvdilol. ACE was stopped for renal function. Bidil was not tolerated due to hypotension.  Dr. Tamala Julian thought patient was not a good candidate for advanced HF therapies due to age and discussed option of DNR/palliative care. Daughter was reluctant. At follow up Lasix was switched to torsemide. Patient later reported 8-9 lb weight loss.   His last visit was 01/17/19 with Dr. Tamala Julian and torsemide was increased to 40 mg BID. No report of chest pain.   Danny [name] presented to the ED 02/09/2019 with progressive shortness of breath, and cough for last couple days. Shortness of breath was at rest as well as on exertion. He was to the point he felt he couldn't breath. He felt weak as well and  had loss of appetite. He reported orthopnea for the last 2-3 days. He did have lower leg edema but noted it had been improving with home dose of Toresemide. Reported a 10 lbs weight loss since staring Torsemide. Denied chest pain, nauseua, vomiting, or diaphoresis. Denied fever or sick contacts.   In the ED BP was 142/82, pulse 81, afebrile. He required 2L O2 [place]. Labs showed potassium 3.2, chloride 101, CO2 24, creatinine 3.73m BUN 64, glucose 111. Cbc showed WBC 6.2, Hgb 10.3, platelets 148, albumin 2.7. CXR showed diffuse bilateral interstitial ground glass opacity and patchy consolidation consistent with multifocal infection. BNP was elevated to 3,833. Hs troponin 120> 134. EKG showed NSR, 80 bpm, with nonspecific T wave changes. Patient was started on IV lasix 20 mg BID. COVID came back positive. Cardiology was consulted for management of heart failure.   Heart Pathway Score:     Past Medical History:  Diagnosis Date  . Basal cell carcinoma of nose    "right side; burned it off"  . Carotid artery disease ([place])    a. s/p prior carotid endarterectomy.  . Cerebrovascular disease   . Chronic kidney disease (CKD), stage III (moderate)   . Coronary artery disease   . ED (erectile dysfunction)   . History of gout   . Hyperlipidemia   . Hypertension   . Insomnia   . Myocardial infarction ([place]) 2002  . PVD (peripheral vascular disease) ([place])    a. s/p right and left common  iliac artery angioplasty with bilateral stent placement.  . Syncope   . Unequal blood pressure in upper extremities     Past Surgical History:  Procedure Laterality Date  . ABDOMINAL HERNIA REPAIR  X 4  . APPENDECTOMY    . CAROTID ENDARTERECTOMY Bilateral   . CORONARY ANGIOPLASTY WITH STENT PLACEMENT     "~ 6 months before bypass"  . CORONARY ARTERY BYPASS GRAFT  2002   "CABG X4"  . HERNIA REPAIR    . PERIPHERAL VASCULAR CATHETERIZATION N/A 08/05/2014   Procedure: Abdominal Aortogram;  Surgeon: Wellington Hampshire, MD;   Location: [place] [facility];  Service: Cardiovascular;  Laterality: N/A;  . PERIPHERAL VASCULAR CATHETERIZATION Bilateral 08/05/2014   Procedure: Peripheral Vascular Intervention;  Surgeon: Wellington Hampshire, MD;  Location: [place] [facility];  Service: Cardiovascular;  Laterality: Bilateral;  iliacs     Home Medications:  Prior to Admission medications   Medication Sig Start Date End Date Taking? Authorizing Provider  acetaminophen (TYLENOL) 325 MG tablet Take 325-650 mg by mouth every 6 (six) hours as needed for mild pain or headache.   Yes [provider]  allopurinol (ZYLOPRIM) 100 MG tablet Take 100 mg by mouth at bedtime.  06/05/16  Yes [provider]  aspirin EC 81 MG tablet Take 81 mg by mouth daily.   Yes [provider]  atorvastatin (LIPITOR) 40 MG tablet Take 40 mg by mouth daily at 6 PM.    Yes [provider]  carvedilol (COREG) 3.125 MG tablet Take 1 tablet (3.125 mg total) by mouth 2 (two) times daily. 11/11/18  Yes Belva Crome, MD  cyanocobalamin 100 MCG tablet Take 100 mcg by mouth daily.   Yes [provider]  nitroGLYCERIN (NITROSTAT) 0.4 MG SL tablet Place 0.4 mg under the tongue every 5 (five) minutes as needed for chest pain.    Yes [provider]  ondansetron (ZOFRAN) 4 MG tablet Take 4 mg by mouth every 8 (eight) hours as needed for nausea or vomiting.  01/15/19  Yes [provider]  potassium chloride (KLOR-CON) 10 MEQ tablet Take 1 tablet (10 mEq total) by mouth daily. 01/17/19 04/17/19  Belva Crome, MD  torsemide (DEMADEX) 20 MG tablet Take 40 mg by mouth 2 (two) times daily.    [provider]    Inpatient Medications: Scheduled Meds: . allopurinol  100 mg Oral QHS  . aspirin EC  81 mg Oral Daily  . atorvastatin  40 mg Oral q1800  . carvedilol  3.125 mg Oral BID  . dexamethasone  6 mg Oral Daily  . docusate sodium  100 mg Oral Daily  . furosemide  20 mg Intravenous BID  . heparin  5,000  Units Subcutaneous Q8H  . megestrol  400 mg Oral Daily  . multivitamin with minerals  1 tablet Oral Daily  . potassium chloride  10 mEq Oral Daily  . sodium chloride flush  3 mL Intravenous Q12H  . sodium chloride flush  3 mL Intravenous Q12H  . cyanocobalamin  100 mcg Oral Daily  . vitamin C  500 mg Oral Daily  . zinc sulfate  220 mg Oral Daily   Continuous Infusions: . sodium chloride    . remdesivir 100 mg in NS 250 mL 100 mg (01/22/19 1020)   PRN Meds: sodium chloride, acetaminophen, albuterol, chlorpheniramine-HYDROcodone, guaiFENesin-dextromethorphan, lactulose, nitroGLYCERIN, ondansetron (ZOFRAN) IV, ondansetron, sodium chloride flush  Allergies:    Allergies  Allergen Reactions  . Bidil [Isosorb  Dinitrate-Hydralazine] Nausea Only and Other (See Comments)    Pt states he couldn't see, mind wasn't clear, and weakness  . Clindamycin/Lincomycin Other (See Comments)    Pt states caused c-diff and hospitalization    Social History:   Social History   Socioeconomic History  . Marital status: Widowed    Spouse name: Not on file  . Number of children: Not on file  . Years of education: Not on file  . Highest education level: Not on file  Occupational History  . Not on file  Social Needs  . Financial resource strain: Not on file  . Food insecurity    Worry: Not on file    Inability: Not on file  . Transportation needs    Medical: Not on file    Non-medical: Not on file  Tobacco Use  . Smoking status: Former Smoker    Packs/day: 1.00    Years: 56.00    Pack years: 56.00    Types: Cigarettes  . Smokeless tobacco: Never Used  . Tobacco comment: "stopped smoking when I had MI in 2002"  Substance and Sexual Activity  . Alcohol use: No  . Drug use: No  . Sexual activity: Not on file  Lifestyle  . Physical activity    Days per week: Not on file    Minutes per session: Not on file  . Stress: Not on file  Relationships  . Social Herbalist on phone: Not  on file    Gets together: Not on file    Attends religious service: Not on file    Active member of club or organization: Not on file    Attends meetings of clubs or organizations: Not on file    Relationship status: Not on file  . Intimate partner violence    Fear of current or ex partner: Not on file    Emotionally abused: Not on file    Physically abused: Not on file    Forced sexual activity: Not on file  Other Topics Concern  . Not on file  Social History Narrative  . Not on file    Family History:   Family History  Problem Relation Age of Onset  . Heart failure Mother   . Heart failure Father   . Heart attack Neg Hx   . Hypertension Neg Hx      ROS:  Please see the history of present illness.  All other ROS reviewed and negative.     Physical Exam/Data:   Vitals:   01/21/19 2027 01/22/19 0126 01/22/19 0743 01/22/19 0803  BP: 118/64 121/71 118/65 122/69  Pulse: 84   84  Resp: (!) 22 (!) 24 20 (!) 25  Temp: 97.8 F (36.6 C)   98.7 F (37.1 C)  TempSrc: Oral   Oral  SpO2: 90% 92% 94% 93%  Weight:      Height:        Intake/Output Summary (Last 24 hours) at 01/22/2019 1409 Last data filed at 01/22/2019 1020 Gross per 24 hour  Intake 250 ml  Output 650 ml  Net -400 ml   Last 3 Weights 01/21/2019 01/17/2019 12/05/2018  Weight (lbs) 153 lb 14.1 oz 160 lb 6.4 oz 163 lb  Weight (kg) 69.8 kg 72.757 kg 73.936 kg     Body mass index is 24.84 kg/m.  General:  Well nourished, well developed, in no acute distress HEENT: normal Lymph: no adenopathy Neck: no JVD Endocrine:  No thryomegaly Vascular: No carotid  bruits; FA pulses 2+ bilaterally without bruits  Cardiac:  normal S1, S2; RRR; no murmur  Lungs: mild crackles B/L; no wheezing, rhonchi or rales  Abd: soft, nontender, no hepatomegaly  Ext: 1-2+ pitting edema Musculoskeletal:  No deformities, BUE and BLE strength normal and equal Skin: warm and dry  Neuro:  CNs 2-12 intact, no focal abnormalities noted  Psych:  Normal affect   EKG:  The EKG was personally reviewed and demonstrates:  NSR, 80 bpm, LAD, TWI aVL, V2, nonspecific T wave changes, poor R wave progression  Telemetry:  Telemetry was personally reviewed and demonstrates:  N/A  Relevant CV Studies:  Echo 11/06/18  1. Moderate dyskinesis of the left ventricular, mid-apical anteroseptal wall.  2. The left ventricle has a visually estimated ejection fraction of 15-20%. The cavity size was mild to moderately dilated. Left ventricular diastolic Doppler parameters are consistent with pseudonormalization.  3. The right ventricle has moderately reduced systolic function. The cavity was normal. There is no increase in right ventricular wall thickness. Right ventricular systolic pressure is moderately elevated with an estimated pressure of 43.0 mmHg.  4. Left atrial size was mildly dilated.  5. Right atrial size was mildly dilated.  6. No evidence of mitral valve stenosis.  7. The aortic valve is abnormal. Moderate thickening of the aortic valve. Moderate calcification of the aortic valve. No stenosis of the aortic valve.  8. The aorta is normal unless otherwise noted.  9. The aortic root and ascending aorta are normal in size and structure. 10. The atrial septum is grossly normal.  Laboratory Data:  High Sensitivity Troponin:   Recent Labs  Lab 01/21/19 1200 01/21/19 1424 01/21/19 1758  TROPONINIHS 120* 134* 161*     Chemistry Recent Labs  Lab 01/17/2019 1847 01/21/19 0027 01/21/19 0447 01/22/19 0322  NA 140  --  141 142  K 3.2*  --  3.2* 3.5  CL 101  --  102 104  CO2 24  --  25 26  GLUCOSE 111*  --  107* 166*  BUN 64*  --  62* 75*  CREATININE 3.01* 2.78* 2.85* 2.94*  CALCIUM 8.3*  --  8.4* 8.5*  GFRNONAA 17* 19* 19* 18*  GFRAA 20* 22* 22* 21*  ANIONGAP 15  --  14 12    Recent Labs  Lab 02/06/2019 1847 01/21/19 0447 01/22/19 0322  PROT 6.1* 6.1* 5.4*  ALBUMIN 2.7* 2.6* 2.3*  AST 45* 39 33  ALT 37 34 30  ALKPHOS 123  124 116  BILITOT 2.0* 2.7* 1.9*   Hematology Recent Labs  Lab 01/21/19 0027 01/21/19 0447 01/22/19 0322  WBC 7.1 8.2 7.4  RBC 3.05* 3.12* 2.85*  HGB 10.6* 10.8* 9.9*  HCT 32.8* 33.2* 30.1*  MCV 107.5* 106.4* 105.6*  MCH 34.8* 34.6* 34.7*  MCHC 32.3 32.5 32.9  RDW 15.7* 15.8* 15.6*  PLT 155 163 170   BNP Recent Labs  Lab 02/04/2019 1837  BNP 3,833.7*    DDimer  Recent Labs  Lab 01/21/19 0447 01/22/19 0322  DDIMER 1.78* 1.48*     Radiology/Studies:  Dg Chest Port 1 View  Result Date: 01/22/2019 CLINICAL DATA:  Shortness of breath, COVID-19 positive EXAM: PORTABLE CHEST 1 VIEW COMPARISON:  02/04/2019 FINDINGS: Postsurgical changes again noted to the sternum and mediastinum. Mild cardiomegaly. Calcific aortic knob. Patchy bilateral airspace opacities with slight interval progression when compared to the prior study. No pneumothorax. IMPRESSION: Patchy bilateral airspace opacities with slight interval progression when compared to the prior study.  Electronically Signed   By: Davina Poke M.D.   On: 01/22/2019 11:49   Dg Chest Port 1 View  Result Date: 02/08/2019 CLINICAL DATA:  Shortness of breath EXAM: PORTABLE CHEST 1 VIEW COMPARISON:  12/19/2017 FINDINGS: Post sternotomy changes. Apices are obscured by the patient's neck and chin. Mild cardiomegaly. Interim development of interstitial and peripheral ground-glass opacity and consolidations. No pneumothorax. IMPRESSION: Interim development of diffuse bilateral interstitial and ground-glass opacities and patchy consolidations, some of which are peripheral in distribution. Findings suspect for multifocal infection, particularly atypical or viral pneumonia. Electronically Signed   By: Donavan Foil M.D.   On: 02/10/2019 19:10    Assessment and Plan:   Acute on chronic systolic and diastolic Heart Failure (EF 15-20%) Patient presented with worsening SOB, peripheral edema, orthopnea, and overall weakness. BNP elevated to  3,833. CXR significant for multifocal infection >> COVID positive.  - Patient had echo in August 2020 showing newly decreased EF 15%, changed from echo last year showing normal EF. He had been closely following with Dr. Tamala Julian and was on Torsemide 40 mg BID. At that time it was thought the patient was not a good candidate for advanced HF therapies due to age and DNR/ palliative care were discussed. Also of note creatinine was slowly getting worse. - On admission patient was started on IV lasix 20 mg BID - creatinine was 3.01 on admission. Today 2.94 - Patient has put out 460 mLsince admission - Weight 153 lbs on admission. Patient is unsure of dry weight at this time but did say he was having good output with Torsemide. - Would increase lasix to 80 mg IV BID - Monitor creatinine - continue coreg 3.125 mg BID. Hold off on ACE/ARB for kidney function. - Bidil was previously not tolerated for hypotension - do not suspect further work-up for newly reduced EF given patients age. Would continue medical management for now. Can consider HF consult if advanced therapies want to be considered. MD to see  COVID positive PNA - Symptoms of SOB, fatigue, diarrhea for the last couple days - CXR suggestive of multifocal infection - on 7L O2 - treatment per IM  AKI/CKD stage 3 - Creatinine 3.01 on admission. Has been trending down with diuresis. 2.94 today - continue to trend  HTN - BP stable with BB>> continue  Hypokalemia - supplemented - recheck in the AM  CAD s/p CABG in 2002 - Patient denies chest pain - HS troponin trend flat>> not consistent with ACS  For questions or updates, please contact [facility] Please consult [email or web address] for contact info under   Signed, Cadence Ninfa Meeker, PA-C  01/22/2019 2:09 PM   Patient seen, examined. Available data reviewed. Agree with findings, assessment, and plan as outlined by Cadence Kathlen Mody, PA-C.  The patient is independently interviewed and  examined with appropriate PPE in the setting of his active COVID-19 infection.  He is an elderly male, sitting up in a chair, breathing comfortably on O2 per nasal cannula.  JVP is moderately elevated, lung fields have diffuse rhonchi, heart is regular rate and rhythm with no murmur gallop, abdomen is soft and nontender, there is 1+ bilateral ankle edema.  Skin is warm and dry with no rash.  The patient has advanced heart failure with very severe LV dysfunction.  He also has stage IV chronic kidney disease with acute on chronic kidney injury this admission.  He remains modestly volume overloaded on exam.  We will increase his furosemide to 80 mg IV  twice daily but will need to follow his renal function very closely.  If his renal function trends up, will need to back off of furosemide.  He really is not a candidate for any advanced therapies.  Considering his baseline problems, advanced heart failure, and chronic kidney disease, he certainly has a poor prognosis in the setting of COVID-19 infection.  We will follow along with you.  Sherren Mocha, M.D. 01/22/2019 5:44 PM

## 2019-01-23 DIAGNOSIS — I5041 Acute combined systolic (congestive) and diastolic (congestive) heart failure: Secondary | ICD-10-CM

## 2019-01-23 LAB — CBC WITH DIFFERENTIAL/PLATELET
Abs Immature Granulocytes: 0.09 10*3/uL — ABNORMAL HIGH (ref 0.00–0.07)
Basophils Absolute: 0 10*3/uL (ref 0.0–0.1)
Basophils Relative: 0 %
Eosinophils Absolute: 0 10*3/uL (ref 0.0–0.5)
Eosinophils Relative: 0 %
HCT: 32.7 % — ABNORMAL LOW (ref 39.0–52.0)
Hemoglobin: 11 g/dL — ABNORMAL LOW (ref 13.0–17.0)
Immature Granulocytes: 1 %
Lymphocytes Relative: 6 %
Lymphs Abs: 0.8 10*3/uL (ref 0.7–4.0)
MCH: 34.5 pg — ABNORMAL HIGH (ref 26.0–34.0)
MCHC: 33.6 g/dL (ref 30.0–36.0)
MCV: 102.5 fL — ABNORMAL HIGH (ref 80.0–100.0)
Monocytes Absolute: 0.4 10*3/uL (ref 0.1–1.0)
Monocytes Relative: 3 %
Neutro Abs: 12.9 10*3/uL — ABNORMAL HIGH (ref 1.7–7.7)
Neutrophils Relative %: 90 %
Platelets: 218 10*3/uL (ref 150–400)
RBC: 3.19 MIL/uL — ABNORMAL LOW (ref 4.22–5.81)
RDW: 15.5 % (ref 11.5–15.5)
WBC: 13.8 10*3/uL — ABNORMAL HIGH (ref 4.0–10.5)
nRBC: 0.1 % (ref 0.0–0.2)

## 2019-01-23 LAB — COMPREHENSIVE METABOLIC PANEL
ALT: 35 U/L (ref 0–44)
AST: 45 U/L — ABNORMAL HIGH (ref 15–41)
Albumin: 2.7 g/dL — ABNORMAL LOW (ref 3.5–5.0)
Alkaline Phosphatase: 138 U/L — ABNORMAL HIGH (ref 38–126)
Anion gap: 15 (ref 5–15)
BUN: 88 mg/dL — ABNORMAL HIGH (ref 8–23)
CO2: 24 mmol/L (ref 22–32)
Calcium: 8.7 mg/dL — ABNORMAL LOW (ref 8.9–10.3)
Chloride: 102 mmol/L (ref 98–111)
Creatinine, Ser: 3.07 mg/dL — ABNORMAL HIGH (ref 0.61–1.24)
GFR calc Af Amer: 20 mL/min — ABNORMAL LOW (ref >60)
GFR calc non Af Amer: 17 mL/min — ABNORMAL LOW (ref >60)
Glucose, Bld: 123 mg/dL — ABNORMAL HIGH (ref 70–99)
Potassium: 3.9 mmol/L (ref 3.5–5.1)
Sodium: 141 mmol/L (ref 135–145)
Total Bilirubin: 1.8 mg/dL — ABNORMAL HIGH (ref 0.3–1.2)
Total Protein: 6.4 g/dL — ABNORMAL LOW (ref 6.5–8.1)

## 2019-01-23 LAB — C-REACTIVE PROTEIN: CRP: 7.1 mg/dL — ABNORMAL HIGH (ref <1.0)

## 2019-01-23 LAB — PHOSPHORUS: Phosphorus: 3.7 mg/dL (ref 2.5–4.6)

## 2019-01-23 LAB — MAGNESIUM: Magnesium: 1.9 mg/dL (ref 1.7–2.4)

## 2019-01-23 LAB — D-DIMER, QUANTITATIVE: D-Dimer, Quant: 2.16 ug/mL-FEU — ABNORMAL HIGH (ref 0.00–0.50)

## 2019-01-23 LAB — FERRITIN: Ferritin: 501 ng/mL — ABNORMAL HIGH (ref 24–336)

## 2019-01-23 MED ORDER — TORSEMIDE 20 MG PO TABS
40.0000 mg | ORAL_TABLET | Freq: Two times a day (BID) | ORAL | Status: DC
  Administered 2019-01-24 (×2): 40 mg via ORAL
  Filled 2019-01-23 (×3): qty 2

## 2019-01-23 NOTE — Progress Notes (Signed)
Progress Note  Patient Name: Danny [name] Date of Encounter: 01/23/2019  Primary Cardiologist: Belva Crome III, MD   Subjective   Residual shortness of breath, but feeling better than earlier today. Sitting in chair eating lunch at the time of my evaluation this afternoon. No CP.  Inpatient Medications    Scheduled Meds: . allopurinol  100 mg Oral QHS  . aspirin EC  81 mg Oral Daily  . atorvastatin  40 mg Oral q1800  . carvedilol  3.125 mg Oral BID  . dexamethasone  6 mg Oral Daily  . docusate sodium  100 mg Oral Daily  . furosemide  80 mg Intravenous BID  . heparin  5,000 Units Subcutaneous Q8H  . megestrol  400 mg Oral Daily  . multivitamin with minerals  1 tablet Oral Daily  . potassium chloride  10 mEq Oral Daily  . sodium chloride flush  3 mL Intravenous Q12H  . sodium chloride flush  3 mL Intravenous Q12H  . cyanocobalamin  100 mcg Oral Daily  . vitamin C  500 mg Oral Daily  . zinc sulfate  220 mg Oral Daily   Continuous Infusions: . sodium chloride    . remdesivir 100 mg in NS 250 mL 100 mg (01/23/19 1125)   PRN Meds: sodium chloride, acetaminophen, albuterol, chlorpheniramine-HYDROcodone, guaiFENesin-dextromethorphan, lactulose, nitroGLYCERIN, ondansetron (ZOFRAN) IV, ondansetron, sodium chloride flush   Vital Signs    Vitals:   01/23/19 0738 01/23/19 0800 01/23/19 1537 01/23/19 2000  BP: 116/70  125/67 123/80  Pulse: 84  79 90  Resp: (!) 26 15 16  (!) 25  Temp: 97.7 F (36.5 C)  97.8 F (36.6 C)   TempSrc: Oral  Oral   SpO2: 99%  100% 97%  Weight:      Height:        Intake/Output Summary (Last 24 hours) at 01/23/2019 2124 Last data filed at 01/23/2019 1500 Gross per 24 hour  Intake 605 ml  Output 600 ml  Net 5 ml   Last 3 Weights 01/21/2019 01/17/2019 12/05/2018  Weight (lbs) 153 lb 14.1 oz 160 lb 6.4 oz 163 lb  Weight (kg) 69.8 kg 72.757 kg 73.936 kg       Physical Exam  Elderly male in NAD GEN: No acute distress.   Neck: No JVD  Cardiac: RRR, no murmurs, rubs, or gallops.  Respiratory: BL rhonchi, no rales GI: Soft, nontender, non-distended  MS: 1+ bilateral ankle edema; No deformity. Neuro:  Nonfocal  Psych: Normal affect   Labs    High Sensitivity Troponin:   Recent Labs  Lab 01/21/19 1200 01/21/19 1424 01/21/19 1758  TROPONINIHS 120* 134* 161*      Chemistry Recent Labs  Lab 01/21/19 0447 01/22/19 0322 01/23/19 0413  NA 141 142 141  K 3.2* 3.5 3.9  CL 102 104 102  CO2 25 26 24   GLUCOSE 107* 166* 123*  BUN 62* 75* 88*  CREATININE 2.85* 2.94* 3.07*  CALCIUM 8.4* 8.5* 8.7*  PROT 6.1* 5.4* 6.4*  ALBUMIN 2.6* 2.3* 2.7*  AST 39 33 45*  ALT 34 30 35  ALKPHOS 124 116 138*  BILITOT 2.7* 1.9* 1.8*  GFRNONAA 19* 18* 17*  GFRAA 22* 21* 20*  ANIONGAP 14 12 15      Hematology Recent Labs  Lab 01/21/19 0447 01/22/19 0322 01/23/19 0413  WBC 8.2 7.4 13.8*  RBC 3.12* 2.85* 3.19*  HGB 10.8* 9.9* 11.0*  HCT 33.2* 30.1* 32.7*  MCV 106.4* 105.6* 102.5*  MCH 34.6*  34.7* 34.5*  MCHC 32.5 32.9 33.6  RDW 15.8* 15.6* 15.5  PLT 163 170 218    BNP Recent Labs  Lab 02/06/2019 1837  BNP 3,833.7*     DDimer  Recent Labs  Lab 01/21/19 0447 01/22/19 0322 01/23/19 0413  DDIMER 1.78* 1.48* 2.16*     Radiology    Dg Chest Port 1 View  Result Date: 01/22/2019 CLINICAL DATA:  Shortness of breath, COVID-19 positive EXAM: PORTABLE CHEST 1 VIEW COMPARISON:  02/05/2019 FINDINGS: Postsurgical changes again noted to the sternum and mediastinum. Mild cardiomegaly. Calcific aortic knob. Patchy bilateral airspace opacities with slight interval progression when compared to the prior study. No pneumothorax. IMPRESSION: Patchy bilateral airspace opacities with slight interval progression when compared to the prior study. Electronically Signed   By: Davina Poke M.D.   On: 01/22/2019 11:49     Patient Profile     85 y.o. male with acute on chronic systolic HF with severely reduced LVEF < 20%  hospitalized with Covid-19 pneumonia  Assessment & Plan    1. Acute on chronic systolic HF: limited options for medical therapy with advanced age and stage 4 CKD. Continue low dose carvedilol and convert IV furosemide back to home dose of torsemide 40 mg BID tomorrow am. OK to tx to [facility] from cardiology perspective.   2. CAD s/p CABG: no active angina. Continue medical therapy.   3. CKI on CKD 4: I suspect we have pushed his kidneys as much as he can tolerate with IV diuresis. Creatinine trending up slightly to 3.07 today.  [facility] will sign off.   Medication Recommendations:  Stop IV lasix after today, resume torsemide 40 mg BID tomorrow Other recommendations (labs, testing, etc):  OK to tx to [facility] for further Covid 19 treatment if indicated Follow up as an outpatient:  Dr Tamala Julian - will arrange  For questions or updates, please contact [facility] Please consult [email or web address] for contact info under        Signed, Sherren Mocha, MD  01/23/2019, 9:24 PM

## 2019-01-23 NOTE — Progress Notes (Signed)
Pt states feeling sob and like he is "going to die".  Does not appear in acute respiratory distress.  Oxygen 98% on 4L [place] and breathing unlabored yet tachypenic.  Provided reassurance to patient.  Given scheduled Lasix, PRN Albuterol, and incentive spirometer.  Pt educated on IS.

## 2019-01-23 NOTE — Progress Notes (Signed)
[facility]  PROGRESS NOTE  Danny [name] JYN:[id number] DOB: 1927/02/02 DOA: 01/29/2019 PCP: Lavone Orn, MD Admit date - 01/28/2019   Admitting Physician Elwyn Reach, MD  Outpatient Primary MD for the patient is Lavone Orn, MD  LOS - 3 Brief Narrative   Danny [name] is a 85 y.o. year old male with medical history significant for systolic dysfunction CHF with EF of 15%, history of CVA, chronic kidney disease stage III, coronary artery disease, hyperlipidemia, hypertension, peripheral vascular disease who presented on 01/31/2019 with progressive shortness of breath, cough, fatigue and chest pain and was found to have acute hypoxic respiratory failure of multifactorial etiology including COVID-19 pneumonia and acute on chronic CHF exacerbation with worsening renal function.  Hospital course complicated by persistently high oxygen requirements related to multifocal Covid infection and acute on chronic CHF exacerbation  Subjective  Danny [name] today has persistent shortness of breath.  No chest pain.  Minimal cough.  Tolerating diet. A & P    1. Acute hypoxic respiratory failure, multifactorial, improving-including Covid pneumonia and CHF exacerbation, improving now on 4L down from 8 L in 24 hours with more aggressive diuresis.. Continue COVID treatment.  IV lasix diuresis and cardiology assisting.   2. Covid pneumonia, concern for worsening disease repeat CXR shows slight interval progression of airspace opacities.  Ferritin and d-dimer have increased slightly  (429--->501; 1.48-->2.16 respectively), CRP 7.1 down from 8.1, white count back up to 13.8 previusly 7.4 Continue to monitor serial inflammatory markers, continue Decadron, remdesivir, vitamin C, zinc.  Discussed with cardiology no plans for further intervention, will initiate [facility] transfer   3. Acute on chronic CHF with reduced ejection fraction exacerbation, slightly improving.  O2 requirements have decreased, not much  output charted and no weights today so far, clinically may be improving some, no worsening of renal function with IV lasix 80mg  BID. Anticipate will continue diuresis given not candidate for other options,  cardiology agrees with plan to deescalate to oral regimen in 24 hours,no plan for invasive intervention from their standpoint and ok to transfer to [place], palliative consulted.   4. AKI on CKD stage IV,stable. likely prerenal in setting of CHF exacerbation, baseline cr prior to admission 1.7-2. Has been 2.7-3 this admission, 3.07 today, slightly up from 2.94. hopeful improvement with continued diuresis, avoid nephrotoxins, monitor BMP  5. Hypokalemia, in the setting of active diuresis improved with repletion,  BMP in a.m., check mag as well  6. Macrocytic anemia, stable.  Hemoglobin stable at around 10, no signs of bleeding, continue home vitamin B12  7. CAD status post CABG (2002).  Asymptomatic.  Troponin trend flat neck not consistent with ACS, most likely related to Covid  8. HTN, currently normotensive.  Continue carvedilol  9. Gout, stable continue allopurinol  10. HLD, stable continue Lipitor     Family Communication  :  Spoke with Danny [name] at [phone] and gave an update on 11/11  Code Status :  DNR  Disposition Plan  :  Continue as inpatient needing continued IV diuresis for CHF exacerbation and remesdevir for COVID and persistent oxygen requirement, plan to deescalate to oral lasix in 24 hours pending clinical improvement, no plans for invasive intervention will initiate transfer to [place]  :  Cardiology  Procedures  :  none  DVT Prophylaxis  :  Heparin   Lab Results  Component Value Date   PLT 218 01/23/2019    Diet :  Diet Order  Diet Heart Room service appropriate? Yes; Fluid consistency: Thin  Diet effective now               Inpatient Medications Scheduled Meds: . allopurinol  100 mg Oral QHS  . aspirin EC  81 mg Oral  Daily  . atorvastatin  40 mg Oral q1800  . carvedilol  3.125 mg Oral BID  . dexamethasone  6 mg Oral Daily  . docusate sodium  100 mg Oral Daily  . furosemide  80 mg Intravenous BID  . heparin  5,000 Units Subcutaneous Q8H  . megestrol  400 mg Oral Daily  . multivitamin with minerals  1 tablet Oral Daily  . potassium chloride  10 mEq Oral Daily  . sodium chloride flush  3 mL Intravenous Q12H  . sodium chloride flush  3 mL Intravenous Q12H  . cyanocobalamin  100 mcg Oral Daily  . vitamin C  500 mg Oral Daily  . zinc sulfate  220 mg Oral Daily   Continuous Infusions: . sodium chloride    . remdesivir 100 mg in NS 250 mL 100 mg (01/23/19 1125)   PRN Meds:.sodium chloride, acetaminophen, albuterol, chlorpheniramine-HYDROcodone, guaiFENesin-dextromethorphan, lactulose, nitroGLYCERIN, ondansetron (ZOFRAN) IV, ondansetron, sodium chloride flush  Antibiotics  :   Anti-infectives (From admission, onward)   Start     Dose/Rate Route Frequency Ordered Stop   01/22/19 1000  remdesivir 100 mg in sodium chloride 0.9 % 250 mL IVPB     100 mg 500 mL/hr over 30 Minutes Intravenous Every 24 hours 01/21/19 0330 01/26/19 0959   01/21/19 0530  remdesivir 200 mg in sodium chloride 0.9 % 250 mL IVPB     200 mg 500 mL/hr over 30 Minutes Intravenous Once 01/21/19 0330 01/21/19 1900       Objective   Vitals:   01/22/19 1640 01/22/19 2113 01/23/19 0700 01/23/19 0738  BP: 121/69 122/62  116/70  Pulse:  82  84  Resp: 17 20 20  (!) 26  Temp:  (!) 97.5 F (36.4 C)  97.7 F (36.5 C)  TempSrc:  Oral  Oral  SpO2: 100% 98%  99%  Weight:      Height:        SpO2: 99 % O2 Flow Rate (L/min): 4 L/min  Wt Readings from Last 3 Encounters:  01/21/19 69.8 kg  01/17/19 72.8 kg  12/05/18 73.9 kg     Intake/Output Summary (Last 24 hours) at 01/23/2019 1240 Last data filed at 01/23/2019 0555 Gross per 24 hour  Intake 120 ml  Output 1000 ml  Net -880 ml    Physical Exam:  Awake Alert, Oriented  X 3, Normal affect No new F.N deficits,  Danny [name], JVD noted Symmetrical Chest wall movement, no rhonchi ( much improved from prior exam), crackles at bases, no respiratory distress on 4 LNC RRR,No Gallops,Rubs or new Murmurs,  +ve B.Sounds, Abd Soft, No tenderness,  No rebound, guarding or rigidity. No Cyanosis,    I have personally reviewed the following:   Data Reviewed:  CBC Recent Labs  Lab 01/22/2019 1847 01/21/19 0027 01/21/19 0447 01/22/19 0322 01/23/19 0413  WBC 6.2 7.1 8.2 7.4 13.8*  HGB 10.3* 10.6* 10.8* 9.9* 11.0*  HCT 31.1* 32.8* 33.2* 30.1* 32.7*  PLT 148* 155 163 170 218  MCV 104.7* 107.5* 106.4* 105.6* 102.5*  MCH 34.7* 34.8* 34.6* 34.7* 34.5*  MCHC 33.1 32.3 32.5 32.9 33.6  RDW 15.5 15.7* 15.8* 15.6* 15.5  LYMPHSABS 0.4*  --  0.5* 0.5* 0.8  MONOABS  0.3  --  0.3 0.2 0.4  EOSABS 0.0  --  0.0 0.0 0.0  BASOSABS 0.0  --  0.0 0.0 0.0    Chemistries  Recent Labs  Lab 01/17/19 1144 02/04/2019 1847 01/21/19 0027 01/21/19 0447 01/22/19 0322 01/23/19 0413  NA 140 140  --  141 142 141  K 3.2* 3.2*  --  3.2* 3.5 3.9  CL 97 101  --  102 104 102  CO2 24 24  --  25 26 24   GLUCOSE 100* 111*  --  107* 166* 123*  BUN 56* 64*  --  62* 75* 88*  CREATININE 2.76* 3.01* 2.78* 2.85* 2.94* 3.07*  CALCIUM 8.5* 8.3*  --  8.4* 8.5* 8.7*  MG  --   --   --  1.6* 1.8 1.9  AST  --  45*  --  39 33 45*  ALT  --  37  --  34 30 35  ALKPHOS  --  123  --  124 116 138*  BILITOT  --  2.0*  --  2.7* 1.9* 1.8*   ------------------------------------------------------------------------------------------------------------------ No results for input(s): CHOL, HDL, LDLCALC, TRIG, CHOLHDL, LDLDIRECT in the last 72 hours.  No results found for: HGBA1C ------------------------------------------------------------------------------------------------------------------ Recent Labs    01/21/19 0027  TSH 6.836*    ------------------------------------------------------------------------------------------------------------------ Recent Labs    01/22/19 0322 01/23/19 0413  FERRITIN 429* 501*    Coagulation profile No results for input(s): INR, PROTIME in the last 168 hours.  Recent Labs    01/22/19 0322 01/23/19 0413  DDIMER 1.48* 2.16*    Cardiac Enzymes No results for input(s): CKMB, TROPONINI, MYOGLOBIN in the last 168 hours.  Invalid input(s): CK ------------------------------------------------------------------------------------------------------------------    Component Value Date/Time   BNP 3,833.7 (H) 01/19/2019 1837    Micro Results Recent Results (from the past 240 hour(s))  SARS CORONAVIRUS 2 (TAT 6-24 HRS) Nasopharyngeal Nasopharyngeal Swab     Status: Abnormal   Collection Time: 01/31/2019  7:18 PM   Specimen: Nasopharyngeal Swab  Result Value Ref Range Status   SARS Coronavirus 2 POSITIVE (A) NEGATIVE Final    Comment: RESULT CALLED TO, READ BACK BY AND VERIFIED WITH: JAMES RAMOS RN.@0131  ON 11.10.2020 BY TCALDWELL MT. (NOTE) SARS-CoV-2 target nucleic acids are DETECTED. The SARS-CoV-2 RNA is generally detectable in upper and lower respiratory specimens during the acute phase of infection. Positive results are indicative of active infection with SARS-CoV-2. Clinical  correlation with patient history and other diagnostic information is necessary to determine patient infection status. Positive results do  not rule out bacterial infection or co-infection with other viruses. The expected result is Negative. Fact Sheet for Patients: SugarRoll.be Fact Sheet for Healthcare Providers: https://www.woods-mathews.com/ This test is not yet approved or cleared by the [place] FDA and  has been authorized for detection and/or diagnosis of SARS-CoV-2 by FDA under an Emergency Use Authorization (EUA). This EUA will remain  in effect  (meaning this test ca n be used) for the duration of the COVID-19 declaration under Section 564(b)(1) of the Act, 21 U.S.C. section 360bbb-3(b)(1), unless the authorization is terminated or revoked sooner. Performed at [facility], [place] [address] [place], [place] [address]     Radiology Reports Dg Chest [place] 1 View  Result Date: 01/22/2019 CLINICAL DATA:  Shortness of breath, COVID-19 positive EXAM: PORTABLE CHEST 1 VIEW COMPARISON:  01/16/2019 FINDINGS: Postsurgical changes again noted to the sternum and mediastinum. Mild cardiomegaly. Calcific aortic knob. Patchy bilateral airspace opacities with slight interval progression  when compared to the prior study. No pneumothorax. IMPRESSION: Patchy bilateral airspace opacities with slight interval progression when compared to the prior study. Electronically Signed   By: Davina Poke M.D.   On: 01/22/2019 11:49   Dg Chest Port 1 View  Result Date: 02/08/2019 CLINICAL DATA:  Shortness of breath EXAM: PORTABLE CHEST 1 VIEW COMPARISON:  12/19/2017 FINDINGS: Post sternotomy changes. Apices are obscured by the patient's neck and chin. Mild cardiomegaly. Interim development of interstitial and peripheral ground-glass opacity and consolidations. No pneumothorax. IMPRESSION: Interim development of diffuse bilateral interstitial and ground-glass opacities and patchy consolidations, some of which are peripheral in distribution. Findings suspect for multifocal infection, particularly atypical or viral pneumonia. Electronically Signed   By: Donavan Foil M.D.   On: 01/30/2019 19:10     Time Spent in minutes  30     Desiree Hane M.D on 01/23/2019 at 12:40 PM  To page go to [email or web address] - password [facility]

## 2019-01-23 NOTE — Progress Notes (Signed)
Hydrologist [facility])  Hospital Liaison: RN note    Notified by Elenor Quinones, CMRN of patient/family request for [facility] Palliative services at home after discharge.    Writer spoke with daughter, [name] to initiate education related to [facility] Palliative Program.  [name] verbalized understanding of information given. Per discussion, plan is for the [facility] to follow up with Hoyle Sauer and schedule an appointment after patient discharges home.    Please call with any palliative related questions.   Thank you for this referral.     Farrel Gordon, RN, CCM  [place] (listed on AMION under Hospice and [place] [facility] [place])  [phone]

## 2019-01-23 NOTE — Plan of Care (Signed)
  Problem: Clinical Measurements: Goal: Respiratory complications will improve Outcome: Progressing Goal: Cardiovascular complication will be avoided Outcome: Progressing   

## 2019-01-23 NOTE — TOC Initial Note (Addendum)
Transition of Care [facility]) - Initial/Assessment Note    Patient Details  Name: Danny [name] MRN: [id number] Date of Birth: [birth year]  [facility] CM/SW Contact:    Maryclare Labrador, RN Phone Number: 01/23/2019, 1:08 PM  Clinical Narrative:   PTA independent from home alone, pt was driving prior to this event.  Pt deferred discharge planning conversation to daughter [name].  [name] informed CM that pt will have 24 hour supervision at discharge.  [name] is in agreement with [place] as recommended, CM also recommends [facility] for CHF disease management and new covid management.  CM requested daughter to search for medicare.gov HH compare list for local HH agenices - CM will follow back up with daughter for choice.  Pt has 3:1 already in the home and will need RW - order requested.  Daughter would like to discuss [place] with [facility] and may likely chose to have referral made to [place] program.  CM will continue to follow .  Palliative consult ordered     Update:  Daughter chose [place] - agency  accepted referral.  Per collaboration with [facility] CM made referral to [place] for community palliative program        Expected Discharge Plan: [place] Barriers to Discharge: Continued Medical Work up   Patient Goals and CMS Choice Patient states their goals for this hospitalization and ongoing recovery are:: Pt deferred conversation to his daughter [name]   Choice offered to / list presented to : Adult Children  Expected Discharge Plan and Services Expected Discharge Plan: [place] Choice: Home Health, Durable Medical Equipment Living arrangements for the past 2 months: Single Family Home                           HH Arranged: RN, PT, OT [facility] Agency: Protection Date [facility] Agency Contacted: 01/23/19 Time [place]: 1305 Representative spoke with at [place] Arrangements/Services Living arrangements for the past 2 months: [place] with:: Self Patient language and need for interpreter reviewed:: Yes Do you feel safe going back to the place where you live?: (pt stated yes when asked via phone)      Need for Family Participation in Patient Care: Yes (Comment) Care giver support system in place?: Yes (comment) Current home services: DME(3:1) Criminal Activity/Legal Involvement Pertinent to Current Situation/Hospitalization: No - Comment as needed  Activities of Daily Living      Permission Sought/Granted   Permission granted to share information with : Yes, Verbal Permission Granted        Permission granted to share info w Relationship: Daughter     Emotional Assessment       Orientation: : Oriented to Self, Oriented to Place, Oriented to Situation   Psych Involvement: No (comment)  Admission diagnosis:  Acute combined systolic and diastolic congestive heart failure ([place]) [I50.41] COVID-19 [U07.1] Patient Active Problem List   Diagnosis Date Noted  . Acute respiratory failure with hypoxia ([place]) 01/22/2019  . COVID-19 01/21/2019  . Acute CHF (congestive heart failure) ([place]) 02/06/2019  . Near syncope 12/19/2017  . Gout 12/19/2017  . Anemia of chronic disease 12/19/2017  . Stenosis of left subclavian artery ([place]) 06/05/2016  . History of bilateral carotid endarterectomy 06/04/2016  . PAD (peripheral artery disease) ([place]) 08/05/2014  . Essential hypertension, benign  02/12/2013  . Coronary artery disease involving coronary bypass graft of native heart with angina pectoris ([place]) 02/12/2013  . CKD (chronic kidney disease) stage 4, GFR 15-29 ml/min (HCC) 02/12/2013  . Hyperlipidemia 02/12/2013  . C. difficile colitis 08/26/2011  . Hypotension 08/24/2011  . Hypokalemia 08/24/2011  . Anemia 08/24/2011   PCP:  Lavone Orn, MD Pharmacy:   [facility], [place] [address] Phone: [phone] Fax: [phone]  [id number] [place], [place] [address] Phone: [phone] Fax: [phone]     Social Determinants of Health (SDOH) Interventions    Readmission Risk Interventions No flowsheet data found.

## 2019-01-23 NOTE — Progress Notes (Signed)
Pt's daughter updated on plan of care and all questions answered.

## 2019-01-24 DIAGNOSIS — R1310 Dysphagia, unspecified: Secondary | ICD-10-CM

## 2019-01-24 LAB — INFLUENZA PANEL BY PCR (TYPE A & B)
Influenza A By PCR: NEGATIVE
Influenza B By PCR: NEGATIVE

## 2019-01-24 LAB — CBC WITH DIFFERENTIAL/PLATELET
Abs Immature Granulocytes: 0.15 10*3/uL — ABNORMAL HIGH (ref 0.00–0.07)
Basophils Absolute: 0 10*3/uL (ref 0.0–0.1)
Basophils Relative: 0 %
Eosinophils Absolute: 0 10*3/uL (ref 0.0–0.5)
Eosinophils Relative: 0 %
HCT: 33.4 % — ABNORMAL LOW (ref 39.0–52.0)
Hemoglobin: 11.1 g/dL — ABNORMAL LOW (ref 13.0–17.0)
Immature Granulocytes: 1 %
Lymphocytes Relative: 6 %
Lymphs Abs: 0.9 10*3/uL (ref 0.7–4.0)
MCH: 34.6 pg — ABNORMAL HIGH (ref 26.0–34.0)
MCHC: 33.2 g/dL (ref 30.0–36.0)
MCV: 104 fL — ABNORMAL HIGH (ref 80.0–100.0)
Monocytes Absolute: 0.7 10*3/uL (ref 0.1–1.0)
Monocytes Relative: 4 %
Neutro Abs: 14.3 10*3/uL — ABNORMAL HIGH (ref 1.7–7.7)
Neutrophils Relative %: 89 %
Platelets: 260 10*3/uL (ref 150–400)
RBC: 3.21 MIL/uL — ABNORMAL LOW (ref 4.22–5.81)
RDW: 15.8 % — ABNORMAL HIGH (ref 11.5–15.5)
WBC: 16.6 10*3/uL — ABNORMAL HIGH (ref 4.0–10.5)
nRBC: 0.2 % (ref 0.0–0.2)

## 2019-01-24 LAB — RESPIRATORY PANEL BY PCR

## 2019-01-24 LAB — COMPREHENSIVE METABOLIC PANEL
ALT: 38 U/L (ref 0–44)
AST: 51 U/L — ABNORMAL HIGH (ref 15–41)
Albumin: 2.8 g/dL — ABNORMAL LOW (ref 3.5–5.0)
Alkaline Phosphatase: 154 U/L — ABNORMAL HIGH (ref 38–126)
Anion gap: 17 — ABNORMAL HIGH (ref 5–15)
BUN: 104 mg/dL — ABNORMAL HIGH (ref 8–23)
CO2: 24 mmol/L (ref 22–32)
Calcium: 8.6 mg/dL — ABNORMAL LOW (ref 8.9–10.3)
Chloride: 99 mmol/L (ref 98–111)
Creatinine, Ser: 3.1 mg/dL — ABNORMAL HIGH (ref 0.61–1.24)
GFR calc Af Amer: 19 mL/min — ABNORMAL LOW (ref >60)
GFR calc non Af Amer: 17 mL/min — ABNORMAL LOW (ref >60)
Glucose, Bld: 155 mg/dL — ABNORMAL HIGH (ref 70–99)
Potassium: 4.1 mmol/L (ref 3.5–5.1)
Sodium: 140 mmol/L (ref 135–145)
Total Bilirubin: 2.5 mg/dL — ABNORMAL HIGH (ref 0.3–1.2)
Total Protein: 6.4 g/dL — ABNORMAL LOW (ref 6.5–8.1)

## 2019-01-24 LAB — MAGNESIUM: Magnesium: 2.1 mg/dL (ref 1.7–2.4)

## 2019-01-24 LAB — D-DIMER, QUANTITATIVE: D-Dimer, Quant: 2.4 ug/mL-FEU — ABNORMAL HIGH (ref 0.00–0.50)

## 2019-01-24 LAB — PHOSPHORUS: Phosphorus: 4.7 mg/dL — ABNORMAL HIGH (ref 2.5–4.6)

## 2019-01-24 LAB — FERRITIN: Ferritin: 537 ng/mL — ABNORMAL HIGH (ref 24–336)

## 2019-01-24 LAB — C-REACTIVE PROTEIN: CRP: 4.8 mg/dL — ABNORMAL HIGH (ref <1.0)

## 2019-01-24 MED ORDER — TRAZODONE HCL 50 MG PO TABS
50.0000 mg | ORAL_TABLET | Freq: Every evening | ORAL | Status: DC | PRN
  Administered 2019-01-25: 50 mg via ORAL
  Filled 2019-01-24: qty 1

## 2019-01-24 NOTE — Progress Notes (Addendum)
Daily progress Note Pt is A/O to self, situation and year . Pt  c/o having a hard time eating, Dr. Lonny Prude has put in an order for SLP.Marland Kitchen Pt is on a puree diet while awaiting swallow eval, pt is on a heart healthy diet. Pt sat in the chair from 0900-1330 and tolerated activity well. Pt needs to be reoriented about the condom catheter, and the call bell. Pt usually stands or tries to get out of bed when needing to use the BR. Pt will be going to [place]. Report given to [facility]

## 2019-01-24 NOTE — Progress Notes (Signed)
Patient transferred to [facility] per order, alert and anxious, daughter made aware.

## 2019-01-24 NOTE — Progress Notes (Signed)
Occupational Therapy Treatment Patient Details Name: Danny [name] MRN: [id number] DOB: [birth year] Today's Date: 01/24/2019    History of present illness 85 y.o. male with medical history significant of systolic dysfunction CHF with EF of 15%, history of CVA, chronic kidney disease stage III, coronary artery disease, hyperlipidemia, hypertension, peripheral vascular disease who presents to the ER with progressive shortness of breath cough fatigue and chest pain. Admitted 01/19/2019 with acute on chronic CHF exacerbation with worsening renal function, inpresence of COVID+   OT comments  This 85 yo male admitted with above presents to acute OT today with tolerating less activity today than on eval for standing at sink to groom with increased work of breathing, but sats did remain 90-100% on 4 liters HFNC. He will continue to benefit from acute OT with follow up [place].  Follow Up Recommendations  Home health OT;Supervision/Assistance - 24 hour    Equipment Recommendations  3 in 1 bedside commode       Precautions / Restrictions Precautions Precautions: Fall;Other (comment)(monitor O2 sats) Restrictions Weight Bearing Restrictions: No       Mobility Bed Mobility Overal bed mobility: Needs Assistance Bed Mobility: Supine to Sit     Supine to sit: Min guard;HOB elevated     General bed mobility comments: Increased time and effort  Transfers Overall transfer level: Needs assistance Equipment used: Rolling walker (2 wheeled) Transfers: Sit to/from Stand Sit to Stand: Min guard         General transfer comment: Min Guard A for safety    Balance Overall balance assessment: Needs assistance Sitting-balance support: No upper extremity supported;Feet supported Sitting balance-Leahy Scale: Good     Standing balance support: No upper extremity supported;During functional activity Standing balance-Leahy Scale: Fair Standing balance comment: pt appears to have knee buckling while  standing at sink but after further incidents realize pt needs to bend knees with increased posterior lean to see taller therapist in the eyes due to pt kyphosis                           ADL either performed or assessed with clinical judgement   ADL Overall ADL's : Needs assistance/impaired     Grooming: Wash/dry face;Set up;Supervision/safety Grooming Details (indicate cue type and reason): min guard for safety initially then pt with a "bobble" --ended up sitting to rest at sink. Pt reported his breathing did not feel as good as when we started. O2 prob on pt's ear which he decided to wash and then we could not get a good reading (you could see his beathing was a bit more labored). New probe aquired and put on left hand with sats in low 90's on 4 liters HFNC.               Lower Body Dressing Details (indicate cue type and reason): Pt was unable to donn his socks while seated in recliner                     Vision Baseline Vision/History: Wears glasses Wears Glasses: Reading only Patient Visual Report: No change from baseline            Cognition Arousal/Alertness: Awake/alert Behavior During Therapy: WFL for tasks assessed/performed Overall Cognitive Status: Within Functional Limits for tasks assessed  General Comments: When we entered he was wanting to call his daughter, but he was holding the call bell when asking about this not the room phone.              General Comments SaO2 on 4L via HFNC 90-100%, difficult pleth wave form     Pertinent Vitals/ Pain       Pain Assessment: No/denies pain         Frequency  Min 3X/week        Progress Toward Goals  OT Goals(current goals can now be found in the care plan section)  Progress towards OT goals: Not progressing toward goals - comment  Acute Rehab OT Goals Patient Stated Goal: "go home"  Plan Discharge plan remains appropriate     Co-evaluation    PT/OT/SLP Co-Evaluation/Treatment: Yes Reason for Co-Treatment: For patient/therapist safety PT goals addressed during session: Mobility/safety with mobility;Strengthening/ROM OT goals addressed during session: Strengthening/ROM;ADL's and self-care      AM-PAC OT "6 Clicks" Daily Activity     Outcome Measure   Help from another person eating meals?: None Help from another person taking care of personal grooming?: A Little Help from another person toileting, which includes using toliet, bedpan, or urinal?: A Little Help from another person bathing (including washing, rinsing, drying)?: A Lot Help from another person to put on and taking off regular upper body clothing?: A Little Help from another person to put on and taking off regular lower body clothing?: A Lot 6 Click Score: 17    End of Session Equipment Utilized During Treatment: Oxygen(4 liters)  OT Visit Diagnosis: Unsteadiness on feet (R26.81);Other abnormalities of gait and mobility (R26.89);Muscle weakness (generalized) (M62.81)   Activity Tolerance Patient tolerated treatment well(but less than he did last session)   Patient Left in chair;with call bell/phone within reach;with chair alarm set   Nurse Communication          Time: [phone] OT Time Calculation (min): 26 min  Charges: OT General Charges $OT Visit: 1 Visit OT Treatments $Self Care/Home Management : 8-22 mins [place], OTR/L Acute [employer] Pager [phone] Office [phone]      Almon Register 01/24/2019, 6:11 PM

## 2019-01-24 NOTE — Progress Notes (Signed)
Physical Therapy Treatment Patient Details Name: Danny [name] MRN: [id number] DOB: [birth year] Today's Date: 01/24/2019    History of Present Illness 85 y.o. male with medical history significant of systolic dysfunction CHF with EF of 15%, history of CVA, chronic kidney disease stage III, coronary artery disease, hyperlipidemia, hypertension, peripheral vascular disease who presents to the ER with progressive shortness of breath cough fatigue and chest pain. Admitted 01/24/2019 with acute on chronic CHF exacerbation with worsening renal function, inpresence of COVID+    PT Comments    Pt in bed with confused look holding remote control stating "I'm trying to call my daughter." Pt agreeable to sit up with therapy to make call. Pt is limited in safe mobility by decreased cognition in presence of decreased strength and balance. Pt mobility is very slow putting him at increased risk of falling. However, pt is min guard for bed mobility, and transfers and hands on min guard-minA for steadying with ambulation of 40 feet in the room. D/c plans remain appropriate. PT will continue to follow acutely.   Follow Up Recommendations  Home health PT;Supervision/Assistance - 24 hour(not safe without 24 hr assist)     Equipment Recommendations  Rolling walker with 5" wheels    Recommendations for Other Services       Precautions / Restrictions Precautions Precautions: Fall;Other (comment)(monitor O2 sats) Restrictions Weight Bearing Restrictions: No    Mobility  Bed Mobility Overal bed mobility: Needs Assistance Bed Mobility: Supine to Sit     Supine to sit: Min guard;HOB elevated     General bed mobility comments: Increased time and effort  Transfers Overall transfer level: Needs assistance Equipment used: None Transfers: Sit to/from Stand Sit to Stand: Min guard         General transfer comment: Min Guard A for safety  Ambulation/Gait Ambulation/Gait assistance: Min assist;Min  guard Gait Distance (Feet): 40 Feet Assistive device: Rolling walker (2 wheeled) Gait Pattern/deviations: Step-through pattern;Decreased step length - right;Decreased step length - left;Shuffle Gait velocity: slowed Gait velocity interpretation: <1.8 ft/sec, indicate of risk for recurrent falls General Gait Details: min guard-minA for steadying with RW, vc for proximity to RW        Balance Overall balance assessment: Needs assistance Sitting-balance support: No upper extremity supported;Feet supported Sitting balance-Leahy Scale: Good     Standing balance support: No upper extremity supported;During functional activity Standing balance-Leahy Scale: Fair Standing balance comment: pt appears to have knee buckling while standing at sink but after further incidents realize pt needs to bend knees with increased posterior lean to see taller therapist in the eyes due to pt kyphosis                            Cognition Arousal/Alertness: Awake/alert Behavior During Therapy: WFL for tasks assessed/performed Overall Cognitive Status: Within Functional Limits for tasks assessed                                 General Comments: Very motivated and sweet. Age appropiate cognition         General Comments General comments (skin integrity, edema, etc.): SaO2 on 4L via HFNC 90-100%, difficult pleth wave form       Pertinent Vitals/Pain Pain Assessment: No/denies pain           PT Goals (current goals can now be found in the care plan section) Acute Rehab PT  Goals Patient Stated Goal: "go home" PT Goal Formulation: With patient Time For Goal Achievement: 02/05/19 Potential to Achieve Goals: Good Progress towards PT goals: Progressing toward goals    Frequency    Min 3X/week      PT Plan Current plan remains appropriate    Co-evaluation PT/OT/SLP Co-Evaluation/Treatment: Yes Reason for Co-Treatment: For patient/therapist safety PT goals addressed  during session: Mobility/safety with mobility;Strengthening/ROM OT goals addressed during session: Strengthening/ROM;ADL's and self-care      AM-PAC PT "6 Clicks" Mobility   Outcome Measure  Help needed turning from your back to your side while in a flat bed without using bedrails?: A Little Help needed moving from lying on your back to sitting on the side of a flat bed without using bedrails?: A Little Help needed moving to and from a bed to a chair (including a wheelchair)?: A Little Help needed standing up from a chair using your arms (e.g., wheelchair or bedside chair)?: A Little Help needed to walk in hospital room?: A Little Help needed climbing 3-5 steps with a railing? : A Lot 6 Click Score: 17    End of Session Equipment Utilized During Treatment: Oxygen Activity Tolerance: Patient tolerated treatment well Patient left: in chair;with call bell/phone within reach;with chair alarm set;with nursing/sitter in room Nurse Communication: Mobility status PT Visit Diagnosis: Unsteadiness on feet (R26.81);Other abnormalities of gait and mobility (R26.89);Muscle weakness (generalized) (M62.81)     Time: 9147-8295 PT Time Calculation (min) (ACUTE ONLY): 30 min  Charges:  $Gait Training: 8-22 mins                     Jayci Ellefson B. Migdalia Dk PT, DPT Acute Rehabilitation Services Pager [phone] Office [phone]    [name] 01/24/2019, 5:09 PM

## 2019-01-24 NOTE — Plan of Care (Signed)

## 2019-01-24 NOTE — Progress Notes (Signed)
Report given to Kandis Fantasia, RN

## 2019-01-24 NOTE — Progress Notes (Signed)
[facility]  PROGRESS NOTE  Danny [name] DUK:[id number] DOB: 01/30/1927 DOA: 01/23/2019 PCP: Lavone Orn, MD Admit date - 01/16/2019   Admitting Physician Elwyn Reach, MD  Outpatient Primary MD for the patient is Lavone Orn, MD  LOS - 4 Brief Narrative   Danny [name] is a 85 y.o. year old male with medical history significant for systolic dysfunction CHF with EF of 15%, history of CVA, chronic kidney disease stage III, coronary artery disease, hyperlipidemia, hypertension, peripheral vascular disease who presented on 01/24/2019 with progressive shortness of breath, cough, fatigue and chest pain and was found to have acute hypoxic respiratory failure of multifactorial etiology including COVID-19 pneumonia and acute on chronic CHF exacerbation with worsening renal function.  Hospital course complicated by persistently high oxygen requirements related to multifocal Covid infection and acute on chronic CHF exacerbation  Subjective  Mr.  Hudler today has persistent shortness of breath, having difficulty swallowing.  No chest pain.  Minimal cough.   A & P  1. Acute hypoxic respiratory failure, multifactorial, stable-combined etiology related to Covid pneumonia and CHF exacerbation, stable on3- 4L down from 8 L earlier in hospital stay.  w Continue COVID treatment.  Back on oral torsemide  2. Covid pneumonia, concern for worsening disease repeat CXR shows slight interval progression of airspace opacities.  Ferritin and d-dimer have increased slightly  (429--->501--> 537; 2.16-->2.4 respectively),  CRP 4.8 down from 7.1, white count still climbing now 16 from 13.8, remains afebrile, on stable 3-4 L O2. Needs tod ontinue to monitor serial inflammatory markers, continue Decadron, remdesivir, vitamin C, zinc.  Discussed with cardiology no plans for further intervention, will initiate [facility] transfer   3. Acute on chronic CHF with reduced ejection fraction exacerbation, slightly improving.  O2  requirements better on 3-4 L, Cardiology recommended transition from IV lasix to torsemide regimen today given improvement. Net negative 1.1 L in last 24 hours , Not a candidate for advanced heart failure options per cardiologyno plan for invasive intervention from their standpoint and ok to transfer to [place], palliative consulted.   4. AKI on CKD stage IV,stable. likely prerenal in setting of CHF exacerbation, baseline cr prior to admission 1.7-2. Has been 2.7-3 this admission, 3.10 today, avoid nephrotoxins, monitor BMP  5. Dysphagia. He complains of hard time eating. Speech eval ordered.  Currently on puree diet  6. Hypokalemia, in the setting of active diuresis improved with repletion,  BMP in a.m., check mag as well  7. Macrocytic anemia, stable.  Hemoglobin stable at around 10, no signs of bleeding, continue home vitamin B12  8. CAD status post CABG (2002).  Asymptomatic.  Troponin trend flat neck not consistent with ACS, most likely related to Covid  9. HTN, currently normotensive.  Continue carvedilol  10. Gout, stable continue allopurinol  11. HLD, stable continue Lipitor     Family Communication  :  Spoke with daughter [name] at [phone] and gave an update on 11/13-please update daily. she's aware of his guarded prognosis  Code Status :  DNR  Disposition Plan  :  Continue as inpatient needing continued IV diuresis for CHF exacerbation and remesdevir for COVID and persistent oxygen requirement, plan to deescalate to oral lasix in 24 hours pending clinical improvement, no plans for invasive intervention will initiate transfer to [place]  :  Cardiology  Procedures  :  none  DVT Prophylaxis  :  Heparin   Lab Results  Component Value Date   PLT  260 01/24/2019    Diet :  Diet Order            Diet Heart Room service appropriate? Yes; Fluid consistency: Thin  Diet effective now               Inpatient Medications Scheduled Meds: . allopurinol   100 mg Oral QHS  . aspirin EC  81 mg Oral Daily  . atorvastatin  40 mg Oral q1800  . carvedilol  3.125 mg Oral BID  . dexamethasone  6 mg Oral Daily  . docusate sodium  100 mg Oral Daily  . heparin  5,000 Units Subcutaneous Q8H  . megestrol  400 mg Oral Daily  . multivitamin with minerals  1 tablet Oral Daily  . potassium chloride  10 mEq Oral Daily  . sodium chloride flush  3 mL Intravenous Q12H  . sodium chloride flush  3 mL Intravenous Q12H  . torsemide  40 mg Oral BID  . cyanocobalamin  100 mcg Oral Daily  . vitamin C  500 mg Oral Daily  . zinc sulfate  220 mg Oral Daily   Continuous Infusions: . sodium chloride    . remdesivir 100 mg in NS 250 mL 100 mg (01/24/19 1028)   PRN Meds:.sodium chloride, acetaminophen, albuterol, chlorpheniramine-HYDROcodone, guaiFENesin-dextromethorphan, lactulose, nitroGLYCERIN, ondansetron (ZOFRAN) IV, ondansetron, sodium chloride flush  Antibiotics  :   Anti-infectives (From admission, onward)   Start     Dose/Rate Route Frequency Ordered Stop   01/22/19 1000  remdesivir 100 mg in sodium chloride 0.9 % 250 mL IVPB     100 mg 500 mL/hr over 30 Minutes Intravenous Every 24 hours 01/21/19 0330 01/26/19 0959   01/21/19 0530  remdesivir 200 mg in sodium chloride 0.9 % 250 mL IVPB     200 mg 500 mL/hr over 30 Minutes Intravenous Once 01/21/19 0330 01/21/19 1900       Objective   Vitals:   01/23/19 2000 01/23/19 2341 01/24/19 0500 01/24/19 0700  BP: 123/80 125/70  128/71  Pulse: 90 87  90  Resp: (!) 25 (!) 21  13  Temp: (!) 97 F (36.1 C) (!) 97.5 F (36.4 C)  97.7 F (36.5 C)  TempSrc: Axillary Axillary  Axillary  SpO2: 97% 98%  100%  Weight:   69 kg   Height:        SpO2: 100 % O2 Flow Rate (L/min): 4 L/min  Wt Readings from Last 3 Encounters:  01/24/19 69 kg  01/17/19 72.8 kg  12/05/18 73.9 kg     Intake/Output Summary (Last 24 hours) at 01/24/2019 1437 Last data filed at 01/24/2019 1000 Gross per 24 hour  Intake 615  ml  Output 2000 ml  Net -1385 ml    Physical Exam:  Awake Alert, Oriented X 3, Normal affect No new F.N deficits,  .AT, JVD noted(improved) Symmetrical Chest wall movement, no rhonchi ( much improved from prior exam), crackles at bases, no respiratory distress on 3-4 LNC Minimal non-pitting ankle edema bilaterally RRR,No Gallops,Rubs or new Murmurs,  +ve B.Sounds, Abd Soft, No tenderness,  No rebound, guarding or rigidity. No Cyanosis,    I have personally reviewed the following:   Data Reviewed:  CBC Recent Labs  Lab 01/27/2019 1847 01/21/19 0027 01/21/19 0447 01/22/19 0322 01/23/19 0413 01/24/19 0921  WBC 6.2 7.1 8.2 7.4 13.8* 16.6*  HGB 10.3* 10.6* 10.8* 9.9* 11.0* 11.1*  HCT 31.1* 32.8* 33.2* 30.1* 32.7* 33.4*  PLT 148* 155 163 170 218 260  MCV 104.7* 107.5* 106.4* 105.6* 102.5* 104.0*  MCH 34.7* 34.8* 34.6* 34.7* 34.5* 34.6*  MCHC 33.1 32.3 32.5 32.9 33.6 33.2  RDW 15.5 15.7* 15.8* 15.6* 15.5 15.8*  LYMPHSABS 0.4*  --  0.5* 0.5* 0.8 0.9  MONOABS 0.3  --  0.3 0.2 0.4 0.7  EOSABS 0.0  --  0.0 0.0 0.0 0.0  BASOSABS 0.0  --  0.0 0.0 0.0 0.0    Chemistries  Recent Labs  Lab 01/30/2019 1847 01/21/19 0027 01/21/19 0447 01/22/19 0322 01/23/19 0413 01/24/19 0921  NA 140  --  141 142 141 140  K 3.2*  --  3.2* 3.5 3.9 4.1  CL 101  --  102 104 102 99  CO2 24  --  25 26 24 24   GLUCOSE 111*  --  107* 166* 123* 155*  BUN 64*  --  62* 75* 88* 104*  CREATININE 3.01* 2.78* 2.85* 2.94* 3.07* 3.10*  CALCIUM 8.3*  --  8.4* 8.5* 8.7* 8.6*  MG  --   --  1.6* 1.8 1.9 2.1  AST 45*  --  39 33 45* 51*  ALT 37  --  34 30 35 38  ALKPHOS 123  --  124 116 138* 154*  BILITOT 2.0*  --  2.7* 1.9* 1.8* 2.5*   ------------------------------------------------------------------------------------------------------------------ No results for input(s): CHOL, HDL, LDLCALC, TRIG, CHOLHDL, LDLDIRECT in the last 72 hours.  No results found for: HGBA1C  ------------------------------------------------------------------------------------------------------------------ No results for input(s): TSH, T4TOTAL, T3FREE, THYROIDAB in the last 72 hours.  Invalid input(s): FREET3 ------------------------------------------------------------------------------------------------------------------ Recent Labs    01/23/19 0413 01/24/19 0921  FERRITIN 501* 537*    Coagulation profile No results for input(s): INR, PROTIME in the last 168 hours.  Recent Labs    01/23/19 0413 01/24/19 0921  DDIMER 2.16* 2.40*    Cardiac Enzymes No results for input(s): CKMB, TROPONINI, MYOGLOBIN in the last 168 hours.  Invalid input(s): CK ------------------------------------------------------------------------------------------------------------------    Component Value Date/Time   BNP 3,833.7 (H) 02/08/2019 1837    Micro Results Recent Results (from the past 240 hour(s))  SARS CORONAVIRUS 2 (TAT 6-24 HRS) Nasopharyngeal Nasopharyngeal Swab     Status: Abnormal   Collection Time: 01/21/2019  7:18 PM   Specimen: Nasopharyngeal Swab  Result Value Ref Range Status   SARS Coronavirus 2 POSITIVE (A) NEGATIVE Final    Comment: RESULT CALLED TO, READ BACK BY AND VERIFIED WITH: JAMES RAMOS RN.@[email or web address]  ON 11.10.2020 BY TCALDWELL MT. (NOTE) SARS-CoV-2 target nucleic acids are DETECTED. The SARS-CoV-2 RNA is generally detectable in upper and lower respiratory specimens during the acute phase of infection. Positive results are indicative of active infection with SARS-CoV-2. Clinical  correlation with patient history and other diagnostic information is necessary to determine patient infection status. Positive results do  not rule out bacterial infection or co-infection with other viruses. The expected result is Negative. Fact Sheet for Patients: SugarRoll.be Fact Sheet for Healthcare Providers: https://www.woods-mathews.com/  This test is not yet approved or cleared by the [place] FDA and  has been authorized for detection and/or diagnosis of SARS-CoV-2 by FDA under an Emergency Use Authorization (EUA). This EUA will remain  in effect (meaning this test ca n be used) for the duration of the COVID-19 declaration under Section 564(b)(1) of the Act, 21 U.S.C. section 360bbb-3(b)(1), unless the authorization is terminated or revoked sooner. Performed at [facility], [place] [address] [place], [place] [address]     Radiology Reports Dg Chest [facility] 1 View  Result  Date: 01/22/2019 CLINICAL DATA:  Shortness of breath, COVID-19 positive EXAM: PORTABLE CHEST 1 VIEW COMPARISON:  02/04/2019 FINDINGS: Postsurgical changes again noted to the sternum and mediastinum. Mild cardiomegaly. Calcific aortic knob. Patchy bilateral airspace opacities with slight interval progression when compared to the prior study. No pneumothorax. IMPRESSION: Patchy bilateral airspace opacities with slight interval progression when compared to the prior study. Electronically Signed   By: Davina Poke M.D.   On: 01/22/2019 11:49   Dg Chest Port 1 View  Result Date: 01/12/2019 CLINICAL DATA:  Shortness of breath EXAM: PORTABLE CHEST 1 VIEW COMPARISON:  12/19/2017 FINDINGS: Post sternotomy changes. Apices are obscured by the patient's neck and chin. Mild cardiomegaly. Interim development of interstitial and peripheral ground-glass opacity and consolidations. No pneumothorax. IMPRESSION: Interim development of diffuse bilateral interstitial and ground-glass opacities and patchy consolidations, some of which are peripheral in distribution. Findings suspect for multifocal infection, particularly atypical or viral pneumonia. Electronically Signed   By: Donavan Foil M.D.   On: 02/02/2019 19:10     Time Spent in minutes  30     Desiree Hane M.D on 01/24/2019 at 2:37 PM  To page go to [email or web address] - password [email or web address]

## 2019-01-25 DIAGNOSIS — U071 COVID-19: Principal | ICD-10-CM

## 2019-01-25 DIAGNOSIS — J1289 Other viral pneumonia: Secondary | ICD-10-CM

## 2019-01-25 LAB — CBC
HCT: 33.5 % — ABNORMAL LOW (ref 39.0–52.0)
Hemoglobin: 10.9 g/dL — ABNORMAL LOW (ref 13.0–17.0)
MCH: 34.1 pg — ABNORMAL HIGH (ref 26.0–34.0)
MCHC: 32.5 g/dL (ref 30.0–36.0)
MCV: 104.7 fL — ABNORMAL HIGH (ref 80.0–100.0)
Platelets: 251 10*3/uL (ref 150–400)
RBC: 3.2 MIL/uL — ABNORMAL LOW (ref 4.22–5.81)
RDW: 15.8 % — ABNORMAL HIGH (ref 11.5–15.5)
WBC: 17.9 10*3/uL — ABNORMAL HIGH (ref 4.0–10.5)
nRBC: 0.3 % — ABNORMAL HIGH (ref 0.0–0.2)

## 2019-01-31 ENCOUNTER — Ambulatory Visit: Payer: Medicare Other | Admitting: Interventional Cardiology

## 2019-02-05 ENCOUNTER — Ambulatory Visit: Payer: Medicare Other | Admitting: Interventional Cardiology

## 2019-02-11 NOTE — Death Summary Note (Signed)
DEATH SUMMARY   Patient Details  Name: Danny [name] MRN: [id number] DOB: 1926-08-23  Admission/Discharge Information   Admit Date:  2019-02-11  Date of Death: Date of Death: 16-Feb-2019  Time of Death: Time of Death: 0605  Length of Stay: 5  Referring Physician: Lavone Orn, MD   Reason(s) for Hospitalization  Pneumonia due to COVID-19 Acute on chronic systolic CHF Acute kidney injury  Diagnoses  Preliminary cause of death: Pneumonia due to COVID-19  Secondary Diagnoses (including complications and co-morbidities):    Acute CHF (congestive heart failure) ([place])   Hypokalemia   Essential hypertension, benign   Coronary artery disease involving coronary bypass graft of native heart with angina pectoris ([place])   CKD (chronic kidney disease) stage 4, GFR 15-29 ml/min ([facility])   Hyperlipidemia   History of bilateral carotid endarterectomy   Gout   Anemia of chronic disease   COVID-19   Acute respiratory failure with hypoxia [facility])   Dysphagia   Brief Hospital Course (including significant findings, care, treatment, and services provided and events leading to death)   Brief HPI   Danny [name] is a 85 y.o. year old male with medical history significant for systolic dysfunction CHF with EF of 15%, history of CVA, chronic kidney disease stage III, coronary artery disease, hyperlipidemia, hypertension, peripheral vascular disease who presented on Feb 11, 2019 with progressive shortness of breath, cough, fatigue and chest pain and was found to have acute hypoxic respiratory failure of multifactorial etiology including COVID-19 pneumonia and acute on chronic CHF exacerbation with worsening renal function.  Hospital course complicated by persistently high oxygen requirements related to multifocal Covid infection and acute on chronic CHF exacerbation  Hospital course  1. Acute hypoxic respiratory failure, multifactorial, stable-combined etiology related to Covid pneumonia and CHF  exacerbation.  2. Covid pneumonia, concern for worsening disease  patient was placed on steroids and remdesivir.  His inflammatory markers were being monitored.  D-dimer and ferritin levels were being monitored.    3. Acute on chronic CHF with reduced ejection fraction exacerbation, slightly improving.    Patient was placed on intravenous diuretics.  Cardiology was consulted.  He was not a candidate for advanced heart failure treatments or any invasive interventions.  Palliative medicine was consulted.     4. AKI on CKD stage IV. Likely prerenal in setting of CHF exacerbation, baseline cr prior to admission 1.7-2.  Renal function progressively worsened in the setting of IV diuretics.  5. Dysphagia.   6. Hypokalemia, in the setting of active diuresis  7. Macrocytic anemia, stable  8. CAD status post CABG 2000/06/24).    9. Essential hypertension   10. Gout  11. Hyperlipidemia  Patient was transferred over to [facility] for further monitoring and management of his Covid pneumonia.  However on the early morning hours of 02/16/23 he was found to be pulseless.  He was already a DNR.  He was pronounced dead at 6:05 AM on Feb 16, 2019.   Pertinent Labs and Studies  Significant Diagnostic Studies Dg Chest Port 1 View  Result Date: 01/22/2019 CLINICAL DATA:  Shortness of breath, COVID-19 positive EXAM: PORTABLE CHEST 1 VIEW COMPARISON:  02-11-19 FINDINGS: Postsurgical changes again noted to the sternum and mediastinum. Mild cardiomegaly. Calcific aortic knob. Patchy bilateral airspace opacities with slight interval progression when compared to the prior study. No pneumothorax. IMPRESSION: Patchy bilateral airspace opacities with slight interval progression when compared to the prior study. Electronically Signed   By: Davina Poke M.D.   On:  01/22/2019 11:49   Dg Chest Port 1 View  Result Date: 01/26/2019 CLINICAL DATA:  Shortness of breath EXAM: PORTABLE CHEST 1 VIEW  COMPARISON:  12/19/2017 FINDINGS: Post sternotomy changes. Apices are obscured by the patient's neck and chin. Mild cardiomegaly. Interim development of interstitial and peripheral ground-glass opacity and consolidations. No pneumothorax. IMPRESSION: Interim development of diffuse bilateral interstitial and ground-glass opacities and patchy consolidations, some of which are peripheral in distribution. Findings suspect for multifocal infection, particularly atypical or viral pneumonia. Electronically Signed   By: Donavan Foil M.D.   On: 01/19/2019 19:10    Microbiology Recent Results (from the past 240 hour(s))  SARS CORONAVIRUS 2 (TAT 6-24 HRS) Nasopharyngeal Nasopharyngeal Swab     Status: Abnormal   Collection Time: 01/15/2019  7:18 PM   Specimen: Nasopharyngeal Swab  Result Value Ref Range Status   SARS Coronavirus 2 POSITIVE (A) NEGATIVE Final    Comment: RESULT CALLED TO, READ BACK BY AND VERIFIED WITH: JAMES RAMOS RN.@[email or web address]  ON 11.10.2020 BY TCALDWELL MT. (NOTE) SARS-CoV-2 target nucleic acids are DETECTED. The SARS-CoV-2 RNA is generally detectable in upper and lower respiratory specimens during the acute phase of infection. Positive results are indicative of active infection with SARS-CoV-2. Clinical  correlation with patient history and other diagnostic information is necessary to determine patient infection status. Positive results do  not rule out bacterial infection or co-infection with other viruses. The expected result is Negative. Fact Sheet for Patients: SugarRoll.be Fact Sheet for Healthcare Providers: https://www.woods-mathews.com/ This test is not yet approved or cleared by the [place] FDA and  has been authorized for detection and/or diagnosis of SARS-CoV-2 by FDA under an Emergency Use Authorization (EUA). This EUA will remain  in effect (meaning this test ca n be used) for the duration of the COVID-19 declaration under  Section 564(b)(1) of the Act, 21 U.S.C. section 360bbb-3(b)(1), unless the authorization is terminated or revoked sooner. Performed at [facility], [place] [address], [place], [place] [address]   Respiratory Panel by PCR     Status: None   Collection Time: 01/24/19  6:24 AM   Specimen: Nasopharyngeal Swab; Respiratory  Result Value Ref Range Status   Adenovirus NOT DETECTED NOT DETECTED Final   Coronavirus 229E NOT DETECTED NOT DETECTED Final    Comment: (NOTE) The Coronavirus on the Respiratory Panel, DOES NOT test for the novel  Coronavirus (2019 nCoV)    Coronavirus HKU1 NOT DETECTED NOT DETECTED Final   Coronavirus NL63 NOT DETECTED NOT DETECTED Final   Coronavirus OC43 NOT DETECTED NOT DETECTED Final   Metapneumovirus NOT DETECTED NOT DETECTED Final   Rhinovirus / Enterovirus NOT DETECTED NOT DETECTED Final   Influenza A NOT DETECTED NOT DETECTED Final   Influenza B NOT DETECTED NOT DETECTED Final   Parainfluenza Virus 1 NOT DETECTED NOT DETECTED Final   Parainfluenza Virus 2 NOT DETECTED NOT DETECTED Final   Parainfluenza Virus 3 NOT DETECTED NOT DETECTED Final   Parainfluenza Virus 4 NOT DETECTED NOT DETECTED Final   Respiratory Syncytial Virus NOT DETECTED NOT DETECTED Final   Bordetella pertussis NOT DETECTED NOT DETECTED Final   Chlamydophila pneumoniae NOT DETECTED NOT DETECTED Final   Mycoplasma pneumoniae NOT DETECTED NOT DETECTED Final    Comment: Performed at [facility], [place]. [address], [place], [place] [address]    Lab Basic Metabolic Panel: Recent Labs  Lab 01/12/2019 1847 01/21/19 0027 01/21/19 0447 01/22/19 0322 01/23/19 0413 01/24/19 0921  NA 140  --  141 142  141 140  K 3.2*  --  3.2* 3.5 3.9 4.1  CL 101  --  102 104 102 99  CO2 24  --  25 26 24 24   GLUCOSE 111*  --  107* 166* 123* 155*  BUN 64*  --  62* 75* 88* 104*  CREATININE 3.01* 2.78* 2.85* 2.94* 3.07* 3.10*  CALCIUM 8.3*  --  8.4* 8.5* 8.7* 8.6*  MG  --   --  1.6* 1.8 1.9 2.1   PHOS  --   --  3.5 3.0 3.7 4.7*   Liver Function Tests: Recent Labs  Lab 01/13/2019 1847 01/21/19 0447 01/22/19 0322 01/23/19 0413 01/24/19 0921  AST 45* 39 33 45* 51*  ALT 37 34 30 35 38  ALKPHOS 123 124 116 138* 154*  BILITOT 2.0* 2.7* 1.9* 1.8* 2.5*  PROT 6.1* 6.1* 5.4* 6.4* 6.4*  ALBUMIN 2.7* 2.6* 2.3* 2.7* 2.8*   CBC: Recent Labs  Lab 01/13/2019 1847  01/21/19 0447 01/22/19 0322 01/23/19 0413 01/24/19 0921 01-27-2019 0142  WBC 6.2   < > 8.2 7.4 13.8* 16.6* 17.9*  NEUTROABS 5.5  --  7.3 6.7 12.9* 14.3*  --   HGB 10.3*   < > 10.8* 9.9* 11.0* 11.1* 10.9*  HCT 31.1*   < > 33.2* 30.1* 32.7* 33.4* 33.5*  MCV 104.7*   < > 106.4* 105.6* 102.5* 104.0* 104.7*  PLT 148*   < > 163 170 218 260 251   < > = values in this interval not displayed.   Sepsis Labs: Recent Labs  Lab 01/22/19 0322 01/23/19 0413 01/24/19 0921 01/27/2019 0142  WBC 7.4 13.8* 16.6* 17.9*    [place] Sarinana 01-27-19, 9:04 AM

## 2019-02-11 NOTE — Progress Notes (Signed)
At 6:00 am the pt was found  breathless and pulseless, hr was asystole on the monitor, last set of v/s  123/67, 82, 16,90 % on 5l [place] around 4:00 / 5:00am. Made Lilla Shook aware who is the pt's daughter. Paged MD Esau Grew and made aware,  MD at the bedside at this time, two nures pronounced the patient at 6:05 [place] , which was confirmed by placing pt on the heart monitor, listing for breath and  heart sounds which none was noted at this time. Ac made aware and [place] donor  called. The only personal belongings was a sliver watch, which the daughter will pick up at the funeral home.

## 2019-02-11 DEATH — deceased

## 2020-09-17 IMAGING — DX DG CHEST 1V PORT
1 series · 1 of 1 positions shown · non-contrast
Comparison: 08/24/2011

CLINICAL DATA: Near syncopal episode

EXAM:
PORTABLE CHEST 1 VIEW

[chest]
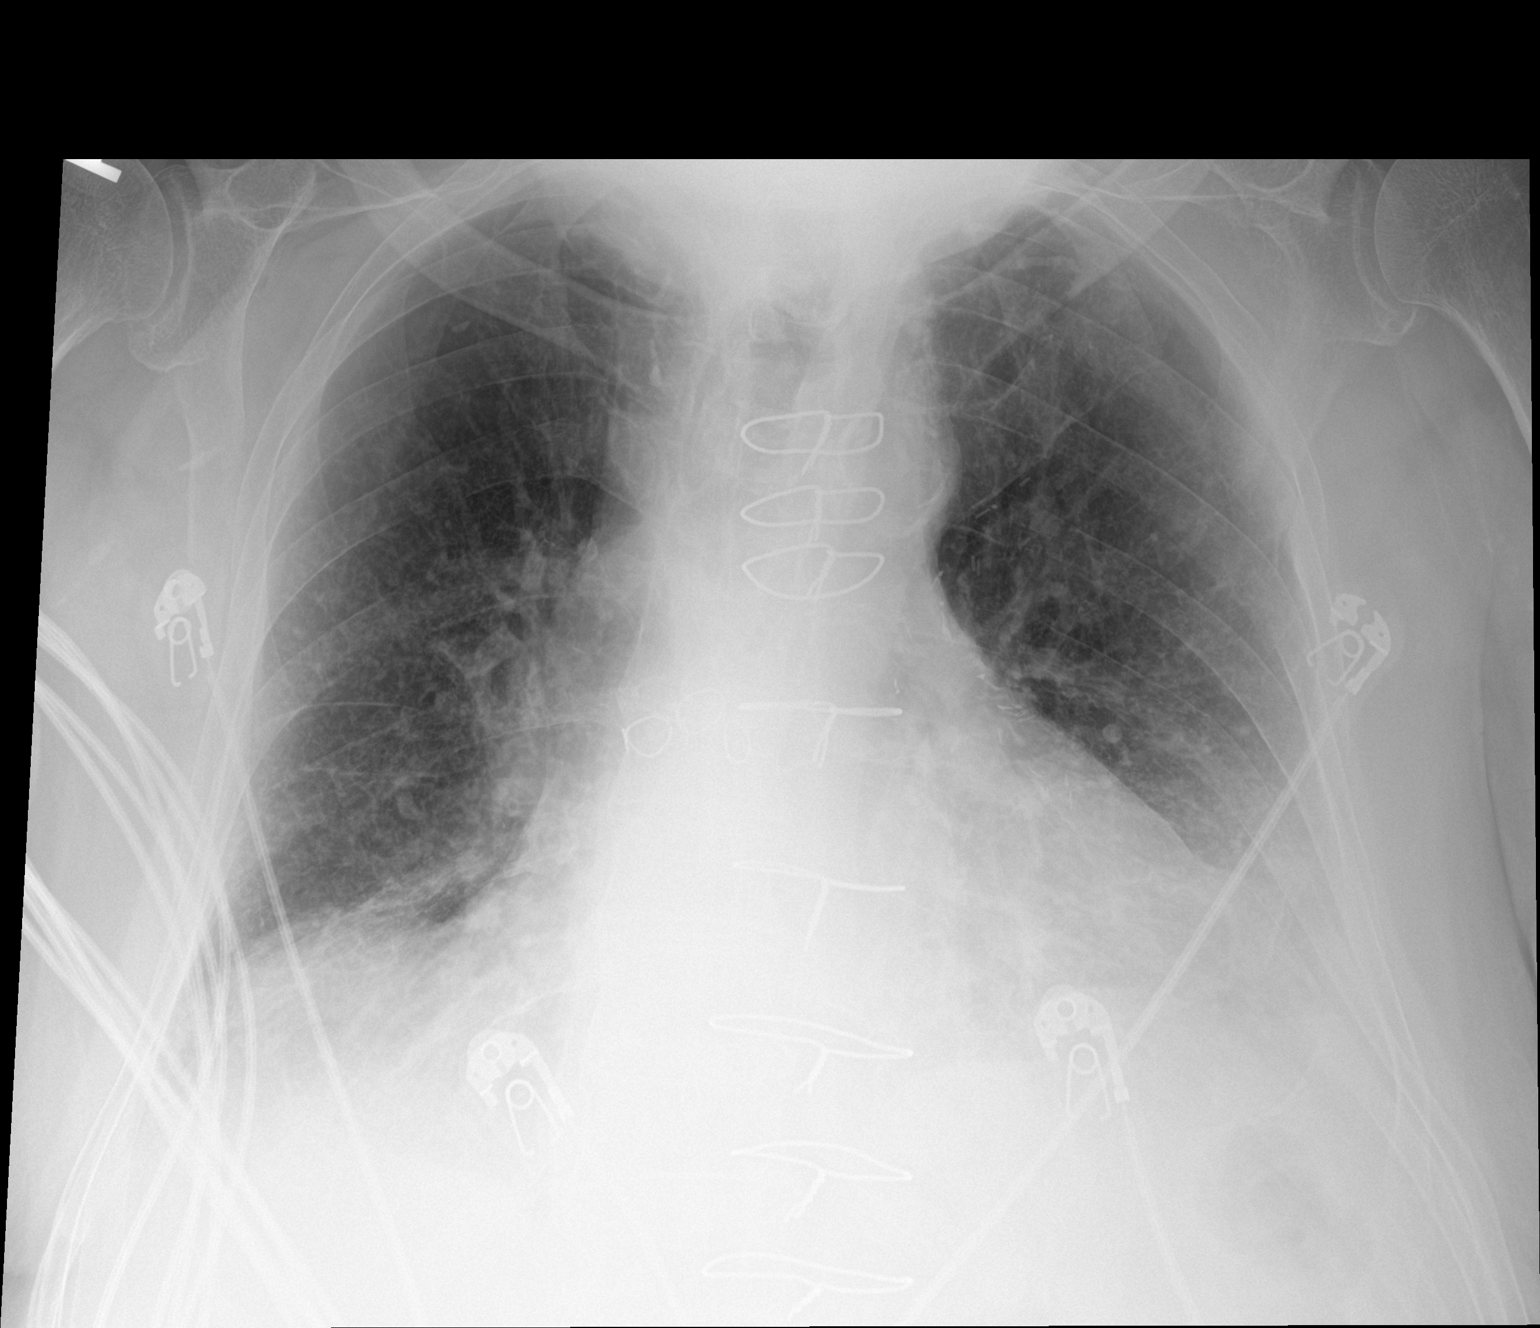

[1 of 1 positions shown; findings below may reference images not displayed]

FINDINGS: Cardiomegaly with post CABG change and moderate aortic
atherosclerosis. Bibasilar atelectasis. No overt pulmonary edema.
Mild emphysematous hyperinflation of the lungs, upper lobe
predominant. Resultant crowding of lower lobe interstitial lung
markings. No apparent effusion or pneumothorax. The patient's chin
obscures portions of the lung apices medially. No acute osseous
abnormality.
IMPRESSION: 1. Upper lobe predominant emphysematous disease with crowding of
lower lobe interstitial lung markings.
2. Bibasilar atelectasis noted.
3. Stable cardiomegaly with aortic atherosclerosis and post CABG
change.
